# Patient Record
Sex: Female | Born: 1980
Health system: Southern US, Community
[De-identification: ages and names within clinical notes are randomized; demographics above are authoritative.]

## PROBLEM LIST (undated history)

## (undated) DIAGNOSIS — R7303 Prediabetes: Secondary | ICD-10-CM

## (undated) DIAGNOSIS — I10 Essential (primary) hypertension: Secondary | ICD-10-CM

## (undated) DIAGNOSIS — L732 Hidradenitis suppurativa: Secondary | ICD-10-CM

## (undated) DIAGNOSIS — J45909 Unspecified asthma, uncomplicated: Secondary | ICD-10-CM

## (undated) DIAGNOSIS — R12 Heartburn: Secondary | ICD-10-CM

## (undated) DIAGNOSIS — E669 Obesity, unspecified: Secondary | ICD-10-CM

## (undated) DIAGNOSIS — M545 Low back pain, unspecified: Secondary | ICD-10-CM

## (undated) DIAGNOSIS — M7989 Other specified soft tissue disorders: Secondary | ICD-10-CM

## (undated) HISTORY — DX: Obesity, unspecified: E66.9

## (undated) HISTORY — DX: Unspecified asthma, uncomplicated: J45.909

## (undated) HISTORY — DX: Prediabetes: R73.03

## (undated) HISTORY — DX: Other specified soft tissue disorders: M79.89

## (undated) HISTORY — DX: Heartburn: R12

## (undated) HISTORY — DX: Low back pain, unspecified: M54.50

## (undated) HISTORY — PX: OTHER SURGICAL HISTORY: SHX169

## (undated) HISTORY — DX: Essential (primary) hypertension: I10

## (undated) HISTORY — DX: Hidradenitis suppurativa: L73.2

---

## 2012-05-21 ENCOUNTER — Ambulatory Visit (INDEPENDENT_AMBULATORY_CARE_PROVIDER_SITE_OTHER): Payer: BC Managed Care – PPO | Admitting: Family

## 2012-05-21 ENCOUNTER — Encounter: Payer: Self-pay | Admitting: Family

## 2012-05-21 VITALS — BP 136/86 | HR 92 | Temp 98.2°F | Resp 16 | Ht 69.0 in | Wt 282.1 lb

## 2012-05-21 DIAGNOSIS — I10 Essential (primary) hypertension: Secondary | ICD-10-CM

## 2012-05-21 DIAGNOSIS — Z23 Encounter for immunization: Secondary | ICD-10-CM

## 2012-05-21 LAB — BASIC METABOLIC PANEL
CO2: 25 mEq/L (ref 19–32)
Chloride: 105 mEq/L (ref 96–112)
Glucose, Bld: 80 mg/dL (ref 70–99)
Potassium: 4.2 mEq/L (ref 3.5–5.3)
Sodium: 139 mEq/L (ref 135–145)

## 2012-05-21 NOTE — Progress Notes (Signed)
  Subjective:    Patient ID: Amy Sutton, female    DOB: 11/12/1980, 31 y.o.   MRN: [id number]  HPI  [name] is a 31 yr old female who presents today to establish care.  She is the daughter of Amy Sutton who is a patient of mine.  She would like to discuss her HTN.  She reports that she has been following at the [facility] clinic for htn and is maintained on lisinopril-hctz. She was diagnosed with hypertension a few years ago.  Denies associated CP/SOB/Swelling. Review of Systems  Constitutional: Negative for unexpected weight change.  HENT: Positive for rhinorrhea.   Eyes: Positive for itching.  Respiratory: Negative for cough and shortness of breath.   Cardiovascular: Negative for chest pain.  Gastrointestinal: Negative for nausea, vomiting and diarrhea.  Genitourinary: Negative for urgency and frequency.       Last period was April of this year.  Has implanon.  Musculoskeletal: Negative for joint swelling and arthralgias.  Skin: Negative for rash.  Neurological: Negative for headaches.  Hematological: Negative for adenopathy.  Psychiatric/Behavioral:       Denies depression/anxiety   Past Medical History  Diagnosis Date  . Hypertension     History   Social History  . Marital Status: Divorced    Spouse Name: N/A    Number of Children: 1  . Years of Education: N/A   Occupational History  .     Social History Main Topics  . Smoking status: Never Smoker   . Smokeless tobacco: Never Used  . Alcohol Use: 1.5 oz/week    3 drink(s) per week  . Drug Use: Not on file  . Sexually Active: Not on file   Other Topics Concern  . Not on file   Social History Narrative   Regular exercise:  Not currentlyCaffeine Use:  Sometimes7 yr old sonStudent at [employer].Lives with parents and son    History reviewed. No pertinent past surgical history.  Family History  Problem Relation Age of Onset  . Arthritis Mother   . Hypertension Mother   . Diabetes Mother   .  Hypertension Father   . Diabetes Father   . Cancer Maternal Uncle     prostate  . Cancer Paternal Aunt     No Known Allergies  Current Outpatient Prescriptions on File Prior to Visit  Medication Sig Dispense Refill  . lisinopril-hydrochlorothiazide (PRINZIDE,ZESTORETIC) 20-12.5 MG per tablet Take 2 tablets by mouth every morning.        BP 136/86  Pulse 92  Temp 98.2 F (36.8 C) (Oral)  Resp 16  Ht 5\' 9"  (1.753 m)  Wt 282 lb 2.1 oz (127.973 kg)  BMI 41.66 kg/m2  SpO2 99%        Objective:   Physical Exam  Constitutional: She appears well-developed and well-nourished. No distress.  HENT:  Head: Normocephalic and atraumatic.  Right Ear: Tympanic membrane and ear canal normal.  Left Ear: Tympanic membrane and ear canal normal.  Mouth/Throat: No posterior oropharyngeal edema or posterior oropharyngeal erythema.  Eyes: Conjunctivae normal are normal.  Cardiovascular: Normal rate and regular rhythm.   No murmur heard. Pulmonary/Chest: Effort normal and breath sounds normal.  Lymphadenopathy:    She has no cervical adenopathy.  Psychiatric: She has a normal mood and affect. Her behavior is normal. Judgment and thought content normal.          Assessment & Plan:

## 2012-05-21 NOTE — Patient Instructions (Addendum)
Please complete your lab work prior to leaving.  Schedule a fasting physical at the front desk.   Welcome to [facility]!

## 2012-05-22 ENCOUNTER — Encounter: Payer: Self-pay | Admitting: Family

## 2012-05-22 DIAGNOSIS — I10 Essential (primary) hypertension: Secondary | ICD-10-CM | POA: Insufficient documentation

## 2012-05-22 NOTE — Assessment & Plan Note (Signed)
Stable on lisinopril HCTZ.  Continue same, obtain bmet.

## 2012-05-24 ENCOUNTER — Telehealth: Payer: Self-pay | Admitting: Family

## 2012-05-24 NOTE — Telephone Encounter (Signed)
Received medical records from [facility]  P: [phone] F: [phone]

## 2012-05-30 ENCOUNTER — Encounter: Payer: Self-pay | Admitting: Family

## 2012-05-30 ENCOUNTER — Other Ambulatory Visit (HOSPITAL_COMMUNITY)
Admission: RE | Admit: 2012-05-30 | Discharge: 2012-05-30 | Disposition: A | Discharge status: Home / Self Care | Payer: BC Managed Care – PPO | Source: Ambulatory Visit | Attending: Family | Admitting: Family

## 2012-05-30 ENCOUNTER — Ambulatory Visit (INDEPENDENT_AMBULATORY_CARE_PROVIDER_SITE_OTHER): Payer: BC Managed Care – PPO | Admitting: Family

## 2012-05-30 VITALS — BP 118/74 | HR 94 | Temp 99.6°F | Resp 16 | Ht 69.0 in | Wt 279.1 lb

## 2012-05-30 DIAGNOSIS — Z0000 Encounter for general adult medical examination without abnormal findings: Secondary | ICD-10-CM | POA: Insufficient documentation

## 2012-05-30 DIAGNOSIS — Z01419 Encounter for gynecological examination (general) (routine) without abnormal findings: Secondary | ICD-10-CM | POA: Insufficient documentation

## 2012-05-30 LAB — CBC WITH DIFFERENTIAL/PLATELET
Basophils Absolute: 0 10*3/uL (ref 0.0–0.1)
Basophils Relative: 0 % (ref 0–1)
Eosinophils Relative: 1 % (ref 0–5)
HCT: 39.8 % (ref 36.0–46.0)
Hemoglobin: 13.3 g/dL (ref 12.0–15.0)
MCHC: 33.4 g/dL (ref 30.0–36.0)
MCV: 79.6 fL (ref 78.0–100.0)
Monocytes Absolute: 0.4 10*3/uL (ref 0.1–1.0)
Monocytes Relative: 6 % (ref 3–12)
RDW: 13.6 % (ref 11.5–15.5)

## 2012-05-30 LAB — LIPID PANEL
Cholesterol: 178 mg/dL (ref 0–200)
Total CHOL/HDL Ratio: 4.6 Ratio
VLDL: 23 mg/dL (ref 0–40)

## 2012-05-30 LAB — HEPATIC FUNCTION PANEL
ALT: 12 U/L (ref 0–35)
AST: 12 U/L (ref 0–37)
Bilirubin, Direct: 0.1 mg/dL (ref 0.0–0.3)
Indirect Bilirubin: 0.4 mg/dL (ref 0.0–0.9)
Total Protein: 7.2 g/dL (ref 6.0–8.3)

## 2012-05-30 NOTE — Addendum Note (Signed)
Addended by: Mervin Kung A on: 05/30/2012 12:09 PM   Modules accepted: Orders

## 2012-05-30 NOTE — Progress Notes (Signed)
Subjective:    Patient ID: Amy Sutton, female    DOB: 11/25/1980, 31 y.o.   MRN: [id number]  HPI Patient presents today for complete physical.  Immunizations: Tdap/flu shot up to date Diet: Trying to eat healthy. Exercise: Not regularly.   Pap Smear: last pap 2011- regular pap smears- reports always normal.  Last period in April- irregular menses due to implanon.   Review of Systems  Constitutional: Negative for unexpected weight change.  HENT: Negative for hearing loss.   Eyes: Negative for visual disturbance.  Respiratory: Negative for shortness of breath and wheezing.   Cardiovascular: Negative for chest pain.  Gastrointestinal: Positive for constipation. Negative for nausea, vomiting and diarrhea.  Genitourinary: Negative for dysuria and frequency.  Musculoskeletal: Negative for myalgias and arthralgias.  Skin: Negative for rash.  Neurological: Negative for headaches.  Hematological: Negative for adenopathy.  Psychiatric/Behavioral:       Denies depression/anxiety       Past Medical History  Diagnosis Date  . Hypertension     History   Social History  . Marital Status: Divorced    Spouse Name: N/A    Number of Children: 1  . Years of Education: N/A   Occupational History  .     Social History Main Topics  . Smoking status: Never Smoker   . Smokeless tobacco: Never Used  . Alcohol Use: 1.5 oz/week    3 drink(s) per week  . Drug Use: Not on file  . Sexually Active: Not on file   Other Topics Concern  . Not on file   Social History Narrative   Regular exercise:  Not currentlyCaffeine Use:  Sometimes7 yr old sonStudent at [employer].Lives with parents and son    No past surgical history on file.  Family History  Problem Relation Age of Onset  . Arthritis Mother   . Hypertension Mother   . Diabetes Mother   . Hypertension Father   . Diabetes Father   . Cancer Maternal Uncle     prostate  . Cancer Paternal Aunt     No Known  Allergies  Current Outpatient Prescriptions on File Prior to Visit  Medication Sig Dispense Refill  . etonogestrel (IMPLANON) 68 MG IMPL implant Inject 1 each into the skin once. Per GYN      . lisinopril-hydrochlorothiazide (PRINZIDE,ZESTORETIC) 20-12.5 MG per tablet Take 2 tablets by mouth every morning.        BP 118/74  Pulse 94  Temp 99.6 F (37.6 C) (Oral)  Resp 16  Ht 5\' 9"  (1.753 m)  Wt 279 lb 1.3 oz (126.59 kg)  BMI 41.21 kg/m2  SpO2 96%    Objective:   Physical Exam Physical Exam  Constitutional: She is oriented to person, place, and time. She appears well-developed and well-nourished. No distress.  HENT:  Head: Normocephalic and atraumatic.  Right Ear: Tympanic membrane and ear canal normal.  Left Ear: Tympanic membrane and ear canal normal.  Mouth/Throat: Oropharynx is clear and moist.  Eyes: Pupils are equal, round, and reactive to light. No scleral icterus.  Neck: Normal range of motion. No thyromegaly present.  Cardiovascular: Normal rate and regular rhythm.   No murmur heard. Pulmonary/Chest: Effort normal and breath sounds normal. No respiratory distress. He has no wheezes. She has no rales. She exhibits no tenderness.  Abdominal: Soft. Bowel sounds are normal. He exhibits no distension and no mass. There is no tenderness. There is no rebound and no guarding.  Musculoskeletal: She exhibits no edema.  Lymphadenopathy:    She has no cervical adenopathy.  Neurological: She is alert and oriented to person, place, and time. She has normal reflexes. She exhibits normal muscle tone. Coordination normal.  Skin: Skin is warm and dry.  Psychiatric: She has a normal mood and affect. Her behavior is normal. Judgment and thought content normal.  Breasts: Examined lying Right: Without masses, retractions, discharge or axillary adenopathy.  Left: Without masses, retractions, discharge or axillary adenopathy.  Inguinal/mons: Normal without inguinal adenopathy  External  genitalia: Normal  BUS/Urethra/Skene's glands: Normal  Bladder: Normal  Vagina: Normal  Cervix: Normal  Uterus: normal in size, shape and contour. Midline and mobile  Adnexa/parametria:  Rt: Without masses or tenderness.  Lt: Without masses or tenderness.  Anus and perineum: Normal           Assessment & Plan:          Assessment & Plan:

## 2012-05-30 NOTE — Assessment & Plan Note (Signed)
Pt counseled on healthy diet, exercise.  Obtain fasting labs.  Pap performed.

## 2012-05-30 NOTE — Patient Instructions (Addendum)
Preventive Care for Adults, Female A healthy lifestyle and preventive care can promote health and wellness. Preventive health guidelines for women include the following key practices.  A routine yearly physical is a good way to check with your caregiver about your health and preventive screening. It is a chance to share any concerns and updates on your health, and to receive a thorough exam.   Visit your dentist for a routine exam and preventive care every 6 months. Brush your teeth twice a day and floss once a day. Good oral hygiene prevents tooth decay and gum disease.   The frequency of eye exams is based on your age, health, family medical history, use of contact lenses, and other factors. Follow your caregiver's recommendations for frequency of eye exams.   Eat a healthy diet. Foods like vegetables, fruits, whole grains, low-fat dairy products, and lean protein foods contain the nutrients you need without too many calories. Decrease your intake of foods high in solid fats, added sugars, and salt. Eat the right amount of calories for you.Get information about a proper diet from your caregiver, if necessary.   Regular physical exercise is one of the most important things you can do for your health. Most adults should get at least 150 minutes of moderate-intensity exercise (any activity that increases your heart rate and causes you to sweat) each week. In addition, most adults need muscle-strengthening exercises on 2 or more days a week.   Maintain a healthy weight. The body mass index (BMI) is a screening tool to identify possible weight problems. It provides an estimate of body fat based on height and weight. Your caregiver can help determine your BMI, and can help you achieve or maintain a healthy weight.For adults 20 years and older:   A BMI below 18.5 is considered underweight.   A BMI of 18.5 to 24.9 is normal.   A BMI of 25 to 29.9 is considered overweight.   A BMI of 30 and above is  considered obese.   Maintain normal blood lipids and cholesterol levels by exercising and minimizing your intake of saturated fat. Eat a balanced diet with plenty of fruit and vegetables. Blood tests for lipids and cholesterol should begin at age 20 and be repeated every 5 years. If your lipid or cholesterol levels are high, you are over 50, or you are at high risk for heart disease, you may need your cholesterol levels checked more frequently.Ongoing high lipid and cholesterol levels should be treated with medicines if diet and exercise are not effective.   If you smoke, find out from your caregiver how to quit. If you do not use tobacco, do not start.   If you are pregnant, do not drink alcohol. If you are breastfeeding, be very cautious about drinking alcohol. If you are not pregnant and choose to drink alcohol, do not exceed 1 drink per day. One drink is considered to be 12 ounces (355 mL) of beer, 5 ounces (148 mL) of wine, or 1.5 ounces (44 mL) of liquor.   Avoid use of street drugs. Do not share needles with anyone. Ask for help if you need support or instructions about stopping the use of drugs.   High blood pressure causes heart disease and increases the risk of stroke. Your blood pressure should be checked at least every 1 to 2 years. Ongoing high blood pressure should be treated with medicines if weight loss and exercise are not effective.   If you are 55 to 30   years old, ask your caregiver if you should take aspirin to prevent strokes.   Diabetes screening involves taking a blood sample to check your fasting blood sugar level. This should be done once every 3 years, after age 45, if you are within normal weight and without risk factors for diabetes. Testing should be considered at a younger age or be carried out more frequently if you are overweight and have at least 1 risk factor for diabetes.   Breast cancer screening is essential preventive care for women. You should practice "breast  self-awareness." This means understanding the normal appearance and feel of your breasts and may include breast self-examination. Any changes detected, no matter how small, should be reported to a caregiver. Women in their 20s and 30s should have a clinical breast exam (CBE) by a caregiver as part of a regular health exam every 1 to 3 years. After age 40, women should have a CBE every year. Starting at age 40, women should consider having a mammography (breast X-ray test) every year. Women who have a family history of breast cancer should talk to their caregiver about genetic screening. Women at a high risk of breast cancer should talk to their caregivers about having magnetic resonance imaging (MRI) and a mammography every year.   The Pap test is a screening test for cervical cancer. A Pap test can show cell changes on the cervix that might become cervical cancer if left untreated. A Pap test is a procedure in which cells are obtained and examined from the lower end of the uterus (cervix).   Women should have a Pap test starting at age 21.   Between ages 21 and 29, Pap tests should be repeated every 2 years.   Beginning at age 30, you should have a Pap test every 3 years as long as the past 3 Pap tests have been normal.   Some women have medical problems that increase the chance of getting cervical cancer. Talk to your caregiver about these problems. It is especially important to talk to your caregiver if a new problem develops soon after your last Pap test. In these cases, your caregiver may recommend more frequent screening and Pap tests.   The above recommendations are the same for women who have or have not gotten the vaccine for human papillomavirus (HPV).   If you had a hysterectomy for a problem that was not cancer or a condition that could lead to cancer, then you no longer need Pap tests. Even if you no longer need a Pap test, a regular exam is a good idea to make sure no other problems are  starting.   If you are between ages 65 and 70, and you have had normal Pap tests going back 10 years, you no longer need Pap tests. Even if you no longer need a Pap test, a regular exam is a good idea to make sure no other problems are starting.   If you have had past treatment for cervical cancer or a condition that could lead to cancer, you need Pap tests and screening for cancer for at least 20 years after your treatment.   If Pap tests have been discontinued, risk factors (such as a new sexual partner) need to be reassessed to determine if screening should be resumed.   The HPV test is an additional test that may be used for cervical cancer screening. The HPV test looks for the virus that can cause the cell changes on the cervix.   The cells collected during the Pap test can be tested for HPV. The HPV test could be used to screen women aged 30 years and older, and should be used in women of any age who have unclear Pap test results. After the age of 30, women should have HPV testing at the same frequency as a Pap test.   Colorectal cancer can be detected and often prevented. Most routine colorectal cancer screening begins at the age of 50 and continues through age 75. However, your caregiver may recommend screening at an earlier age if you have risk factors for colon cancer. On a yearly basis, your caregiver may provide home test kits to check for hidden blood in the stool. Use of a small camera at the end of a tube, to directly examine the colon (sigmoidoscopy or colonoscopy), can detect the earliest forms of colorectal cancer. Talk to your caregiver about this at age 50, when routine screening begins. Direct examination of the colon should be repeated every 5 to 10 years through age 75, unless early forms of pre-cancerous polyps or small growths are found.   Hepatitis C blood testing is recommended for all people born from 1945 through 1965 and any individual with known risks for hepatitis C.    Practice safe sex. Use condoms and avoid high-risk sexual practices to reduce the spread of sexually transmitted infections (STIs). STIs include gonorrhea, chlamydia, syphilis, trichomonas, herpes, HPV, and human immunodeficiency virus (HIV). Herpes, HIV, and HPV are viral illnesses that have no cure. They can result in disability, cancer, and death. Sexually active women aged 25 and younger should be checked for chlamydia. Older women with new or multiple partners should also be tested for chlamydia. Testing for other STIs is recommended if you are sexually active and at increased risk.   Osteoporosis is a disease in which the bones lose minerals and strength with aging. This can result in serious bone fractures. The risk of osteoporosis can be identified using a bone density scan. Women ages 65 and over and women at risk for fractures or osteoporosis should discuss screening with their caregivers. Ask your caregiver whether you should take a calcium supplement or vitamin D to reduce the rate of osteoporosis.   Menopause can be associated with physical symptoms and risks. Hormone replacement therapy is available to decrease symptoms and risks. You should talk to your caregiver about whether hormone replacement therapy is right for you.   Use sunscreen with sun protection factor (SPF) of 30 or more. Apply sunscreen liberally and repeatedly throughout the day. You should seek shade when your shadow is shorter than you. Protect yourself by wearing long sleeves, pants, a wide-brimmed hat, and sunglasses year round, whenever you are outdoors.   Once a month, do a whole body skin exam, using a mirror to look at the skin on your back. Notify your caregiver of new moles, moles that have irregular borders, moles that are larger than a pencil eraser, or moles that have changed in shape or color.   Stay current with required immunizations.   Influenza. You need a dose every fall (or winter). The composition of  the flu vaccine changes each year, so being vaccinated once is not enough.   Pneumococcal polysaccharide. You need 1 to 2 doses if you smoke cigarettes or if you have certain chronic medical conditions. You need 1 dose at age 65 (or older) if you have never been vaccinated.   Tetanus, diphtheria, pertussis (Tdap, Td). Get 1 dose of   Tdap vaccine if you are younger than age 65, are over 65 and have contact with an infant, are a healthcare worker, are pregnant, or simply want to be protected from whooping cough. After that, you need a Td booster dose every 10 years. Consult your caregiver if you have not had at least 3 tetanus and diphtheria-containing shots sometime in your life or have a deep or dirty wound.   HPV. You need this vaccine if you are a woman age 26 or younger. The vaccine is given in 3 doses over 6 months.   Measles, mumps, rubella (MMR). You need at least 1 dose of MMR if you were born in 1957 or later. You may also need a second dose.   Meningococcal. If you are age 19 to 21 and a first-year college student living in a residence hall, or have one of several medical conditions, you need to get vaccinated against meningococcal disease. You may also need additional booster doses.   Zoster (shingles). If you are age 60 or older, you should get this vaccine.   Varicella (chickenpox). If you have never had chickenpox or you were vaccinated but received only 1 dose, talk to your caregiver to find out if you need this vaccine.   Hepatitis A. You need this vaccine if you have a specific risk factor for hepatitis A virus infection or you simply wish to be protected from this disease. The vaccine is usually given as 2 doses, 6 to 18 months apart.   Hepatitis B. You need this vaccine if you have a specific risk factor for hepatitis B virus infection or you simply wish to be protected from this disease. The vaccine is given in 3 doses, usually over 6 months.  Preventive Services /  Frequency Ages 19 to 39  Blood pressure check.** / Every 1 to 2 years.   Lipid and cholesterol check.** / Every 5 years beginning at age 20.   Clinical breast exam.** / Every 3 years for women in their 20s and 30s.   Pap test.** / Every 2 years from ages 21 through 29. Every 3 years starting at age 30 through age 65 or 70 with a history of 3 consecutive normal Pap tests.   HPV screening.** / Every 3 years from ages 30 through ages 65 to 70 with a history of 3 consecutive normal Pap tests.   Hepatitis C blood test.** / For any individual with known risks for hepatitis C.   Skin self-exam. / Monthly.   Influenza immunization.** / Every year.   Pneumococcal polysaccharide immunization.** / 1 to 2 doses if you smoke cigarettes or if you have certain chronic medical conditions.   Tetanus, diphtheria, pertussis (Tdap, Td) immunization. / A one-time dose of Tdap vaccine. After that, you need a Td booster dose every 10 years.   HPV immunization. / 3 doses over 6 months, if you are 26 and younger.   Measles, mumps, rubella (MMR) immunization. / You need at least 1 dose of MMR if you were born in 1957 or later. You may also need a second dose.   Meningococcal immunization. / 1 dose if you are age 19 to 21 and a first-year college student living in a residence hall, or have one of several medical conditions, you need to get vaccinated against meningococcal disease. You may also need additional booster doses.   Varicella immunization.** / Consult your caregiver.   Hepatitis A immunization.** / Consult your caregiver. 2 doses, 6 to 18 months   apart.   Hepatitis B immunization.** / Consult your caregiver. 3 doses usually over 6 months.  Ages 40 to 64  Blood pressure check.** / Every 1 to 2 years.   Lipid and cholesterol check.** / Every 5 years beginning at age 20.   Clinical breast exam.** / Every year after age 40.   Mammogram.** / Every year beginning at age 40 and continuing for as  long as you are in good health. Consult with your caregiver.   Pap test.** / Every 3 years starting at age 30 through age 65 or 70 with a history of 3 consecutive normal Pap tests.   HPV screening.** / Every 3 years from ages 30 through ages 65 to 70 with a history of 3 consecutive normal Pap tests.   Fecal occult blood test (FOBT) of stool. / Every year beginning at age 50 and continuing until age 75. You may not need to do this test if you get a colonoscopy every 10 years.   Flexible sigmoidoscopy or colonoscopy.** / Every 5 years for a flexible sigmoidoscopy or every 10 years for a colonoscopy beginning at age 50 and continuing until age 75.   Hepatitis C blood test.** / For all people born from 1945 through 1965 and any individual with known risks for hepatitis C.   Skin self-exam. / Monthly.   Influenza immunization.** / Every year.   Pneumococcal polysaccharide immunization.** / 1 to 2 doses if you smoke cigarettes or if you have certain chronic medical conditions.   Tetanus, diphtheria, pertussis (Tdap, Td) immunization.** / A one-time dose of Tdap vaccine. After that, you need a Td booster dose every 10 years.   Measles, mumps, rubella (MMR) immunization. / You need at least 1 dose of MMR if you were born in 1957 or later. You may also need a second dose.   Varicella immunization.** / Consult your caregiver.   Meningococcal immunization.** / Consult your caregiver.   Hepatitis A immunization.** / Consult your caregiver. 2 doses, 6 to 18 months apart.   Hepatitis B immunization.** / Consult your caregiver. 3 doses, usually over 6 months.  Ages 65 and over  Blood pressure check.** / Every 1 to 2 years.   Lipid and cholesterol check.** / Every 5 years beginning at age 20.   Clinical breast exam.** / Every year after age 40.   Mammogram.** / Every year beginning at age 40 and continuing for as long as you are in good health. Consult with your caregiver.   Pap test.** /  Every 3 years starting at age 30 through age 65 or 70 with a 3 consecutive normal Pap tests. Testing can be stopped between 65 and 70 with 3 consecutive normal Pap tests and no abnormal Pap or HPV tests in the past 10 years.   HPV screening.** / Every 3 years from ages 30 through ages 65 or 70 with a history of 3 consecutive normal Pap tests. Testing can be stopped between 65 and 70 with 3 consecutive normal Pap tests and no abnormal Pap or HPV tests in the past 10 years.   Fecal occult blood test (FOBT) of stool. / Every year beginning at age 50 and continuing until age 75. You may not need to do this test if you get a colonoscopy every 10 years.   Flexible sigmoidoscopy or colonoscopy.** / Every 5 years for a flexible sigmoidoscopy or every 10 years for a colonoscopy beginning at age 50 and continuing until age 75.   Hepatitis   C blood test.** / For all people born from 1945 through 1965 and any individual with known risks for hepatitis C.   Osteoporosis screening.** / A one-time screening for women ages 65 and over and women at risk for fractures or osteoporosis.   Skin self-exam. / Monthly.   Influenza immunization.** / Every year.   Pneumococcal polysaccharide immunization.** / 1 dose at age 65 (or older) if you have never been vaccinated.   Tetanus, diphtheria, pertussis (Tdap, Td) immunization. / A one-time dose of Tdap vaccine if you are over 65 and have contact with an infant, are a healthcare worker, or simply want to be protected from whooping cough. After that, you need a Td booster dose every 10 years.   Varicella immunization.** / Consult your caregiver.   Meningococcal immunization.** / Consult your caregiver.   Hepatitis A immunization.** / Consult your caregiver. 2 doses, 6 to 18 months apart.   Hepatitis B immunization.** / Check with your caregiver. 3 doses, usually over 6 months.  ** Family history and personal history of risk and conditions may change your caregiver's  recommendations. Document Released: 10/18/2001 Document Revised: 08/11/2011 Document Reviewed: 01/17/2011 [employer] Patient Information 2012 [employer]. 

## 2012-05-31 ENCOUNTER — Encounter: Payer: Self-pay | Admitting: Family

## 2012-05-31 LAB — URINALYSIS, ROUTINE W REFLEX MICROSCOPIC
Bilirubin Urine: NEGATIVE
Leukocytes, UA: NEGATIVE
Protein, ur: NEGATIVE mg/dL
Specific Gravity, Urine: 1.017 (ref 1.005–1.030)
Urobilinogen, UA: 1 mg/dL (ref 0.0–1.0)

## 2012-05-31 LAB — TSH: TSH: 1.302 u[IU]/mL (ref 0.350–4.500)

## 2012-09-06 ENCOUNTER — Other Ambulatory Visit: Payer: Self-pay | Admitting: *Deleted

## 2012-09-06 MED ORDER — LISINOPRIL-HYDROCHLOROTHIAZIDE 20-12.5 MG PO TABS: 2.0000 | ORAL_TABLET | ORAL | Status: DC

## 2012-09-06 NOTE — Telephone Encounter (Signed)
Received message from pt requesting refill of lisinopril hctz. Refills sent and detailed message left on pt's cell#.

## 2012-10-08 ENCOUNTER — Ambulatory Visit (INDEPENDENT_AMBULATORY_CARE_PROVIDER_SITE_OTHER): Payer: BC Managed Care – PPO | Admitting: Family

## 2012-10-08 ENCOUNTER — Encounter: Payer: Self-pay | Admitting: Family

## 2012-10-08 VITALS — BP 120/76 | HR 106 | Temp 99.3°F | Resp 16 | Wt 287.1 lb

## 2012-10-08 DIAGNOSIS — S335XXA Sprain of ligaments of lumbar spine, initial encounter: Secondary | ICD-10-CM

## 2012-10-08 DIAGNOSIS — M545 Low back pain, unspecified: Secondary | ICD-10-CM | POA: Insufficient documentation

## 2012-10-08 DIAGNOSIS — G8929 Other chronic pain: Secondary | ICD-10-CM | POA: Insufficient documentation

## 2012-10-08 DIAGNOSIS — L732 Hidradenitis suppurativa: Secondary | ICD-10-CM

## 2012-10-08 DIAGNOSIS — S39012A Strain of muscle, fascia and tendon of lower back, initial encounter: Secondary | ICD-10-CM

## 2012-10-08 MED ORDER — MELOXICAM 7.5 MG PO TABS: 7.5000 mg | ORAL_TABLET | Freq: Every day | ORAL | Status: DC

## 2012-10-08 MED ORDER — CYCLOBENZAPRINE HCL 5 MG PO TABS: ORAL_TABLET | ORAL | Status: DC

## 2012-10-08 NOTE — Assessment & Plan Note (Signed)
Trial of meloxicam and HS flexeril.  Pt is instructed to contact us if symptoms worsen, if she develops leg weakness or if not improved in 1 month.

## 2012-10-08 NOTE — Assessment & Plan Note (Signed)
Currently stable. We discussed that there is no cure. She can apply warm compresses/take tubs as needed to promote drainage of cysts.  If cysts become sore/tender or enlarged she is instructed to be seen in our office as she may require abx.

## 2012-10-08 NOTE — Patient Instructions (Addendum)

## 2012-10-08 NOTE — Progress Notes (Signed)
  Subjective:    Patient ID: Amy Sutton, female    DOB: 1981-08-11, 32 y.o.   MRN: [id number]  HPI  Amy Sutton is a 32 yr old female who presents today with chief complaint of low back pain.  Pain is lon the left side.  Pain occurs after walking and has been present for approximately 1 month. Pain is worse with movement.  Occasionally radiates down the left leg, but generally, "stays in my back."  Recurrent skin infections- reports that she had a boil in the right groin a few days ago but it has now resolved. She reports that she is prone to boils in the groin and the axilla and wonders what she can do to prevent them.   Review of Systems See HPI  Past Medical History  Diagnosis Date  . Hypertension     History   Social History  . Marital Status: Divorced    Spouse Name: N/A    Number of Children: 1  . Years of Education: N/A   Occupational History  .     Social History Main Topics  . Smoking status: Never Smoker   . Smokeless tobacco: Never Used  . Alcohol Use: 1.5 oz/week    3 drink(s) per week  . Drug Use: Not on file  . Sexually Active: Not on file   Other Topics Concern  . Not on file   Social History Narrative   Regular exercise:  Not currentlyCaffeine Use:  Sometimes7 yr old sonStudent at [employer].Lives with parents and son    No past surgical history on file.  Family History  Problem Relation Age of Onset  . Arthritis Mother   . Hypertension Mother   . Diabetes Mother   . Hypertension Father   . Diabetes Father   . Cancer Maternal Uncle     prostate  . Cancer Paternal Aunt     No Known Allergies  Current Outpatient Prescriptions on File Prior to Visit  Medication Sig Dispense Refill  . etonogestrel (IMPLANON) 68 MG IMPL implant Inject 1 each into the skin once. Per GYN      . lisinopril-hydrochlorothiazide (PRINZIDE,ZESTORETIC) 20-12.5 MG per tablet Take 2 tablets by mouth every morning.  60 tablet  2    BP 120/76  Pulse 106   Temp 99.3 F (37.4 C) (Oral)  Resp 16  Wt 287 lb 1.9 oz (130.237 kg)  SpO2 99%       Objective:   Physical Exam  Constitutional: She is oriented to person, place, and time. She appears well-developed and well-nourished. No distress.  HENT:  Head: Normocephalic and atraumatic.  Cardiovascular: Normal rate and regular rhythm.   No murmur heard. Pulmonary/Chest: Effort normal and breath sounds normal. No respiratory distress. She has no wheezes. She has no rales. She exhibits no tenderness.  Musculoskeletal:       Thoracic back: She exhibits no tenderness.       Lumbar back: She exhibits no tenderness.       Increased low back pain with left straight leg raise.   Lymphadenopathy:    She has no cervical adenopathy.  Neurological: She is alert and oriented to person, place, and time.  Psychiatric: She has a normal mood and affect. Her behavior is normal. Judgment and thought content normal.          Assessment & Plan:

## 2012-11-19 ENCOUNTER — Ambulatory Visit: Payer: BC Managed Care – PPO | Admitting: Family

## 2012-12-05 ENCOUNTER — Encounter: Payer: Self-pay | Admitting: Family

## 2012-12-05 ENCOUNTER — Ambulatory Visit (INDEPENDENT_AMBULATORY_CARE_PROVIDER_SITE_OTHER): Payer: BC Managed Care – PPO | Admitting: Family

## 2012-12-05 ENCOUNTER — Ambulatory Visit (HOSPITAL_BASED_OUTPATIENT_CLINIC_OR_DEPARTMENT_OTHER)
Admission: RE | Admit: 2012-12-05 | Discharge: 2012-12-05 | Disposition: A | Discharge status: Home / Self Care | Payer: BC Managed Care – PPO | Source: Ambulatory Visit | Attending: Family | Admitting: Family

## 2012-12-05 VITALS — BP 130/80 | HR 104 | Temp 98.0°F | Resp 16 | Ht 69.0 in | Wt 287.0 lb

## 2012-12-05 DIAGNOSIS — M545 Low back pain, unspecified: Secondary | ICD-10-CM

## 2012-12-05 DIAGNOSIS — M4120 Other idiopathic scoliosis, site unspecified: Secondary | ICD-10-CM | POA: Insufficient documentation

## 2012-12-05 DIAGNOSIS — M79605 Pain in left leg: Secondary | ICD-10-CM

## 2012-12-05 DIAGNOSIS — M51379 Other intervertebral disc degeneration, lumbosacral region without mention of lumbar back pain or lower extremity pain: Secondary | ICD-10-CM | POA: Insufficient documentation

## 2012-12-05 DIAGNOSIS — M5137 Other intervertebral disc degeneration, lumbosacral region: Secondary | ICD-10-CM | POA: Insufficient documentation

## 2012-12-05 DIAGNOSIS — I10 Essential (primary) hypertension: Secondary | ICD-10-CM

## 2012-12-05 LAB — BASIC METABOLIC PANEL
BUN: 9 mg/dL (ref 6–23)
CO2: 25 mEq/L (ref 19–32)
Calcium: 9.7 mg/dL (ref 8.4–10.5)
Chloride: 106 mEq/L (ref 96–112)
Creat: 0.67 mg/dL (ref 0.50–1.10)
Glucose, Bld: 93 mg/dL (ref 70–99)
Potassium: 4.1 mEq/L (ref 3.5–5.3)
Sodium: 140 mEq/L (ref 135–145)

## 2012-12-05 MED ORDER — HYDROCHLOROTHIAZIDE 12.5 MG PO CAPS: 12.5000 mg | ORAL_CAPSULE | Freq: Every day | ORAL | Status: DC

## 2012-12-05 MED ORDER — AMLODIPINE BESYLATE 5 MG PO TABS: 5.0000 mg | ORAL_TABLET | Freq: Every day | ORAL | Status: DC

## 2012-12-05 MED ORDER — METHYLPREDNISOLONE 4 MG PO KIT: PACK | ORAL | Status: DC

## 2012-12-05 NOTE — Assessment & Plan Note (Signed)
Unchanged. Will give trial of medrol dose pak and also plan to check an x ray of lumbar spine.  If no improvement with medrol dose pak, consider MRI of the L spine.

## 2012-12-05 NOTE — Patient Instructions (Addendum)
Please call if your symptoms worsen or if not improved in 1 week.  Follow up in 1 month.

## 2012-12-05 NOTE — Assessment & Plan Note (Signed)
BP is well controlled. She wishes to discontinue ACE due to cough. Will switch to amlodipine. Continue hctz 12.5. Follow up in 1 month, obtain bmet today.

## 2012-12-05 NOTE — Progress Notes (Signed)
Subjective:    Patient ID: Amy Sutton, female    DOB: Oct 20, 1980, 32 y.o.   MRN: [id number]  HPI  Amy Sutton is a 32 yr old female who presents today for follow up.  1) HTN- She continues lisinopril-HCTZ-  She has an ace cough.  Find this bothersome.   2) Low back pain- seen 2 months ago.  Meloxicam did not help, flexeril helped her to sleep at night.  Pain radiates down the left leg. She denies associated weakness in the left leg, but dose have some tingling down the back of the left leg.  Symptoms started 3 months ago.      Review of Systems See HPI  Past Medical History  Diagnosis Date  . Hypertension     History   Social History  . Marital Status: Divorced    Spouse Name: N/A    Number of Children: 1  . Years of Education: N/A   Occupational History  .     Social History Main Topics  . Smoking status: Never Smoker   . Smokeless tobacco: Never Used  . Alcohol Use: 1.5 oz/week    3 drink(s) per week  . Drug Use: Not on file  . Sexually Active: Not on file   Other Topics Concern  . Not on file   Social History Narrative   Regular exercise:  Not currently   Caffeine Use:  Sometimes   27 yr old son   Consulting civil engineer at [employer].   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  Family History  Problem Relation Age of Onset  . Arthritis Mother   . Hypertension Mother   . Diabetes Mother   . Hypertension Father   . Diabetes Father   . Cancer Maternal Uncle     prostate  . Cancer Paternal Aunt     No Known Allergies  Current Outpatient Prescriptions on File Prior to Visit  Medication Sig Dispense Refill  . etonogestrel (IMPLANON) 68 MG IMPL implant Inject 1 each into the skin once. Per GYN       No current facility-administered medications on file prior to visit.    BP 130/80  Pulse 104  Temp(Src) 98 F (36.7 C) (Oral)  Resp 16  Ht 5\' 9"  (1.753 m)  Wt 287 lb (130.182 kg)  BMI 42.36 kg/m2  SpO2  99%       Objective:   Physical Exam  Constitutional: She is oriented to person, place, and time. She appears well-developed. No distress.  HENT:  Head: Normocephalic and atraumatic.  Right Ear: Tympanic membrane and ear canal normal.  Left Ear: Tympanic membrane and ear canal normal.  Mouth/Throat: No oropharyngeal exudate, posterior oropharyngeal edema or posterior oropharyngeal erythema.  Eyes: Conjunctivae are normal.  Cardiovascular: Normal rate and regular rhythm.   No murmur heard. Pulmonary/Chest: Effort normal and breath sounds normal. No respiratory distress. She has no wheezes. She has no rales. She exhibits no tenderness.  Musculoskeletal: She exhibits no edema.       Lumbar back: She exhibits no tenderness.  Lymphadenopathy:    She has no cervical adenopathy.  Neurological: She is alert and oriented to person, place, and time.  Reflex Scores:      Patellar reflexes are 2+ on the right side and 2+ on the left side. Bilateral LE strength is 5/5  Skin: Skin is warm and dry.  Psychiatric: She has a normal mood and affect. Her behavior is normal. Judgment and thought content normal.          Assessment & Plan:

## 2012-12-06 ENCOUNTER — Encounter: Payer: Self-pay | Admitting: Family

## 2012-12-06 ENCOUNTER — Telehealth: Payer: Self-pay | Admitting: Family

## 2012-12-06 DIAGNOSIS — M549 Dorsalgia, unspecified: Secondary | ICD-10-CM

## 2012-12-06 NOTE — Telephone Encounter (Signed)
pls call pt and let her know that x ray shows scoliosis and mild degenerative changes in her lower spine. i recommend PT referral.

## 2012-12-07 NOTE — Telephone Encounter (Signed)
Notified pt. She has already been contacted by PT and will proceed.

## 2012-12-11 ENCOUNTER — Ambulatory Visit: Payer: BC Managed Care – PPO | Admitting: Rehabilitation

## 2012-12-19 ENCOUNTER — Ambulatory Visit: Payer: BC Managed Care – PPO | Admitting: Rehabilitation

## 2012-12-24 ENCOUNTER — Ambulatory Visit: Payer: BC Managed Care – PPO | Attending: Family | Admitting: Rehabilitation

## 2012-12-24 DIAGNOSIS — M545 Low back pain, unspecified: Secondary | ICD-10-CM | POA: Insufficient documentation

## 2012-12-24 DIAGNOSIS — M4120 Other idiopathic scoliosis, site unspecified: Secondary | ICD-10-CM | POA: Insufficient documentation

## 2012-12-24 DIAGNOSIS — IMO0001 Reserved for inherently not codable concepts without codable children: Secondary | ICD-10-CM | POA: Insufficient documentation

## 2012-12-24 DIAGNOSIS — R209 Unspecified disturbances of skin sensation: Secondary | ICD-10-CM | POA: Insufficient documentation

## 2012-12-27 ENCOUNTER — Ambulatory Visit: Payer: BC Managed Care – PPO | Admitting: Rehabilitation

## 2012-12-31 ENCOUNTER — Ambulatory Visit: Payer: BC Managed Care – PPO | Admitting: Rehabilitation

## 2013-01-01 ENCOUNTER — Encounter: Payer: BC Managed Care – PPO | Admitting: Rehabilitation

## 2013-01-02 ENCOUNTER — Ambulatory Visit: Payer: BC Managed Care – PPO | Admitting: Rehabilitation

## 2013-01-07 ENCOUNTER — Ambulatory Visit: Payer: BC Managed Care – PPO | Attending: Family | Admitting: Rehabilitation

## 2013-01-07 DIAGNOSIS — M4120 Other idiopathic scoliosis, site unspecified: Secondary | ICD-10-CM | POA: Insufficient documentation

## 2013-01-07 DIAGNOSIS — M545 Low back pain, unspecified: Secondary | ICD-10-CM | POA: Insufficient documentation

## 2013-01-07 DIAGNOSIS — R209 Unspecified disturbances of skin sensation: Secondary | ICD-10-CM | POA: Insufficient documentation

## 2013-01-07 DIAGNOSIS — IMO0001 Reserved for inherently not codable concepts without codable children: Secondary | ICD-10-CM | POA: Insufficient documentation

## 2013-01-08 ENCOUNTER — Ambulatory Visit: Payer: BC Managed Care – PPO | Admitting: Family

## 2013-01-09 ENCOUNTER — Ambulatory Visit: Payer: BC Managed Care – PPO | Admitting: Rehabilitation

## 2013-01-09 ENCOUNTER — Ambulatory Visit: Payer: BC Managed Care – PPO | Admitting: Family

## 2013-01-15 ENCOUNTER — Ambulatory Visit: Payer: BC Managed Care – PPO | Admitting: Rehabilitation

## 2013-01-17 ENCOUNTER — Ambulatory Visit: Payer: BC Managed Care – PPO | Admitting: Rehabilitation

## 2013-01-22 ENCOUNTER — Ambulatory Visit: Payer: BC Managed Care – PPO | Admitting: Rehabilitation

## 2013-01-24 ENCOUNTER — Ambulatory Visit: Payer: BC Managed Care – PPO | Admitting: Rehabilitation

## 2013-01-30 ENCOUNTER — Ambulatory Visit: Payer: BC Managed Care – PPO | Admitting: Rehabilitation

## 2013-01-31 ENCOUNTER — Ambulatory Visit: Payer: BC Managed Care – PPO | Admitting: Rehabilitation

## 2013-02-06 ENCOUNTER — Ambulatory Visit: Payer: BC Managed Care – PPO | Attending: Family | Admitting: Rehabilitation

## 2013-02-06 DIAGNOSIS — M545 Low back pain, unspecified: Secondary | ICD-10-CM | POA: Insufficient documentation

## 2013-02-06 DIAGNOSIS — M4120 Other idiopathic scoliosis, site unspecified: Secondary | ICD-10-CM | POA: Insufficient documentation

## 2013-02-06 DIAGNOSIS — R209 Unspecified disturbances of skin sensation: Secondary | ICD-10-CM | POA: Insufficient documentation

## 2013-02-06 DIAGNOSIS — IMO0001 Reserved for inherently not codable concepts without codable children: Secondary | ICD-10-CM | POA: Insufficient documentation

## 2013-02-07 ENCOUNTER — Ambulatory Visit: Payer: BC Managed Care – PPO | Admitting: Rehabilitation

## 2013-02-12 ENCOUNTER — Ambulatory Visit: Payer: BC Managed Care – PPO | Admitting: Rehabilitation

## 2013-02-13 ENCOUNTER — Ambulatory Visit: Payer: BC Managed Care – PPO | Admitting: Rehabilitation

## 2013-02-14 ENCOUNTER — Ambulatory Visit: Payer: BC Managed Care – PPO | Admitting: Rehabilitation

## 2013-02-25 ENCOUNTER — Encounter: Payer: Self-pay | Admitting: Family

## 2013-02-25 ENCOUNTER — Telehealth: Payer: Self-pay | Admitting: *Deleted

## 2013-02-25 ENCOUNTER — Ambulatory Visit (INDEPENDENT_AMBULATORY_CARE_PROVIDER_SITE_OTHER): Payer: BC Managed Care – PPO | Admitting: Family

## 2013-02-25 VITALS — BP 138/86 | HR 87 | Temp 98.7°F | Resp 16 | Wt 298.1 lb

## 2013-02-25 DIAGNOSIS — H6692 Otitis media, unspecified, left ear: Secondary | ICD-10-CM

## 2013-02-25 DIAGNOSIS — H6120 Impacted cerumen, unspecified ear: Secondary | ICD-10-CM

## 2013-02-25 DIAGNOSIS — H6690 Otitis media, unspecified, unspecified ear: Secondary | ICD-10-CM

## 2013-02-25 DIAGNOSIS — H6123 Impacted cerumen, bilateral: Secondary | ICD-10-CM

## 2013-02-25 DIAGNOSIS — I10 Essential (primary) hypertension: Secondary | ICD-10-CM

## 2013-02-25 MED ORDER — AMOXICILLIN-POT CLAVULANATE 875-125 MG PO TABS: 1.0000 | ORAL_TABLET | Freq: Two times a day (BID) | ORAL | Status: DC

## 2013-02-25 MED ORDER — HYDROCHLOROTHIAZIDE 25 MG PO TABS: 25.0000 mg | ORAL_TABLET | Freq: Every day | ORAL | Status: DC

## 2013-02-25 NOTE — Assessment & Plan Note (Addendum)
Despite flushing and removal by curette, I was still unable to visualize TM's.  Will plan empiric rx with Augmentin for probable L OM.

## 2013-02-25 NOTE — Patient Instructions (Addendum)
Please complete lab work in 1 week.  Call if ear pain worsens, or if not resolved in 1 week.

## 2013-02-25 NOTE — Assessment & Plan Note (Addendum)
BP is fair.  Will continue amlodipine 5mg  and increase hctz to 25mg  once daily.  I have asked pt to return in 1 week for bmet.

## 2013-02-25 NOTE — Progress Notes (Signed)
  Subjective:    Patient ID: Amy Sutton, female    DOB: March 30, 1981, 32 y.o.   MRN: [id number]  HPI  Amy Sutton is a 32 yr old female who presents today for follow up.  HTN- last visit ACE was discontinued due to cough. She was instead started on amlodipine.  Reports feeling well on this dose, but reports some LE edema with sitting since starting amlodipine.    Otalgia-Pt reports that her left ear has been hurting her for 6 days. Reports that her pain is 6-7/10 currently.    Review of Systems Past Medical History  Diagnosis Date  . Hypertension     History   Social History  . Marital Status: Divorced    Spouse Name: N/A    Number of Children: 1  . Years of Education: N/A   Occupational History  .     Social History Main Topics  . Smoking status: Never Smoker   . Smokeless tobacco: Never Used  . Alcohol Use: 1.5 oz/week    3 drink(s) per week  . Drug Use: Not on file  . Sexually Active: Not on file   Other Topics Concern  . Not on file   Social History Narrative   Regular exercise:  Not currently   Caffeine Use:  Sometimes   25 yr old son   Consulting civil engineer at [employer].   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  Family History  Problem Relation Age of Onset  . Arthritis Mother   . Hypertension Mother   . Diabetes Mother   . Hypertension Father   . Diabetes Father   . Cancer Maternal Uncle     prostate  . Cancer Paternal Aunt     No Known Allergies  Current Outpatient Prescriptions on File Prior to Visit  Medication Sig Dispense Refill  . amLODipine (NORVASC) 5 MG tablet Take 1 tablet (5 mg total) by mouth daily.  30 tablet  2  . etonogestrel (IMPLANON) 68 MG IMPL implant Inject 1 each into the skin once. Per GYN       No current facility-administered medications on file prior to visit.    BP 138/86  Pulse 87  Temp(Src) 98.7 F (37.1 C) (Oral)  Resp 16  Wt 298 lb 1.3 oz (135.208 kg)  BMI 44 kg/m2   SpO2 99%       Objective:   Physical Exam  Constitutional: She is oriented to person, place, and time. She appears well-developed and well-nourished. No distress.  HENT:  Head: Normocephalic and atraumatic.  Bilateral TM's are occluded by cerumen  Cardiovascular: Normal rate and regular rhythm.   Pulmonary/Chest: Effort normal and breath sounds normal. No respiratory distress. She has no wheezes. She has no rales. She exhibits no tenderness.  Musculoskeletal:  1+ bilateral LE edema  Neurological: She is alert and oriented to person, place, and time.  Skin: Skin is warm and dry.  Psychiatric: She has a normal mood and affect. Her behavior is normal. Judgment and thought content normal.          Assessment & Plan:

## 2013-02-25 NOTE — Assessment & Plan Note (Signed)
Ceruminosis is noted.  Wax is removed by syringing and manual debridement. Instructions for home care to prevent wax buildup are given.  

## 2013-02-25 NOTE — Telephone Encounter (Signed)
Received call from pt that [facility] did not receive antibiotic rx. Rx called to pharmacist and pt has been notified.

## 2013-02-26 ENCOUNTER — Telehealth: Payer: Self-pay | Admitting: *Deleted

## 2013-02-26 MED ORDER — HYDROCHLOROTHIAZIDE 25 MG PO TABS: 25.0000 mg | ORAL_TABLET | Freq: Every day | ORAL | Status: DC

## 2013-02-26 MED ORDER — AMLODIPINE BESYLATE 5 MG PO TABS: 5.0000 mg | ORAL_TABLET | Freq: Every day | ORAL | Status: DC

## 2013-02-26 NOTE — Telephone Encounter (Signed)
Notified pt and she voices understanding. Pt also requests refill on HCTZ and amlodipine. Rxs left on pharmacy voicemail.

## 2013-02-26 NOTE — Telephone Encounter (Signed)
Pt started penicillin around 1am today for possible ear infection. Took another dose around 10:45am. Reports that pt is in a lot of pain and jaw appears swollen. Reports that pain feels like it is in the back of her mouth and hurts her to eat. States they contacted her dentist and he recommended that pt complete antibiotic first before they assess pt. Pt's mom states that pt has taken tylenol for the pain today as ibuprofen did not seem to be helping. Pt is resting now and mother states she will call us back if pain continues or is not helped by tylenol.  Please advise.

## 2013-02-26 NOTE — Telephone Encounter (Signed)
Sounds like abscessed tooth.  I agree that she should continue augmentin.  I would like her to call us if increased pain/swelling or if fever over 101.  Otherwise, I would I think she needs to be seen by her dentist as soon as possible.  I would recommend that they call dentist back and let him know that PCP would like pt evaluated prior to completion or abx.  Let me know if they have trouble getting in.

## 2013-03-05 ENCOUNTER — Encounter: Payer: Self-pay | Admitting: Family

## 2013-03-05 LAB — BASIC METABOLIC PANEL
CO2: 25 mEq/L (ref 19–32)
Calcium: 9.2 mg/dL (ref 8.4–10.5)
Creat: 0.69 mg/dL (ref 0.50–1.10)
Sodium: 143 mEq/L (ref 135–145)

## 2013-06-24 ENCOUNTER — Encounter: Payer: Self-pay | Admitting: Family

## 2013-06-24 ENCOUNTER — Ambulatory Visit (INDEPENDENT_AMBULATORY_CARE_PROVIDER_SITE_OTHER): Payer: BC Managed Care – PPO | Admitting: Family

## 2013-06-24 VITALS — BP 150/78 | HR 104 | Temp 98.2°F | Resp 16 | Ht 69.0 in | Wt 297.0 lb

## 2013-06-24 DIAGNOSIS — I10 Essential (primary) hypertension: Secondary | ICD-10-CM

## 2013-06-24 DIAGNOSIS — M545 Low back pain, unspecified: Secondary | ICD-10-CM

## 2013-06-24 DIAGNOSIS — Z23 Encounter for immunization: Secondary | ICD-10-CM

## 2013-06-24 MED ORDER — AMLODIPINE BESYLATE 10 MG PO TABS: 10.0000 mg | ORAL_TABLET | Freq: Every day | ORAL | Status: DC

## 2013-06-24 NOTE — Assessment & Plan Note (Signed)
Patient's BMI has increased steadily.  Discussed weight management by counting calories to reduce total daily calorie consumption.  Recommended My Fitness Pal through her smart phone.  Encouraged exercise to 5 days weekly for 30 min per day.  Patient to follow up in one month and has a goal to lose 4 pounds by the next appointment.

## 2013-06-24 NOTE — Progress Notes (Signed)
  Subjective:    Patient ID: Amy Sutton, female    DOB: [birth year], 32 y.o.   MRN: [id number]  HPI    Review of Systems     Objective:   Physical Exam        Assessment & Plan:

## 2013-06-24 NOTE — Progress Notes (Signed)
Subjective:    Patient ID: Amy Sutton, female    DOB: May 13, 1981, 32 y.o.   MRN: [id number]  HPI Amy Sutton is a 32 year old female who presents today for follow up.  HTN- reports compliance to medications, denies chest pain and shortness of breath. Hypertensive in office today at 150/78.  Back pain- patient reports lower left back pain and has now completed physical therapy. Patient reports physical therapy helped, however, continues to experience pain after walking for a prolonged amount of time.    Weight gain- Patient reports high calorie meals three times daily. Breakfast (sausage and cheese bagle) lunch (hot dog) dinner (home cooked meals). Patient reports she exercises 4 days a week for at least 30 min per day.   Review of Systems  Constitutional: Negative for activity change and appetite change.  HENT: Negative for congestion and sore throat.   Respiratory: Negative for cough and shortness of breath.   Cardiovascular: Negative for chest pain and leg swelling.  Genitourinary: Negative for dysuria and urgency.  Musculoskeletal: Positive for back pain.       Was treated by physical therapy and reports pain decreased at the time, now has returned.   Neurological: Negative for dizziness and numbness.   Past Medical History  Diagnosis Date  . Hypertension     History   Social History  . Marital Status: Divorced    Spouse Name: N/A    Number of Children: 1  . Years of Education: N/A   Occupational History  .     Social History Main Topics  . Smoking status: Never Smoker   . Smokeless tobacco: Never Used  . Alcohol Use: 1.5 oz/week    3 drink(s) per week  . Drug Use: Not on file  . Sexual Activity: Not on file   Other Topics Concern  . 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  Family History  Problem Relation Age of Onset  . Arthritis Mother   . Hypertension Mother   . Diabetes Mother   . Hypertension Father   . Diabetes Father   . Cancer Maternal Uncle     prostate  . Cancer Paternal Aunt     No Known Allergies  Current Outpatient Prescriptions on File Prior to Visit  Medication Sig Dispense Refill  . etonogestrel (IMPLANON) 68 MG IMPL implant Inject 1 each into the skin once. Per GYN      . hydrochlorothiazide (HYDRODIURIL) 25 MG tablet Take 1 tablet (25 mg total) by mouth daily.  30 tablet  3   No current facility-administered medications on file prior to visit.    BP 150/78  Pulse 104  Temp(Src) 98.2 F (36.8 C) (Oral)  Resp 16  Ht 5\' 9"  (1.753 m)  Wt 297 lb (134.718 kg)  BMI 43.84 kg/m2  SpO2 99%       Objective:   Physical Exam  Constitutional: She is oriented to person, place, and time. She appears well-nourished.  HENT:  Head: Normocephalic.  Cardiovascular: Normal rate and regular rhythm.   No murmur heard. Pulmonary/Chest: Effort normal and breath sounds normal. She has no wheezes.  Musculoskeletal: Normal range of motion.  No  pain upon active ROM to lumbar  Neurological: She is alert and oriented to person, place, and time.  Skin: Skin is warm and dry.          Assessment & Plan:

## 2013-06-24 NOTE — Assessment & Plan Note (Signed)
Discussed continuation of PT exercises, importance of weight loss in improving her back pain, and role of scoliosis in her back pain issues.

## 2013-06-24 NOTE — Assessment & Plan Note (Signed)
Hypertensive in office today.  Increase Norvasc to 10mg  daily, continue HCTZ as prescribed.  Follow up in one month for recheck.

## 2013-06-24 NOTE — Patient Instructions (Signed)
Increase amlodipine to 10mg  once daily. Try to exercise 30 minutes 5 days a week. Count calories and log exercise using myfitnesspal app on your phone. Try to limit calories to 1500 or less a day. Follow up in 1 month.

## 2013-06-27 ENCOUNTER — Other Ambulatory Visit: Payer: Self-pay | Admitting: *Deleted

## 2013-06-27 MED ORDER — HYDROCHLOROTHIAZIDE 25 MG PO TABS: 25.0000 mg | ORAL_TABLET | Freq: Every day | ORAL | Status: DC

## 2013-06-27 NOTE — Telephone Encounter (Signed)
Rx request to pharmacy/SLS  

## 2013-07-23 ENCOUNTER — Ambulatory Visit (INDEPENDENT_AMBULATORY_CARE_PROVIDER_SITE_OTHER): Payer: BC Managed Care – PPO | Admitting: Family

## 2013-07-23 ENCOUNTER — Encounter: Payer: Self-pay | Admitting: Family

## 2013-07-23 VITALS — BP 136/88 | HR 98 | Temp 98.0°F | Resp 16 | Ht 69.0 in | Wt 304.0 lb

## 2013-07-23 DIAGNOSIS — M545 Low back pain, unspecified: Secondary | ICD-10-CM

## 2013-07-23 DIAGNOSIS — I10 Essential (primary) hypertension: Secondary | ICD-10-CM

## 2013-07-23 MED ORDER — MELOXICAM 7.5 MG PO TABS: 7.5000 mg | ORAL_TABLET | Freq: Every day | ORAL | Status: DC

## 2013-07-23 MED ORDER — AMLODIPINE BESYLATE 5 MG PO TABS: 10.0000 mg | ORAL_TABLET | Freq: Every day | ORAL | Status: DC

## 2013-07-23 MED ORDER — HYDROCHLOROTHIAZIDE 25 MG PO TABS: 25.0000 mg | ORAL_TABLET | Freq: Every day | ORAL | Status: DC

## 2013-07-23 NOTE — Patient Instructions (Signed)
Work hard on [employer], exercise, weight loss. Please schedule a follow up appointment in 3 months.

## 2013-07-23 NOTE — Progress Notes (Signed)
Pre visit review using our clinic review tool, if applicable. No additional management support is needed unless otherwise documented below in the visit note/SLS  

## 2013-07-23 NOTE — Assessment & Plan Note (Signed)
Continues to have low back pain. Encouraged her to continue the PT exercises, add short course of meloxicam, encouraged weight loss.

## 2013-07-23 NOTE — Progress Notes (Signed)
  Subjective:    Patient ID: Amy Sutton, female    DOB: [birth year], 32 y.o.   MRN: [id number]  HPI  Amy Sutton is a 32 yr old female who presents today for follow up.  Last month her blood pressure was noted to be elevated.  Her amlodipine was increased from 5mg  to 10mg .  She states that she ran out of HCTZ 7 days ago and the pharmacy told her that they did not have a refill. Reports that she has trouble swallowing the 10mg  tabs. Would prefer two 5 mg tabs as they are easier to swallow.  Denies CP or SOB.  Reports mild LE swelling which she attributes to being out of her HCTZ.   Low back pain- persistent low back pain.  X ray lumbar spine noted mild degenerative disc disease.  Pain is non-radiating.  Review of Systems    see HPI  Past Medical History  Diagnosis Date  . Hypertension     History   Social History  . Marital Status: Divorced    Spouse Name: N/A    Number of Children: 1  . Years of Education: N/A   Occupational History  .     Social History Main Topics  . Smoking status: Never Smoker   . Smokeless tobacco: Never Used  . Alcohol Use: 1.5 oz/week    3 drink(s) per week  . Drug Use: Not on file  . Sexual Activity: Not on file   Other Topics Concern  . 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  Family History  Problem Relation Age of Onset  . Arthritis Mother   . Hypertension Mother   . Diabetes Mother   . Hypertension Father   . Diabetes Father   . Cancer Maternal Uncle     prostate  . Cancer Paternal Aunt     No Known Allergies  Current Outpatient Prescriptions on File Prior to Visit  Medication Sig Dispense Refill  . amLODipine (NORVASC) 10 MG tablet Take 1 tablet (10 mg total) by mouth daily.  30 tablet  0  . etonogestrel (IMPLANON) 68 MG IMPL  implant Inject 1 each into the skin once. Per GYN       No current facility-administered medications on file prior to visit.    BP 136/88  Pulse 98  Temp(Src) 98 F (36.7 C) (Oral)  Resp 16  Ht 5\' 9"  (1.753 m)  Wt 304 lb (137.893 kg)  BMI 44.87 kg/m2  SpO2 98%    Objective:   Physical Exam  Constitutional: She is oriented to person, place, and time. She appears well-developed and well-nourished. No distress.  Cardiovascular: Normal rate and regular rhythm.   No murmur heard. Pulmonary/Chest: Effort normal and breath sounds normal. No respiratory distress. She has no wheezes. She has no rales. She exhibits no tenderness.  Musculoskeletal:  Trace bilateral LE edema.   Neurological: She is alert and oriented to person, place, and time.          Assessment & Plan:

## 2013-07-23 NOTE — Assessment & Plan Note (Signed)
BP Readings from Last 3 Encounters:  07/23/13 136/88  06/24/13 150/78  02/25/13 138/86   Improved, continue amlodipine, resume hctz.

## 2013-09-30 ENCOUNTER — Ambulatory Visit (INDEPENDENT_AMBULATORY_CARE_PROVIDER_SITE_OTHER): Payer: BC Managed Care – PPO | Admitting: Family

## 2013-09-30 ENCOUNTER — Encounter: Payer: Self-pay | Admitting: Family

## 2013-09-30 VITALS — BP 146/96 | HR 90 | Temp 97.9°F | Resp 16 | Ht 69.0 in | Wt 300.1 lb

## 2013-09-30 DIAGNOSIS — Z111 Encounter for screening for respiratory tuberculosis: Secondary | ICD-10-CM

## 2013-09-30 DIAGNOSIS — H6120 Impacted cerumen, unspecified ear: Secondary | ICD-10-CM

## 2013-09-30 NOTE — Progress Notes (Signed)
Pre visit review using our clinic review tool, if applicable. No additional management support is needed unless otherwise documented below in the visit note. 

## 2013-09-30 NOTE — Assessment & Plan Note (Signed)
Removed large amount of firm cerumen. Educated on ear wax removal kits.

## 2013-09-30 NOTE — Patient Instructions (Addendum)
Follow up on Wednesday for the results of your TB test. You may purchase over the counter ear wax softening/removal kits for the removal of ear wax.

## 2013-09-30 NOTE — Progress Notes (Signed)
Subjective:    Patient ID: Amy Sutton, female    DOB: 05-Mar-1981, 33 y.o.   MRN: [id number]  Otalgia  Associated symptoms include headaches and a sore throat. Pertinent negatives include no coughing or rhinorrhea.   [name] is a 33 year old female who presents today with a chief complaint of pain to left ear with pain radiating down left jaw since last night. Patient reports taking ibuprofen with minimal relief. Patient reports cool air makes her pain worse. Denies fevers, chills.  Patient reports unilateral left sided headache.   Review of Systems  Constitutional: Negative for fever and chills.  HENT: Positive for ear pain and sore throat. Negative for rhinorrhea and sinus pressure.   Respiratory: Negative for cough and shortness of breath.   Neurological: Positive for headaches.   Past Medical History  Diagnosis Date  . Hypertension     History   Social History  . Marital Status: Divorced    Spouse Name: N/A    Number of Children: 1  . Years of Education: N/A   Occupational History  .     Social History Main Topics  . Smoking status: Never Smoker   . Smokeless tobacco: Never Used  . Alcohol Use: 1.5 oz/week    3 drink(s) per week  . Drug Use: Not on file  . Sexual Activity: Not on file   Other Topics Concern  . 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  Family History  Problem Relation Age of Onset  . Arthritis Mother   . Hypertension Mother   . Diabetes Mother   . Hypertension Father   . Diabetes Father   . Cancer Maternal Uncle     prostate  . Cancer Paternal Aunt     No Known Allergies  Current Outpatient Prescriptions on File Prior to Visit  Medication Sig Dispense Refill  . amLODipine (NORVASC) 5 MG tablet Take 2 tablets (10 mg total) by mouth  daily.  60 tablet  3  . etonogestrel (IMPLANON) 68 MG IMPL implant Inject 1 each into the skin once. Per GYN      . hydrochlorothiazide (HYDRODIURIL) 25 MG tablet Take 1 tablet (25 mg total) by mouth daily.  30 tablet  3  . meloxicam (MOBIC) 7.5 MG tablet Take 1 tablet (7.5 mg total) by mouth daily.  14 tablet  0   No current facility-administered medications on file prior to visit.    BP 146/96  Pulse 90  Temp(Src) 97.9 F (36.6 C) (Oral)  Resp 16  Ht 5\' 9"  (1.753 m)  Wt 300 lb 1.9 oz (136.134 kg)  BMI 44.30 kg/m2  SpO2 97%        Objective:   Physical Exam  Constitutional: She appears well-nourished.  HENT:  Head: Normocephalic.  Right Ear: Tympanic membrane and external ear normal. No tenderness.  Left Ear: There is tenderness. No decreased hearing is noted.  Large amount of firm cerumen removed from left ear using curette and irrigation. TM translucent without erythemia post removal.  Eyes: Pupils are equal, round, and reactive to light.  Neck: Neck supple.  Cardiovascular: Normal rate and regular rhythm.   Pulmonary/Chest: Breath sounds  normal. No respiratory distress. She has no wheezes.  Lymphadenopathy:    She has no cervical adenopathy.          Assessment & Plan:  I have personally seen and examined patient and agree with Vernona RiegerKatherine Babs Dabbs NP student's assessment and plan.

## 2013-10-02 ENCOUNTER — Ambulatory Visit (HOSPITAL_BASED_OUTPATIENT_CLINIC_OR_DEPARTMENT_OTHER)
Admission: RE | Admit: 2013-10-02 | Discharge: 2013-10-02 | Disposition: A | Discharge status: Home / Self Care | Payer: BC Managed Care – PPO | Source: Ambulatory Visit | Attending: Physician Assistant | Admitting: Physician Assistant

## 2013-10-02 ENCOUNTER — Ambulatory Visit (INDEPENDENT_AMBULATORY_CARE_PROVIDER_SITE_OTHER): Payer: BC Managed Care – PPO | Admitting: Physician Assistant

## 2013-10-02 ENCOUNTER — Encounter: Payer: Self-pay | Admitting: Physician Assistant

## 2013-10-02 ENCOUNTER — Telehealth: Payer: Self-pay | Admitting: *Deleted

## 2013-10-02 ENCOUNTER — Other Ambulatory Visit: Payer: Self-pay | Admitting: Physician Assistant

## 2013-10-02 VITALS — BP 124/82 | HR 86 | Temp 98.7°F | Resp 16

## 2013-10-02 DIAGNOSIS — R7611 Nonspecific reaction to tuberculin skin test without active tuberculosis: Secondary | ICD-10-CM

## 2013-10-02 LAB — TB SKIN TEST
INDURATION: 20 mm
TB SKIN TEST: POSITIVE

## 2013-10-02 NOTE — Telephone Encounter (Signed)
Pt states she continues to have ear pain and was told to call us if symptoms persisted and we would send antibiotic.  Please advise.

## 2013-10-02 NOTE — Progress Notes (Signed)
Patient presented for PPD skin test reading.  Nurse noted induration of 20 mm on testing.  Patient denies recent travel, sick contact, cough, hemoptysis, fever, chills, malaise or fatigue.  Patient states she always reacts this way to her TB testing.  She did not make the nurse aware of this when she had her PPD placement.  Past Medical History  Diagnosis Date  . Hypertension     Current Outpatient Prescriptions on File Prior to Visit  Medication Sig Dispense Refill  . amLODipine (NORVASC) 5 MG tablet Take 2 tablets (10 mg total) by mouth daily.  60 tablet  3  . etonogestrel (IMPLANON) 68 MG IMPL implant Inject 1 each into the skin once. Per GYN      . hydrochlorothiazide (HYDRODIURIL) 25 MG tablet Take 1 tablet (25 mg total) by mouth daily.  30 tablet  3   No current facility-administered medications on file prior to visit.    No Known Allergies  Family History  Problem Relation Age of Onset  . Arthritis Mother   . Hypertension Mother   . Diabetes Mother   . Hypertension Father   . Diabetes Father   . Cancer Maternal Uncle     prostate  . Cancer Paternal Aunt     History   Social History  . Marital Status: Divorced    Spouse Name: N/A    Number of Children: 1  . Years of Education: N/A   Occupational History  .     Social History Main Topics  . Smoking status: Never Smoker   . Smokeless tobacco: Never Used  . Alcohol Use: 1.5 oz/week    3 drink(s) per week  . Drug Use: None  . Sexual Activity: None   Other Topics Concern  . 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  All other ROS are negative.  Filed Vitals:   10/02/13 1556  BP: 124/82  Pulse: 86  Temp: 98.7 F (37.1 C)  Resp: 16    Physical Exam  Vitals reviewed. Constitutional: She is oriented to person, place, and time  and well-developed, well-nourished, and in no distress.  HENT:  Head: Normocephalic and atraumatic.  Eyes: Conjunctivae are normal.  Neck: Neck supple.  Cardiovascular: Normal rate, regular rhythm and normal heart sounds.   Pulmonary/Chest: Effort normal and breath sounds normal. No respiratory distress. She has no wheezes. She has no rales. She exhibits no tenderness.  Neurological: She is alert and oriented to person, place, and time.  Skin: Skin is warm and dry. No rash noted.  Psychiatric: Affect normal.    Recent Results (from the past 2160 hour(s))  TB SKIN TEST     Status: None   Collection Time    10/02/13  3:47 PM      Result Value Range   TB Skin Test Positive     Induration 20      Assessment/Plan: No problem-specific assessment & plan notes found for this encounter.

## 2013-10-02 NOTE — Patient Instructions (Signed)
Workup was negative.  You are negative for TB.  You should avoid the skin testing in the future -- you will need different testing for your employers.

## 2013-10-02 NOTE — Assessment & Plan Note (Addendum)
Patient reports history of positive PPD test, unknown to us.  History and PE unremarkable.  Will send for STAT CXR. Will obtain Gold's Assay.

## 2013-10-02 NOTE — Progress Notes (Signed)
Pre visit review using our clinic review tool, if applicable. No additional management support is needed unless otherwise documented below in the visit note. 

## 2013-10-03 MED ORDER — AMOXICILLIN 500 MG PO CAPS: 500.0000 mg | ORAL_CAPSULE | Freq: Three times a day (TID) | ORAL | Status: DC

## 2013-10-03 NOTE — Telephone Encounter (Signed)
Rx has been sent to [facility] for amoxicillin.

## 2013-10-03 NOTE — Telephone Encounter (Signed)
FYI: Spoke with Amy Sutton, TB specialist [phone] with [facility] and gave all information RE: pt's positive PPD & chest Xray: pt presented to office with positive PPD 20 mm read: asymptomatic & w/o Hx of TB; chest Xray resulted w/o evidence of active TB but questionable for previous disease. Lab for Quantiferon tb gold assay pending. Per Ms. Sutton request, all notes & results from encounter with pt demographics faxed to her office at [phone]. They will contact patient to come in to Health Department for any further necessary Tx; we will fax over lab result on gold assay when received/SLS

## 2013-10-03 NOTE — Telephone Encounter (Signed)
Patient is returning your call  She will be free till 3:40 today

## 2013-10-03 NOTE — Telephone Encounter (Signed)
Also, I see that her PPD was +.  Has she ever completed antibiotic treatment through the health department?  If not I will make referral.  This will prevent her from potentially developing complications from TB in the future.

## 2013-10-03 NOTE — Addendum Note (Signed)
Addended by: Marcelline MatesMARTIN, Jarad Barth on: 10/03/2013 10:23 AM   Modules accepted: Orders

## 2013-10-04 ENCOUNTER — Encounter: Payer: Self-pay | Admitting: *Deleted

## 2013-10-04 NOTE — Telephone Encounter (Signed)
Pt notified, has never had TB treatment before. Copies of ppd result and cxr given to pt.

## 2013-10-07 LAB — QUANTIFERON TB GOLD ASSAY (BLOOD)
INTERFERON GAMMA RELEASE ASSAY: NEGATIVE
Quantiferon Nil Value: 0.05 IU/mL
Quantiferon Tb Ag Minus Nil Value: 0 IU/mL
TB Ag value: 0.04 IU/mL

## 2013-10-09 ENCOUNTER — Ambulatory Visit (INDEPENDENT_AMBULATORY_CARE_PROVIDER_SITE_OTHER): Payer: BC Managed Care – PPO | Admitting: Family Medicine

## 2013-10-09 ENCOUNTER — Encounter: Payer: Self-pay | Admitting: Family Medicine

## 2013-10-09 ENCOUNTER — Ambulatory Visit (HOSPITAL_BASED_OUTPATIENT_CLINIC_OR_DEPARTMENT_OTHER)
Admission: RE | Admit: 2013-10-09 | Discharge: 2013-10-09 | Disposition: A | Discharge status: Home / Self Care | Payer: BC Managed Care – PPO | Source: Ambulatory Visit | Attending: Family Medicine | Admitting: Family Medicine

## 2013-10-09 VITALS — BP 150/89 | HR 77 | Ht 71.0 in | Wt 300.0 lb

## 2013-10-09 DIAGNOSIS — M259 Joint disorder, unspecified: Secondary | ICD-10-CM | POA: Insufficient documentation

## 2013-10-09 DIAGNOSIS — M25561 Pain in right knee: Secondary | ICD-10-CM

## 2013-10-09 DIAGNOSIS — M25569 Pain in unspecified knee: Secondary | ICD-10-CM

## 2013-10-09 NOTE — Progress Notes (Signed)
Patient ID: Amy Sutton, female   DOB: [birth year], 33 y.o.   MRN: [id number]  PCP: Lemont Fillers'SULLIVAN,[name] S., NP  Subjective:   HPI: Patient is a 33 y.o. female here for right knee pain.  Patient reports she has had off and on right knee pain for over 1 year. Worse with running and stairs. No injury or trauma. Pain primarily lateral knee. Time of day doesn't matter. Taking tylenol, ibuprofen, using heat and ice. No catching, locking, giving out. Not had x-rays or done PT.  Past Medical History  Diagnosis Date  . Hypertension     Current Outpatient Prescriptions on File Prior to Visit  Medication Sig Dispense Refill  . amLODipine (NORVASC) 5 MG tablet Take 2 tablets (10 mg total) by mouth daily.  60 tablet  3  . etonogestrel (IMPLANON) 68 MG IMPL implant Inject 1 each into the skin once. Per GYN      . hydrochlorothiazide (HYDRODIURIL) 25 MG tablet Take 1 tablet (25 mg total) by mouth daily.  30 tablet  3  . amoxicillin (AMOXIL) 500 MG capsule Take 1 capsule (500 mg total) by mouth 3 (three) times daily.  30 capsule  0   No current facility-administered medications on file prior to visit.    History reviewed. No pertinent past surgical history.  No Known Allergies  History   Social History  . Marital Status: Divorced    Spouse Name: N/A    Number of Children: 1  . Years of Education: N/A   Occupational History  .     Social History Main Topics  . Smoking status: Never Smoker   . Smokeless tobacco: Never Used  . Alcohol Use: 1.5 oz/week    3 drink(s) per week  . Drug Use: Not on file  . Sexual Activity: Not on file   Other Topics Concern  . 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 Arthritis Mother   . Hypertension Mother   . Diabetes Mother   .  Hyperlipidemia Mother   . Hypertension Father   . Diabetes Father   . Hyperlipidemia Father   . Cancer Maternal Uncle     prostate  . Cancer Paternal Aunt     BP 150/89  Pulse 77  Ht 5\' 11"  (1.803 m)  Wt 300 lb (136.079 kg)  BMI 41.86 kg/m2  LMP 09/17/2013  Review of Systems: See HPI above.    Objective:  Physical Exam:  Gen: NAD  Right knee: No gross deformity, ecchymoses, swelling. VMO atrophy. Mild TTP lateral joint line and lateral post patellar facet. FROM. Negative ant/post drawers. Negative valgus/varus testing. Negative lachmanns. Negative mcmurrays, apleys, patellar apprehension. NV intact distally. Hip abduction 5/5. Pes planus.    Assessment & Plan:  1. Right knee pain - Consistent with patellofemoral syndrome.  Radiographs do show some arthritis lateral compartment - has pain here as well.  Start with home exercise program, arch supports.  Consider formal PT.  Icing, tylenol, nsaids, glucosamine, capsaicin discussed.  Consider trial of intraarticular injection.  F/u in 5-6 weeks.

## 2013-10-09 NOTE — Patient Instructions (Signed)
Get x-rays before you leave today. You have patellofemoral syndrome Avoid painful activities (especially squats and lunges, plyometrics, increasing running mileage) as much as possible. Cross train with swimming, cycling with low resistance, elliptical if necessary. Straight leg raise, straight leg raise with foot turned outward, hip side raises at least once daily - 3 sets of 10. Add ankle weight if these become too easy. Consider formal physical therapy Try Dr. Jari SportsmanScholls active series insoles or something with better arch support. Avoid barefoot walking as much as possible. Icing 15 minutes at a time 3-4 times a day as needed. Tylenol or aleve as needed for pain Follow up with me in 5-6 weeks for reevaluation. Other medicines you can consider IF you have arthritis: Glucosamine sulfate 750mg  twice a day is a supplement that may help. Capsaicin topically up to four times a day may also help with pain.

## 2013-10-11 NOTE — Telephone Encounter (Signed)
TB Quant Gold is negative.  Could you please notify pt and also forward result to Health department?

## 2013-10-11 NOTE — Telephone Encounter (Signed)
Result faxed to # below and pt notified.

## 2013-10-14 ENCOUNTER — Encounter: Payer: Self-pay | Admitting: Family Medicine

## 2013-10-14 DIAGNOSIS — M25561 Pain in right knee: Secondary | ICD-10-CM | POA: Insufficient documentation

## 2013-10-14 NOTE — Assessment & Plan Note (Signed)
Consistent with patellofemoral syndrome.  Radiographs do show some arthritis lateral compartment - has pain here as well.  Start with home exercise program, arch supports.  Consider formal PT.  Icing, tylenol, nsaids, glucosamine, capsaicin discussed.  Consider trial of intraarticular injection.  F/u in 5-6 weeks.

## 2013-10-30 ENCOUNTER — Ambulatory Visit (INDEPENDENT_AMBULATORY_CARE_PROVIDER_SITE_OTHER): Payer: BC Managed Care – PPO | Admitting: Family

## 2013-10-30 ENCOUNTER — Other Ambulatory Visit: Payer: Self-pay | Admitting: Family

## 2013-10-30 ENCOUNTER — Encounter (INDEPENDENT_AMBULATORY_CARE_PROVIDER_SITE_OTHER): Payer: Self-pay

## 2013-10-30 ENCOUNTER — Encounter: Payer: Self-pay | Admitting: Family

## 2013-10-30 VITALS — BP 126/78 | HR 94 | Temp 97.9°F | Resp 20 | Wt 296.0 lb

## 2013-10-30 DIAGNOSIS — I10 Essential (primary) hypertension: Secondary | ICD-10-CM

## 2013-10-30 DIAGNOSIS — Z0000 Encounter for general adult medical examination without abnormal findings: Secondary | ICD-10-CM

## 2013-10-30 DIAGNOSIS — R7611 Nonspecific reaction to tuberculin skin test without active tuberculosis: Secondary | ICD-10-CM

## 2013-10-30 DIAGNOSIS — Z7189 Other specified counseling: Secondary | ICD-10-CM

## 2013-10-30 DIAGNOSIS — Z7185 Encounter for immunization safety counseling: Secondary | ICD-10-CM

## 2013-10-30 DIAGNOSIS — Z23 Encounter for immunization: Secondary | ICD-10-CM

## 2013-10-30 LAB — BASIC METABOLIC PANEL
BUN: 9 mg/dL (ref 6–23)
CHLORIDE: 104 meq/L (ref 96–112)
CO2: 25 meq/L (ref 19–32)
CREATININE: 0.76 mg/dL (ref 0.50–1.10)
Calcium: 9.4 mg/dL (ref 8.4–10.5)
Glucose, Bld: 93 mg/dL (ref 70–99)
POTASSIUM: 3.7 meq/L (ref 3.5–5.3)
Sodium: 140 mEq/L (ref 135–145)

## 2013-10-30 MED ORDER — AMLODIPINE BESYLATE 5 MG PO TABS: 10.0000 mg | ORAL_TABLET | Freq: Every day | ORAL | Status: DC

## 2013-10-30 MED ORDER — HYDROCHLOROTHIAZIDE 25 MG PO TABS: 25.0000 mg | ORAL_TABLET | Freq: Every day | ORAL | Status: DC

## 2013-10-30 NOTE — Progress Notes (Signed)
   Subjective:    Patient ID: Amy Sutton, female    DOB: [birth year], 33 y.o.   MRN: [id number]  HPI  Amy Sutton is a 33 yr old female who presents today for follow up. She is also requesting Hep B/Hep A and varicella immunizations for school.  She needs record of her tetanus and flu shots.   HTN-  Current BP meds include amlodipine and hctz.   BP Readings from Last 3 Encounters:  10/30/13 126/78  10/09/13 150/89  10/02/13 124/82   Denies CP/SOB or swelling.   PPD+ - quantiferon gold was negative.   She was referred to the health department. She was evaluated by them on Monday.  Was told the CXR was abnormal. Their MD is evaluating her for possible antibiotic therapy.     Review of Systems    see HPI  Past Medical History  Diagnosis Date  . Hypertension     History   Social History  . Marital Status: Divorced    Spouse Name: N/A    Number of Children: 1  . Years of Education: N/A   Occupational History  .     Social History Main Topics  . Smoking status: Never Smoker   . Smokeless tobacco: Never Used  . Alcohol Use: 1.5 oz/week    3 drink(s) per week  . Drug Use: Not on file  . Sexual Activity: Not on file   Other Topics Concern  . 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  Family History  Problem Relation Age of Onset  . Arthritis Mother   . Hypertension Mother   . Diabetes Mother   . Hyperlipidemia Mother   . Hypertension Father   . Diabetes Father   . Hyperlipidemia Father   . Cancer Maternal Uncle     prostate  . Cancer Paternal Aunt     No Known Allergies  No current outpatient prescriptions on file prior to visit.   No current facility-administered medications on file prior to visit.    BP 126/78  Pulse 94  Temp(Src) 97.9 F (36.6 C)  (Oral)  Resp 20  Wt 296 lb (134.265 kg)  SpO2 99%  LMP 09/17/2013    Objective:   Physical Exam  Constitutional: She is oriented to person, place, and time. She appears well-developed and well-nourished. No distress.  HENT:  Head: Normocephalic and atraumatic.  Cardiovascular: Normal rate and regular rhythm.   No murmur heard. Pulmonary/Chest: Effort normal and breath sounds normal. No respiratory distress. She has no wheezes. She has no rales. She exhibits no tenderness.  Musculoskeletal: She exhibits no edema.  Neurological: She is alert and oriented to person, place, and time.  Psychiatric: She has a normal mood and affect. Her behavior is normal. Judgment and thought content normal.          Assessment & Plan:

## 2013-10-30 NOTE — Patient Instructions (Addendum)
Please complete lab work prior to leaving. Follow up in 1 month for second Twinrix vaccine and 6 months for 3rd shot. Follow up in 6 months for office visit.

## 2013-10-30 NOTE — Assessment & Plan Note (Signed)
Check varicella titer.  If positive will not immunize.  If neg, will have pt return for vaccine.

## 2013-10-30 NOTE — Assessment & Plan Note (Signed)
Stable on current meds. Continue same, obtain bmet.  

## 2013-10-30 NOTE — Assessment & Plan Note (Signed)
Management per health department.

## 2013-10-30 NOTE — Progress Notes (Signed)
Pre visit review using our clinic review tool, if applicable. No additional management support is needed unless otherwise documented below in the visit note. 

## 2013-10-30 NOTE — Addendum Note (Signed)
Addended by: Mervin KungFERGERSON, Braydon Kullman A on: 10/30/2013 02:30 PM   Modules accepted: Orders

## 2013-10-31 ENCOUNTER — Telehealth: Payer: Self-pay | Admitting: Family

## 2013-10-31 LAB — VARICELLA ZOSTER ANTIBODY, IGG: Varicella IgG: <10 Index (ref <135.00)

## 2013-10-31 NOTE — Telephone Encounter (Signed)
Kidney function is normal.  Titer shows that she did not have exposure to chicken pox.  She should schedule nurse visit please for immunization.  (do we have adult varicella in stock?)

## 2013-11-01 ENCOUNTER — Telehealth: Payer: Self-pay | Admitting: Family

## 2013-11-01 NOTE — Telephone Encounter (Signed)
Relevant patient education assigned to patient using Emmi. ° °

## 2013-11-04 NOTE — Telephone Encounter (Signed)
Notified Amy Sutton of results. Amy Sutton already has nurse visit for 2nd hep B on 11/27/13 and will get varicella at that time. Please advise if you have other recommendation about receiving both vaccines at the same time.

## 2013-11-04 NOTE — Telephone Encounter (Signed)
Should be ok to give together.

## 2013-11-27 ENCOUNTER — Ambulatory Visit (INDEPENDENT_AMBULATORY_CARE_PROVIDER_SITE_OTHER): Payer: BC Managed Care – PPO | Admitting: Family

## 2013-11-27 DIAGNOSIS — Z23 Encounter for immunization: Secondary | ICD-10-CM

## 2014-03-09 ENCOUNTER — Other Ambulatory Visit: Payer: Self-pay | Admitting: Family

## 2014-03-28 ENCOUNTER — Ambulatory Visit: Payer: BC Managed Care – PPO | Admitting: Family

## 2014-04-01 ENCOUNTER — Ambulatory Visit: Payer: BC Managed Care – PPO | Admitting: Family

## 2014-04-02 ENCOUNTER — Ambulatory Visit (INDEPENDENT_AMBULATORY_CARE_PROVIDER_SITE_OTHER): Payer: BC Managed Care – PPO | Admitting: Family

## 2014-04-02 ENCOUNTER — Encounter: Payer: Self-pay | Admitting: Family

## 2014-04-02 VITALS — BP 136/85 | HR 81 | Temp 98.1°F | Resp 16 | Ht 69.0 in | Wt 295.0 lb

## 2014-04-02 DIAGNOSIS — H9209 Otalgia, unspecified ear: Secondary | ICD-10-CM

## 2014-04-02 DIAGNOSIS — H6121 Impacted cerumen, right ear: Secondary | ICD-10-CM

## 2014-04-02 DIAGNOSIS — H9201 Otalgia, right ear: Secondary | ICD-10-CM | POA: Insufficient documentation

## 2014-04-02 DIAGNOSIS — H6120 Impacted cerumen, unspecified ear: Secondary | ICD-10-CM

## 2014-04-02 DIAGNOSIS — I10 Essential (primary) hypertension: Secondary | ICD-10-CM

## 2014-04-02 LAB — BASIC METABOLIC PANEL
BUN: 9 mg/dL (ref 6–23)
CALCIUM: 9.4 mg/dL (ref 8.4–10.5)
CO2: 27 mEq/L (ref 19–32)
Chloride: 104 mEq/L (ref 96–112)
Creat: 0.7 mg/dL (ref 0.50–1.10)
GLUCOSE: 95 mg/dL (ref 70–99)
Potassium: 3.9 mEq/L (ref 3.5–5.3)
Sodium: 140 mEq/L (ref 135–145)

## 2014-04-02 MED ORDER — HYDROCHLOROTHIAZIDE 25 MG PO TABS: 25.0000 mg | ORAL_TABLET | Freq: Every day | ORAL | Status: DC

## 2014-04-02 MED ORDER — AMOXICILLIN 500 MG PO CAPS: 500.0000 mg | ORAL_CAPSULE | Freq: Three times a day (TID) | ORAL | Status: DC

## 2014-04-02 MED ORDER — AMLODIPINE BESYLATE 5 MG PO TABS: ORAL_TABLET | ORAL | Status: DC

## 2014-04-02 NOTE — Patient Instructions (Signed)
Please complete lab work prior to leaving. Start amoxicillin for ear.  Schedule follow up with your dentist. Follow up in 4 months.

## 2014-04-02 NOTE — Progress Notes (Signed)
Pre visit review using our clinic review tool, if applicable. No additional management support is needed unless otherwise documented below in the visit note. 

## 2014-04-02 NOTE — Assessment & Plan Note (Signed)
Ceruminosis is noted.  Wax is removed by syringing and manual debridement.  

## 2014-04-02 NOTE — Assessment & Plan Note (Signed)
Given duration will rx with empiric amoxicillin.  I am suspicious with her dental caries and normal R TM that pain could actually be early dental abscess which is referring to right ear. I have advised her to arrange follow up with her dentist.

## 2014-04-02 NOTE — Progress Notes (Signed)
   Subjective:    Patient ID: Amy Sutton, female    DOB: [birth year], 33 y.o.   MRN: [id number]  HPI  [name] is a 33 yr old female who presents today with chief complaint of right ear pain x 3 weeks.  Reports that pain aches in the ear and radiates down into the jaw area.  She denies associated fever.  Using motrin with temporary relief. Reports associated "muffled hearing" in the right ear only.    Review of Systems See HPI    Past Medical History  Diagnosis Date  . Hypertension     History   Social History  . Marital Status: Divorced    Spouse Name: N/A    Number of Children: 1  . Years of Education: N/A   Occupational History  .     Social History Main Topics  . Smoking status: Never Smoker   . Smokeless tobacco: Never Used  . Alcohol Use: 1.5 oz/week    3 drink(s) per week  . Drug Use: Not on file  . Sexual Activity: Not on file   Other Topics Concern  . 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  Family History  Problem Relation Age of Onset  . Arthritis Mother   . Hypertension Mother   . Diabetes Mother   . Hyperlipidemia Mother   . Hypertension Father   . Diabetes Father   . Hyperlipidemia Father   . Cancer Maternal Uncle     prostate  . Cancer Paternal Aunt     No Known Allergies  Current Outpatient Prescriptions on File Prior to Visit  Medication Sig Dispense Refill  . amLODipine (NORVASC) 5 MG tablet TAKE TWO TABLETS BY MOUTH ONCE DAILY  90 tablet  1  . etonogestrel (NEXPLANON) 68 MG IMPL implant Inject 1 each into the skin once.      . hydrochlorothiazide (HYDRODIURIL) 25 MG tablet Take 1 tablet (25 mg total) by mouth daily.  90 tablet  1   No current facility-administered medications on file prior to visit.    BP 136/85  Pulse 81   Temp(Src) 98.1 F (36.7 C) (Oral)  Resp 16  Ht 5\' 9"  (1.753 m)  Wt 295 lb (133.811 kg)  BMI 43.54 kg/m2  SpO2 98%       Objective:   Physical Exam  Constitutional: She is oriented to person, place, and time. She appears well-developed and well-nourished. No distress.  HENT:  Head: Normocephalic and atraumatic.  R TM partially occlude with cerumen. After cerumen removal with curette, normal TM is revealed.  L TM is pink, + COL, bony landmarks intact  Multiple dental caries are noted  Cardiovascular: Normal rate and regular rhythm.   No murmur heard. Pulmonary/Chest: Effort normal and breath sounds normal. No respiratory distress. She has no wheezes. She has no rales. She exhibits no tenderness.  Neurological: She is alert and oriented to person, place, and time.  Psychiatric: She has a normal mood and affect. Her behavior is normal. Judgment and thought content normal.          Assessment & Plan:

## 2014-04-03 ENCOUNTER — Encounter: Payer: Self-pay | Admitting: Family

## 2014-04-29 ENCOUNTER — Ambulatory Visit (INDEPENDENT_AMBULATORY_CARE_PROVIDER_SITE_OTHER): Payer: BC Managed Care – PPO | Admitting: Family

## 2014-04-29 DIAGNOSIS — Z23 Encounter for immunization: Secondary | ICD-10-CM

## 2014-04-30 ENCOUNTER — Ambulatory Visit: Payer: BC Managed Care – PPO | Admitting: Family

## 2014-07-29 ENCOUNTER — Encounter: Payer: Self-pay | Admitting: Family

## 2014-07-29 ENCOUNTER — Ambulatory Visit (INDEPENDENT_AMBULATORY_CARE_PROVIDER_SITE_OTHER): Payer: BC Managed Care – PPO | Admitting: Family

## 2014-07-29 VITALS — BP 120/78 | HR 94 | Temp 98.1°F | Resp 16 | Ht 69.0 in | Wt 298.0 lb

## 2014-07-29 DIAGNOSIS — R7611 Nonspecific reaction to tuberculin skin test without active tuberculosis: Secondary | ICD-10-CM

## 2014-07-29 DIAGNOSIS — I10 Essential (primary) hypertension: Secondary | ICD-10-CM

## 2014-07-29 NOTE — Progress Notes (Signed)
   Subjective:    Patient ID: Amy Sutton, female    DOB: 04/22/1981, 33 y.o.   MRN: [id number]  HPI  Amy Sutton is a 33 yr old female who presents today for follow up.  1) HTN-  Current BP med includes hctz and amlodipine  Denies CP/SOB or swelling.  BP Readings from Last 3 Encounters:  07/29/14 120/78  04/02/14 136/85  10/30/13 126/78   2) PPD + she is being treated with isoniazid x 9 months per health department and is on her last month.     Review of Systems See HPI Past Medical History  Diagnosis Date  . Hypertension     History   Social History  . Marital Status: Single    Spouse Name: N/A    Number of Children: 1  . Years of Education: N/A   Occupational History  .     Social History Main Topics  . Smoking status: Never Smoker   . Smokeless tobacco: Never Used  . Alcohol Use: 1.5 oz/week    3 drink(s) per week  . Drug Use: Not on file  . Sexual Activity: Not on file   Other Topics Concern  . 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  Family History  Problem Relation Age of Onset  . Arthritis Mother   . Hypertension Mother   . Diabetes Mother   . Hyperlipidemia Mother   . Hypertension Father   . Diabetes Father   . Hyperlipidemia Father   . Cancer Maternal Uncle     prostate  . Cancer Paternal Aunt     No Known Allergies  Current Outpatient Prescriptions on File Prior to Visit  Medication Sig Dispense Refill  . amLODipine (NORVASC) 5 MG tablet TAKE TWO TABLETS BY MOUTH ONCE DAILY 180 tablet 1  . etonogestrel (NEXPLANON) 68 MG IMPL implant Inject 1 each into the skin once.    . hydrochlorothiazide (HYDRODIURIL) 25 MG tablet Take 1 tablet (25 mg total) by mouth daily. 90 tablet 1  . isoniazid (NYDRAZID) 300 MG tablet Take 300 mg by mouth daily.       No current facility-administered medications on file prior to visit.    BP 120/78 mmHg  Pulse 94  Temp(Src) 98.1 F (36.7 C) (Oral)  Resp 16  Ht 5\' 9"  (1.753 m)  Wt 298 lb (135.172 kg)  BMI 43.99 kg/m2  SpO2 100%       Objective:   Physical Exam  Constitutional: She is oriented to person, place, and time. She appears well-developed and well-nourished. No distress.  Cardiovascular: Normal rate and regular rhythm.   No murmur heard. Pulmonary/Chest: Effort normal and breath sounds normal. No respiratory distress. She has no wheezes. She has no rales. She exhibits no tenderness.  Neurological: She is alert and oriented to person, place, and time.  Psychiatric: She has a normal mood and affect. Her behavior is normal. Judgment and thought content normal.          Assessment & Plan:

## 2014-07-29 NOTE — Patient Instructions (Signed)
Please schedule a follow up appointment in 3 months.

## 2014-07-29 NOTE — Progress Notes (Signed)
Pre visit review using our clinic review tool, if applicable. No additional management support is needed unless otherwise documented below in the visit note. 

## 2014-08-02 NOTE — Assessment & Plan Note (Signed)
She is on her final month of isoniazid treatment per health department.

## 2014-08-02 NOTE — Assessment & Plan Note (Signed)
BP stable on current meds. Continue same.  

## 2014-11-03 ENCOUNTER — Ambulatory Visit: Payer: BC Managed Care – PPO | Admitting: Family

## 2014-11-04 ENCOUNTER — Ambulatory Visit (HOSPITAL_BASED_OUTPATIENT_CLINIC_OR_DEPARTMENT_OTHER)
Admission: RE | Admit: 2014-11-04 | Discharge: 2014-11-04 | Disposition: A | Discharge status: Home / Self Care | Payer: BLUE CROSS/BLUE SHIELD | Source: Ambulatory Visit | Attending: Family | Admitting: Family

## 2014-11-04 ENCOUNTER — Ambulatory Visit (INDEPENDENT_AMBULATORY_CARE_PROVIDER_SITE_OTHER): Payer: BLUE CROSS/BLUE SHIELD | Admitting: Family

## 2014-11-04 ENCOUNTER — Encounter: Payer: Self-pay | Admitting: Family

## 2014-11-04 VITALS — BP 144/80 | HR 89 | Temp 98.2°F | Resp 16 | Ht 69.0 in | Wt 301.2 lb

## 2014-11-04 DIAGNOSIS — J069 Acute upper respiratory infection, unspecified: Secondary | ICD-10-CM

## 2014-11-04 DIAGNOSIS — R0989 Other specified symptoms and signs involving the circulatory and respiratory systems: Secondary | ICD-10-CM | POA: Diagnosis not present

## 2014-11-04 DIAGNOSIS — I10 Essential (primary) hypertension: Secondary | ICD-10-CM

## 2014-11-04 DIAGNOSIS — R062 Wheezing: Secondary | ICD-10-CM

## 2014-11-04 DIAGNOSIS — R05 Cough: Secondary | ICD-10-CM | POA: Insufficient documentation

## 2014-11-04 DIAGNOSIS — R7611 Nonspecific reaction to tuberculin skin test without active tuberculosis: Secondary | ICD-10-CM

## 2014-11-04 LAB — BASIC METABOLIC PANEL
BUN: 11 mg/dL (ref 6–23)
CALCIUM: 9.5 mg/dL (ref 8.4–10.5)
CO2: 29 mEq/L (ref 19–32)
Chloride: 106 mEq/L (ref 96–112)
Creatinine, Ser: 0.72 mg/dL (ref 0.40–1.20)
GFR: 120.26 mL/min (ref >60.00)
GLUCOSE: 103 mg/dL — AB (ref 70–99)
POTASSIUM: 3.7 meq/L (ref 3.5–5.1)
Sodium: 139 mEq/L (ref 135–145)

## 2014-11-04 MED ORDER — ALBUTEROL SULFATE HFA 108 (90 BASE) MCG/ACT IN AERS: 2.0000 | INHALATION_SPRAY | Freq: Four times a day (QID) | RESPIRATORY_TRACT | Status: DC | PRN

## 2014-11-04 NOTE — Progress Notes (Signed)
Pre visit review using our clinic review tool, if applicable. No additional management support is needed unless otherwise documented below in the visit note. 

## 2014-11-04 NOTE — Assessment & Plan Note (Signed)
Due to wheezing, will obtain CXR to rule out pna. Add albuterol mdi. Follow up if symptoms worsen or do not improve.

## 2014-11-04 NOTE — Assessment & Plan Note (Signed)
Complete isoniazid.

## 2014-11-04 NOTE — Progress Notes (Signed)
   Subjective:    Patient ID: Amy Sutton, female    DOB: May 29, 1981, 34 y.o.   MRN: [id number]  HPI  1) HTN- Patient is currently maintained on the following medications for blood pressure: norvasc, hctz Patient reports good compliance with blood pressure medications. Patient denies chest pain, shortness of breath or swelling. Last 3 blood pressure readings in our office are as follows: BP Readings from Last 3 Encounters:  11/04/14 144/80  07/29/14 120/78  04/02/14 136/85   Reports bilateral ear pain, denies sinus congestion, denies rhinorrhea. + fatigue last few days.  Denies cough.     PPD+ she completed isoniazid.    Review of Systems    see HPI  Past Medical History  Diagnosis Date  . Hypertension     History   Social History  . Marital Status: Single    Spouse Name: N/A  . Number of Children: 1  . Years of Education: N/A   Occupational History  .     Social History Main Topics  . Smoking status: Never Smoker   . Smokeless tobacco: Never Used  . Alcohol Use: 1.5 oz/week    3 drink(s) per week  . Drug Use: Not on file  . Sexual Activity: Not on file   Other Topics Concern  . 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 No pertinent past surgical history.  Family History  Problem Relation Age of Onset  . Arthritis Mother   . Hypertension Mother   . Diabetes Mother   . Hyperlipidemia Mother   . Hypertension Father   . Diabetes Father   . Hyperlipidemia Father   . Cancer Maternal Uncle     prostate  . Cancer Paternal Aunt     No Known Allergies  Current Outpatient Prescriptions on File Prior to Visit  Medication Sig Dispense Refill  . amLODipine (NORVASC) 5 MG tablet TAKE TWO TABLETS BY MOUTH ONCE DAILY 180 tablet 1  . etonogestrel (NEXPLANON) 68 MG IMPL implant  Inject 1 each into the skin once.    . hydrochlorothiazide (HYDRODIURIL) 25 MG tablet Take 1 tablet (25 mg total) by mouth daily. 90 tablet 1  . isoniazid (NYDRAZID) 300 MG tablet Take 300 mg by mouth daily.     No current facility-administered medications on file prior to visit.    BP 144/80 mmHg  Pulse 89  Temp(Src) 98.2 F (36.8 C) (Oral)  Resp 16  Ht 5\' 9"  (1.753 m)  Wt 301 lb 3.2 oz (136.623 kg)  BMI 44.46 kg/m2  SpO2 99%    Objective:   Physical Exam  Constitutional: She appears well-developed and well-nourished.  HENT:  Right Ear: Tympanic membrane and ear canal normal.  Left Ear: Tympanic membrane and ear canal normal.  Mouth/Throat: No oropharyngeal exudate, posterior oropharyngeal edema or posterior oropharyngeal erythema.  Cardiovascular: Normal rate, regular rhythm and normal heart sounds.   No murmur heard. Pulmonary/Chest: Effort normal. No respiratory distress. She has wheezes in the right upper field.  Psychiatric: She has a normal mood and affect. Her behavior is normal. Judgment and thought content normal.          Assessment & Plan:

## 2014-11-04 NOTE — Patient Instructions (Signed)
Please complete chest x ray on the first floor. Complete lab work prior to [employer].  You may use albuterol every 6 hours as needed for cough/wheezing or shortness of breath. Call if you develop fever >101, if symptoms worsen or if not improved in 3 days.

## 2014-11-04 NOTE — Assessment & Plan Note (Signed)
SBP slightly elevated today.  Will obtain follow up bmet, continue current meds. Reinforced low sodium diet, exercise efforts.  If sbp above goal next visit, consider adjusting meds.

## 2014-11-10 ENCOUNTER — Telehealth: Payer: Self-pay | Admitting: *Deleted

## 2014-11-10 DIAGNOSIS — R7309 Other abnormal glucose: Secondary | ICD-10-CM

## 2014-11-10 NOTE — Telephone Encounter (Signed)
Left detailed message on cell# re: below instructions and to call and schedule lab appt.

## 2014-11-10 NOTE — Telephone Encounter (Signed)
-----   Message from Sandford CrazeMelissa O'Sullivan, NP sent at 11/07/2014  8:31 AM EST ----- Please contact pt- advise her that glucose is elevated, I would like her to complete a1C dx hyperglycemia.

## 2014-11-10 NOTE — Telephone Encounter (Signed)
Lab appointment scheduled for tomorrow morning, please enter orders

## 2014-11-11 ENCOUNTER — Encounter: Payer: Self-pay | Admitting: Family

## 2014-11-11 ENCOUNTER — Other Ambulatory Visit (INDEPENDENT_AMBULATORY_CARE_PROVIDER_SITE_OTHER): Payer: BLUE CROSS/BLUE SHIELD

## 2014-11-11 DIAGNOSIS — R7309 Other abnormal glucose: Secondary | ICD-10-CM

## 2014-11-11 LAB — HEMOGLOBIN A1C: Hgb A1c MFr Bld: 5.4 % (ref 4.6–6.5)

## 2015-01-12 ENCOUNTER — Other Ambulatory Visit: Payer: Self-pay | Admitting: Family

## 2015-02-06 ENCOUNTER — Ambulatory Visit: Payer: BLUE CROSS/BLUE SHIELD | Admitting: Family

## 2015-02-09 ENCOUNTER — Encounter: Payer: Self-pay | Admitting: Family

## 2015-02-09 ENCOUNTER — Ambulatory Visit (INDEPENDENT_AMBULATORY_CARE_PROVIDER_SITE_OTHER): Payer: BLUE CROSS/BLUE SHIELD | Admitting: Family

## 2015-02-09 ENCOUNTER — Telehealth: Payer: Self-pay | Admitting: *Deleted

## 2015-02-09 VITALS — BP 140/80 | HR 90 | Temp 98.2°F | Resp 16 | Ht 69.0 in | Wt 305.0 lb

## 2015-02-09 DIAGNOSIS — L819 Disorder of pigmentation, unspecified: Secondary | ICD-10-CM | POA: Insufficient documentation

## 2015-02-09 DIAGNOSIS — Z0000 Encounter for general adult medical examination without abnormal findings: Secondary | ICD-10-CM

## 2015-02-09 DIAGNOSIS — I10 Essential (primary) hypertension: Secondary | ICD-10-CM

## 2015-02-09 MED ORDER — AMLODIPINE BESYLATE 10 MG PO TABS
10.0000 mg | ORAL_TABLET | Freq: Every day | ORAL | Status: DC
Start: 2015-02-09 — End: 2015-03-10

## 2015-02-09 MED ORDER — METOPROLOL SUCCINATE ER 25 MG PO TB24: 25.0000 mg | ORAL_TABLET | Freq: Every day | ORAL | Status: DC

## 2015-02-09 NOTE — Patient Instructions (Addendum)
Please complete lab work prior to leaving. Increase amlodipine form 5mg  to 10mg .  Follow up in 1 month for a nurse visit BP check. Follow up in 4 months for office visit with provider

## 2015-02-09 NOTE — Addendum Note (Signed)
Addended by: Silvio PateHOMPSON, Alaya Iverson D on: 02/09/2015 08:45 AM   Modules accepted: Orders

## 2015-02-09 NOTE — Telephone Encounter (Signed)
Pt states she was previously taking (2) 5mg  tabs of amlodipine for a total of 10mg  daily.  Rx we sent today was for 10mg  once a day.  Please advise what new dose should be?

## 2015-02-09 NOTE — Progress Notes (Signed)
   Subjective:    Patient ID: Amy Sutton, female    DOB: 10-09-1980, 34 y.o.   MRN: [id number]  HPI  [name] is a 34 yr old female who presents today for follow up.  1) HTN-  Patient is currently maintained on the following medications for blood pressure: amlodipine, hctz.   Patient reports good compliance with blood pressure medications. Patient denies chest pain, shortness of breath or swelling. Last 3 blood pressure readings in our office are as follows:  BP Readings from Last 3 Encounters:  02/09/15 140/80  11/04/14 144/80  07/29/14 120/78   2) Skin discoloration- pt completed isoniazid 6 months ago for + PPD.  Notes discoloration of bilateral LE since taking med. Denies itching   Review of Systems    see HPI  Past Medical History  Diagnosis Date  . Hypertension     History   Social History  . Marital Status: Single    Spouse Name: N/A  . Number of Children: 1  . Years of Education: N/A   Occupational History  .     Social History Main Topics  . Smoking status: Never Smoker   . Smokeless tobacco: Never Used  . Alcohol Use: 1.5 oz/week    3 drink(s) per week  . Drug Use: Not on file  . Sexual Activity: Not on file   Other Topics Concern  . 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  Family History  Problem Relation Age of Onset  . Arthritis Mother   . Hypertension Mother   . Diabetes Mother   . Hyperlipidemia Mother   . Hypertension Father   . Diabetes Father   . Hyperlipidemia Father   . Cancer Maternal Uncle     prostate  . Cancer Paternal Aunt     No Known Allergies  Current Outpatient Prescriptions on File Prior to Visit  Medication Sig Dispense Refill  . etonogestrel (NEXPLANON) 68 MG IMPL implant Inject 1 each into the skin once.    .  hydrochlorothiazide (HYDRODIURIL) 25 MG tablet TAKE ONE TABLET BY MOUTH ONCE DAILY 90 tablet 0   No current facility-administered medications on file prior to visit.    BP 140/80 mmHg  Pulse 90  Temp(Src) 98.2 F (36.8 C) (Oral)  Resp 16  Ht 5\' 9"  (1.753 m)  Wt 305 lb (138.347 kg)  BMI 45.02 kg/m2  SpO2 99%    Objective:   Physical Exam  Constitutional: She is oriented to person, place, and time. She appears well-developed and well-nourished.  HENT:  Head: Normocephalic.  Cardiovascular: Normal rate, regular rhythm and normal heart sounds.   No murmur heard. Pulmonary/Chest: Effort normal and breath sounds normal. No respiratory distress. She has no wheezes.  Neurological: She is alert and oriented to person, place, and time.  Skin: Skin is warm and dry.  Some hyperpigmentation noted bilateral shins  Psychiatric: She has a normal mood and affect. Her behavior is normal. Judgment and thought content normal.          Assessment & Plan:

## 2015-02-09 NOTE — Telephone Encounter (Signed)
I thought she was only taking 1 tab (5 mg).  I would like her to continue 10mg  (1 tab by mouth once daily) + hctz 25 mg and add toprol xl 25mg  once daily. Should still follow up in 2 weeks for nurse visit.

## 2015-02-09 NOTE — Progress Notes (Signed)
Pre visit review using our clinic review tool, if applicable. No additional management support is needed unless otherwise documented below in the visit note. 

## 2015-02-09 NOTE — Assessment & Plan Note (Signed)
Discussed that this is not dangerous.  Mainly cosmetic.  I am not sure if it is related to isoniazid or not.

## 2015-02-09 NOTE — Telephone Encounter (Signed)
Notified pt and she voices understanding. 

## 2015-02-09 NOTE — Assessment & Plan Note (Signed)
Uncontrolled, increase amlodipine from 5mg to 10mg.  

## 2015-02-10 ENCOUNTER — Encounter: Payer: Self-pay | Admitting: Family

## 2015-02-10 LAB — HIV ANTIBODY (ROUTINE TESTING W REFLEX): HIV 1&2 Ab, 4th Generation: NONREACTIVE

## 2015-03-10 ENCOUNTER — Telehealth: Payer: Self-pay | Admitting: Family

## 2015-03-10 ENCOUNTER — Ambulatory Visit (INDEPENDENT_AMBULATORY_CARE_PROVIDER_SITE_OTHER): Payer: BLUE CROSS/BLUE SHIELD | Admitting: Family

## 2015-03-10 VITALS — BP 120/78 | HR 79

## 2015-03-10 DIAGNOSIS — I10 Essential (primary) hypertension: Secondary | ICD-10-CM | POA: Diagnosis not present

## 2015-03-10 MED ORDER — METOPROLOL SUCCINATE ER 25 MG PO TB24: 50.0000 mg | ORAL_TABLET | Freq: Every day | ORAL | Status: DC

## 2015-03-10 MED ORDER — AMLODIPINE BESYLATE 10 MG PO TABS: 5.0000 mg | ORAL_TABLET | Freq: Every day | ORAL | Status: DC

## 2015-03-10 NOTE — Telephone Encounter (Signed)
I reviewed nurse visit and pt complaints.  I advise the following:   Given c/o dry mouth and ankle swelling, advise cut amlodipine back down to  and increase metoprolol from  to . Repeat bp in 2 weeks at nurse visit.

## 2015-03-10 NOTE — Assessment & Plan Note (Signed)
Given c/o dry mouth and ankle swelling, advise cut amlodipine back down to  and increase metoprolol from  to . Repeat bp in 2 weeks.

## 2015-03-10 NOTE — Progress Notes (Addendum)
Pre visit review using our clinic review tool, if applicable. No additional management support is needed unless otherwise documented below in the visit note.\  Patient presents today for BP check per patient instructions from office note 02/09/15:   Patient Instructions     Please complete lab work prior to leaving. Increase amlodipine form 5mg  to 10mg .  Follow up in 1 month for a nurse visit BP check. Follow up in 4 months for office visit with provider   Patient states she has noticed increased ankle swelling and has dry mouth.   BP 120/78 with HR 79.

## 2015-03-10 NOTE — Telephone Encounter (Signed)
Notified patient of recommendations- patient stated that her metoprolol had been increased previously and she was unsure which dose she was currently taking.  She will call the office back today to let us know the dosage.

## 2015-03-10 NOTE — Addendum Note (Signed)
Addended by: [name]'SULLIVAN, Thatcher Doberstein on: 03/10/2015 10:58 AM   Modules accepted: Level of Service

## 2015-03-10 NOTE — Telephone Encounter (Signed)
Patient notified and stated understanding.  BP recheck scheduled for 02/22/15.

## 2015-03-10 NOTE — Telephone Encounter (Signed)
Pt called back and states metoprolol succinate , pt in need of clarification regarding frequency. Please advise

## 2015-03-10 NOTE — Addendum Note (Signed)
Addended by: [name]'SULLIVAN, Jaunice Mirza on: 03/10/2015 10:54 AM   Modules accepted: Orders, Level of Service

## 2015-03-20 IMAGING — CR DG LUMBAR SPINE COMPLETE 4+V
5 series · 5 of 5 positions shown · non-contrast
Comparison: None.

CLINICAL DATA: Back and left leg pain.

LUMBAR SPINE - COMPLETE 4+ VIEW

[t l-spine a.p.]
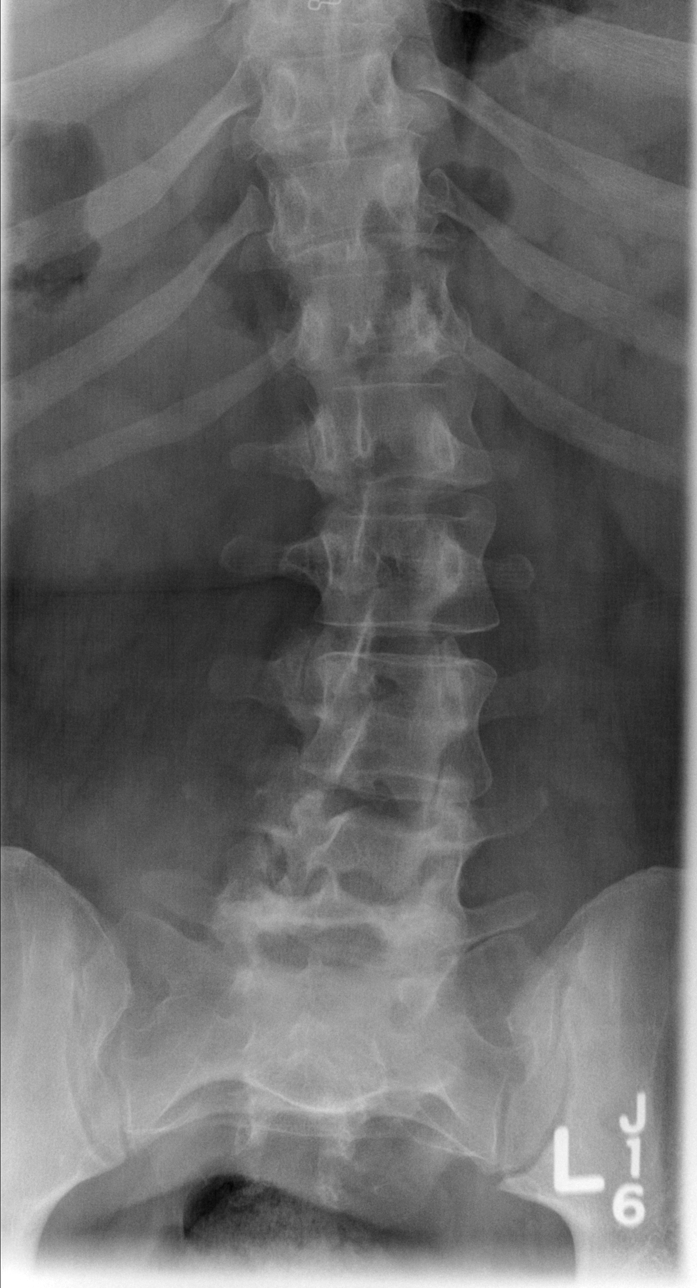

[t l-spine oblique exposure (1 of 2)]
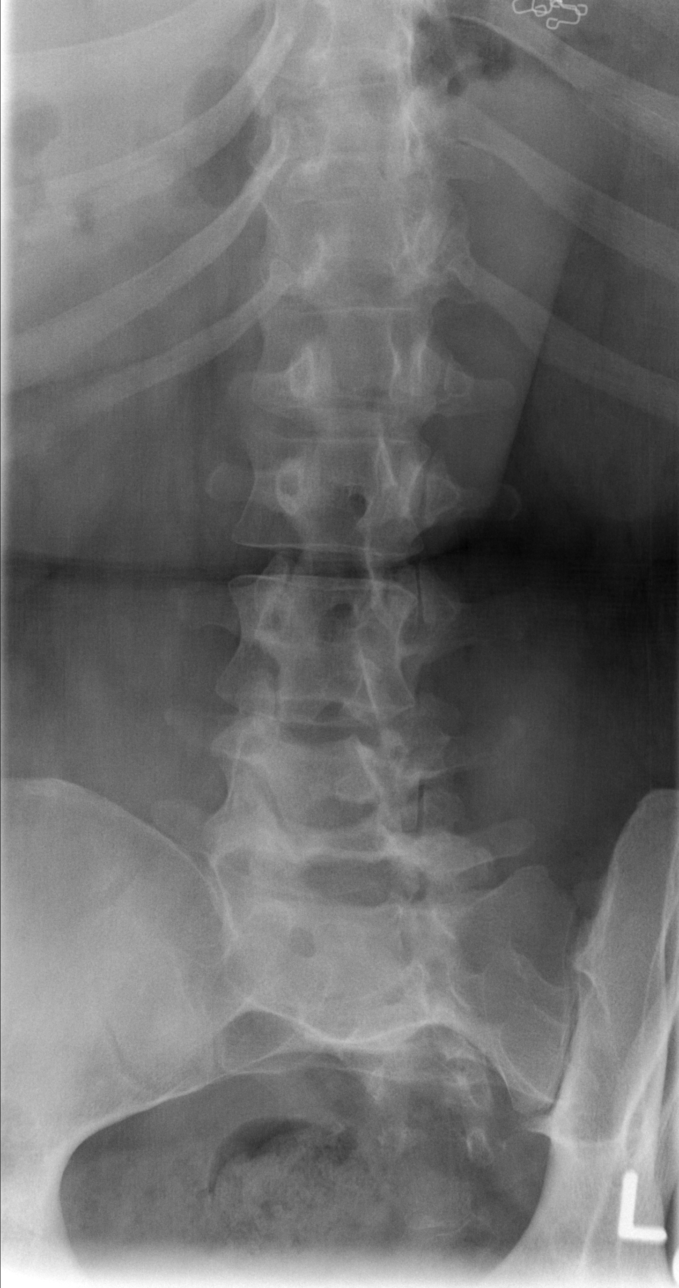

[t l-spine oblique exposure (2 of 2)]
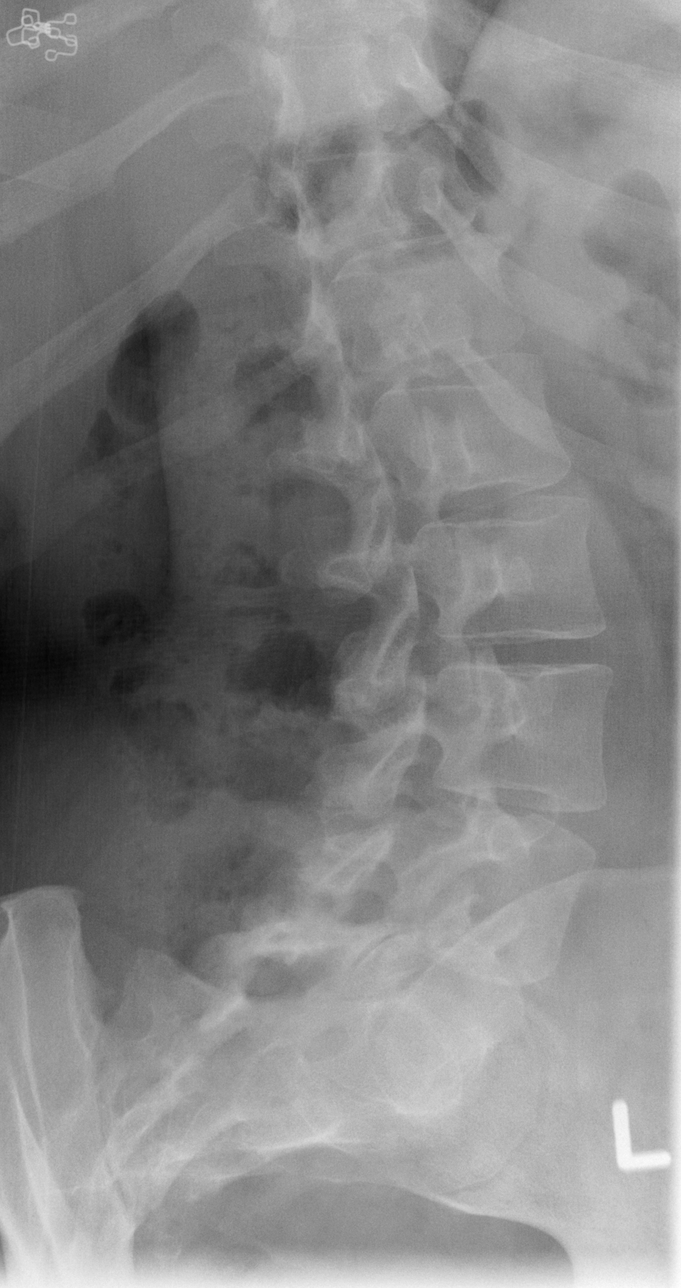

[t l-spine lat]
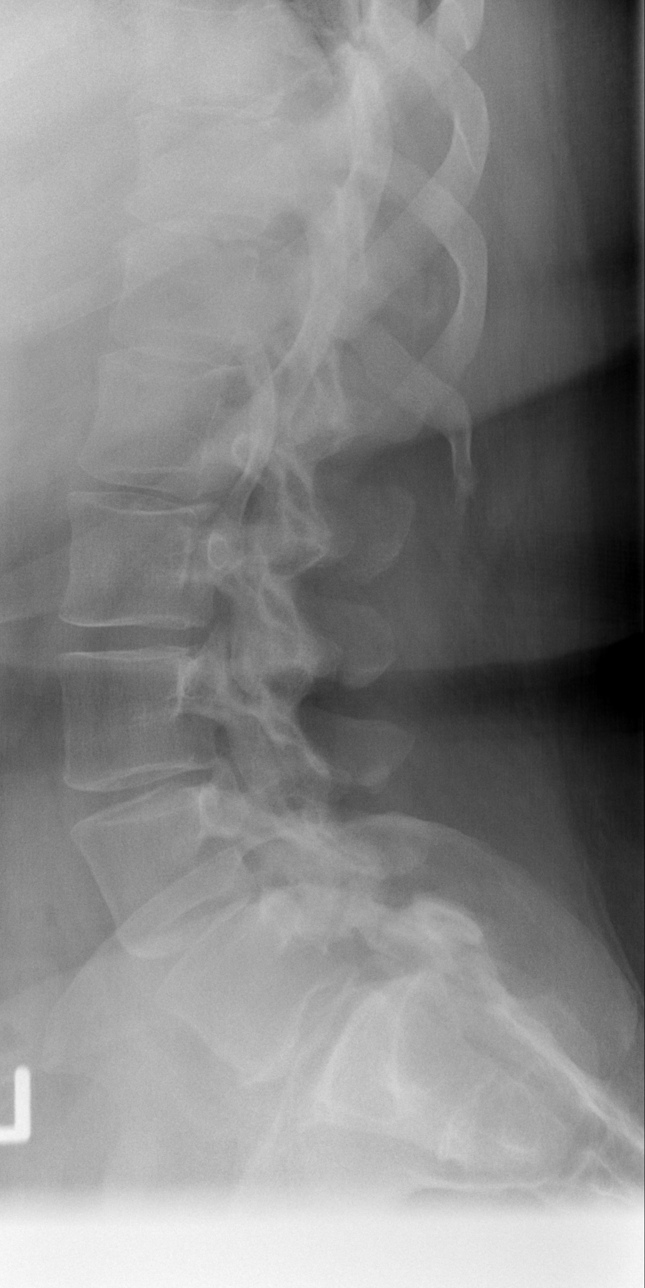

[t l-spine l5-s1 spot]
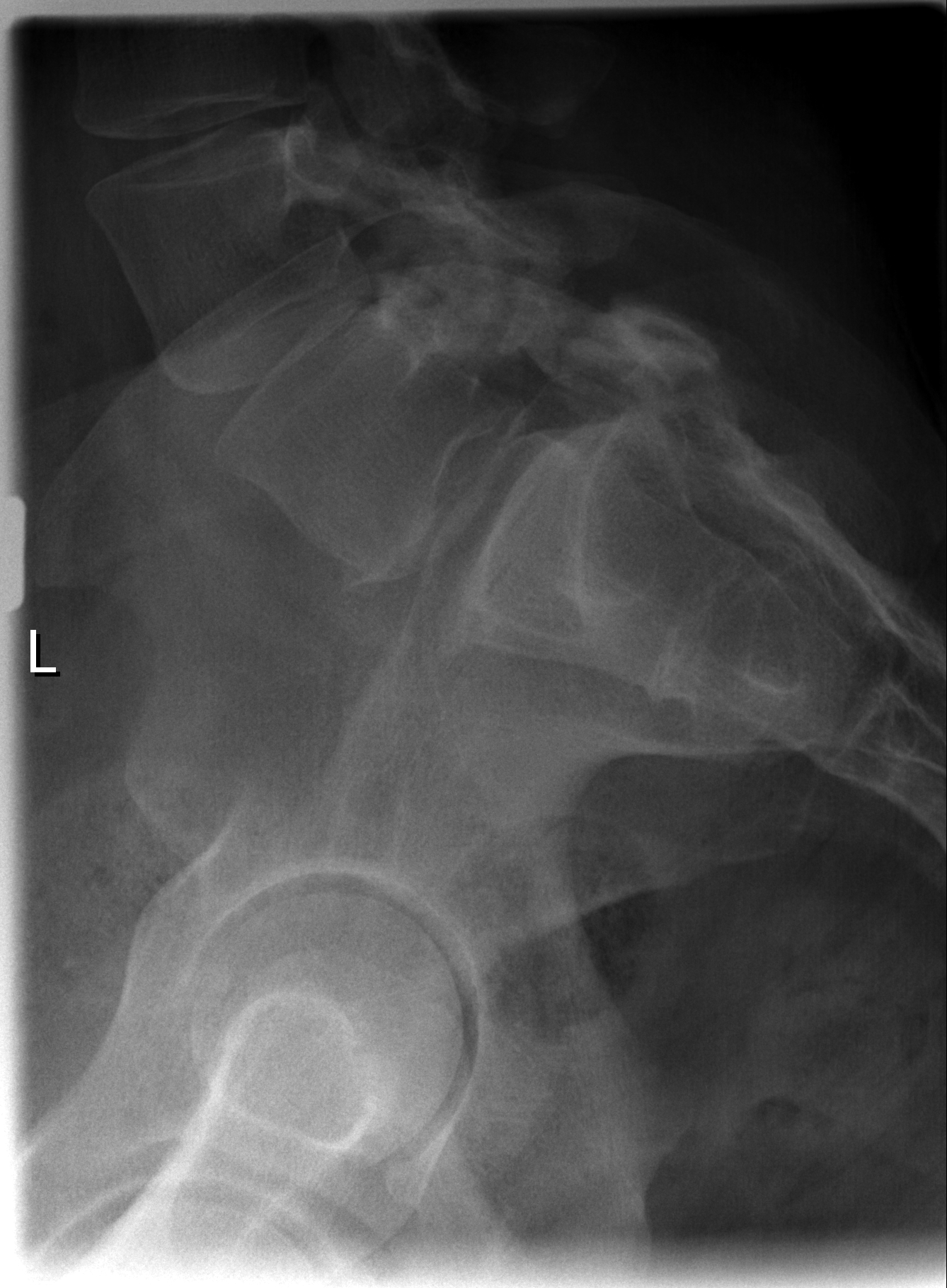

[5 of 5 positions shown; findings below may reference images not displayed]

FINDINGS: Moderate lumbar rotary scoliosis convex left of 18
degrees.  Slight disc space narrowing L4-5 and L5-S1.  Lower lumbar
facet arthropathy without definite pars defects.  Unremarkable
sacrum.
IMPRESSION: Scoliosis as described.  Mild degenerative disc disease.

## 2015-03-24 ENCOUNTER — Ambulatory Visit (INDEPENDENT_AMBULATORY_CARE_PROVIDER_SITE_OTHER): Payer: BLUE CROSS/BLUE SHIELD | Admitting: Family Medicine

## 2015-03-24 ENCOUNTER — Encounter: Payer: Self-pay | Admitting: Family Medicine

## 2015-03-24 VITALS — BP 147/88 | HR 80

## 2015-03-24 DIAGNOSIS — Z0130 Encounter for examination of blood pressure without abnormal findings: Secondary | ICD-10-CM

## 2015-03-24 DIAGNOSIS — I10 Essential (primary) hypertension: Secondary | ICD-10-CM

## 2015-03-24 NOTE — Progress Notes (Signed)
Pt had not taken her meds today.  Dennie Bibleat was advised to take meds and make appointment to see PcP in 2-3 weeks.

## 2015-03-24 NOTE — Progress Notes (Signed)
Pre visit review using our clinic review tool, if applicable. No additional management support is needed unless otherwise documented below in the visit note.  Patient presents for B/P check per provider's note below:   Sandford CrazeMelissa O'Sullivan, NP at 03/10/2015 10:54 AM     Status: Signed       Expand All Collapse All   I reviewed nurse visit and pt complaints. I advise the following:  Given c/o dry mouth and ankle swelling, advise cut amlodipine back down to 5mg  and increase metoprolol from 25mg  to 50mg . Repeat bp in 2 weeks at nurse visit.        Patient reports that she is no longer having ankle swelling or dry mouth since the recent changes with her medications.  Readings were B/P 148/90 P90 and B/P 147/88 P80.  Per Dr. Laury AxonLowne advise patient to continue with her current medication regimen and follow-up with her provider in the next 2-3 weeks.  Informed patient of these instructions and she understood and did not have any further questions or concerns.

## 2015-04-14 ENCOUNTER — Encounter: Payer: Self-pay | Admitting: Family

## 2015-04-14 ENCOUNTER — Ambulatory Visit (INDEPENDENT_AMBULATORY_CARE_PROVIDER_SITE_OTHER): Payer: BLUE CROSS/BLUE SHIELD | Admitting: Family

## 2015-04-14 VITALS — BP 130/88 | HR 89 | Temp 98.8°F | Resp 16 | Ht 69.0 in | Wt 309.4 lb

## 2015-04-14 DIAGNOSIS — I10 Essential (primary) hypertension: Secondary | ICD-10-CM | POA: Diagnosis not present

## 2015-04-14 LAB — BASIC METABOLIC PANEL
BUN: 12 mg/dL (ref 6–23)
CALCIUM: 9.6 mg/dL (ref 8.4–10.5)
CHLORIDE: 106 meq/L (ref 96–112)
CO2: 27 mEq/L (ref 19–32)
Creatinine, Ser: 0.75 mg/dL (ref 0.40–1.20)
GFR: 114.41 mL/min (ref >60.00)
GLUCOSE: 117 mg/dL — AB (ref 70–99)
Potassium: 3.7 mEq/L (ref 3.5–5.1)
SODIUM: 140 meq/L (ref 135–145)

## 2015-04-14 MED ORDER — METOPROLOL SUCCINATE ER 25 MG PO TB24: 50.0000 mg | ORAL_TABLET | Freq: Every day | ORAL | Status: DC

## 2015-04-14 MED ORDER — AMLODIPINE BESYLATE 5 MG PO TABS: 5.0000 mg | ORAL_TABLET | Freq: Every day | ORAL | Status: DC

## 2015-04-14 MED ORDER — HYDROCHLOROTHIAZIDE 25 MG PO TABS: 25.0000 mg | ORAL_TABLET | Freq: Every day | ORAL | Status: DC

## 2015-04-14 NOTE — Progress Notes (Signed)
Pre visit review using our clinic review tool, if applicable. No additional management support is needed unless otherwise documented below in the visit note. 

## 2015-04-14 NOTE — Patient Instructions (Signed)
Please complete lab work prior to leaving.   

## 2015-04-14 NOTE — Progress Notes (Signed)
   Subjective:    Patient ID: Amy Sutton, female    DOB: 08-04-1981, 34 y.o.   MRN: [id number]  HPI  Amy Sutton is a 34 yr old female who presents today for follow up.  Patient is currently maintained on the following medications for blood pressure: hctz, amlodipine (decreased from  to ), toprol xl (increased from  to ). Patient reports good compliance with blood pressure medications.Last 3 blood pressure readings in our office are as follows: BP Readings from Last 3 Encounters:  04/14/15 130/88  03/24/15 147/88  03/10/15 120/78   Dry mouth and swelling are better.  Review of Systems See HPI  Past Medical History  Diagnosis Date  . Hypertension     History   Social History  . Marital Status: Single    Spouse Name: N/A  . Number of Children: 1  . Years of Education: N/A   Occupational History  .     Social History Main Topics  . Smoking status: Never Smoker   . Smokeless tobacco: Never Used  . Alcohol Use: 1.5 oz/week    3 drink(s) per week  . Drug Use: Not on file  . Sexual Activity: Not on file   Other Topics Concern  . 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  Family History  Problem Relation Age of Onset  . Arthritis Mother   . Hypertension Mother   . Diabetes Mother   . Hyperlipidemia Mother   . Hypertension Father   . Diabetes Father   . Hyperlipidemia Father   . Cancer Maternal Uncle     prostate  . Cancer Paternal Aunt     No Known Allergies  Current Outpatient Prescriptions on File Prior to Visit  Medication Sig Dispense Refill  . etonogestrel (NEXPLANON) 68 MG IMPL implant Inject 1 each into the skin once.     No current facility-administered medications on file prior to visit.    BP 130/88 mmHg  Pulse 89  Temp(Src) 98.8  F (37.1 C) (Oral)  Resp 16  Ht  (1.753 m)  Wt 309 lb 6.4 oz (140.343 kg)  BMI 45.67 kg/m2  SpO2 98%       Objective:   Physical Exam  Constitutional: She is oriented to person, place, and time. She appears well-developed and well-nourished.  HENT:  Head: Normocephalic and atraumatic.  Cardiovascular: Normal rate, regular rhythm and normal heart sounds.   No murmur heard. Pulmonary/Chest: Effort normal and breath sounds normal. No respiratory distress. She has no wheezes.  Musculoskeletal: She exhibits no edema.  Neurological: She is alert and oriented to person, place, and time.  Skin: Skin is warm and dry.  Psychiatric: She has a normal mood and affect. Her behavior is normal. Judgment and thought content normal.          Assessment & Plan:

## 2015-04-14 NOTE — Assessment & Plan Note (Signed)
Tolerating current meds. BP is improved. Obtain bmet.

## 2015-06-12 ENCOUNTER — Ambulatory Visit: Payer: BLUE CROSS/BLUE SHIELD | Admitting: Family

## 2015-08-27 ENCOUNTER — Telehealth: Payer: Self-pay | Admitting: Family

## 2015-09-30 NOTE — Telephone Encounter (Signed)
flu

## 2015-10-02 ENCOUNTER — Ambulatory Visit (INDEPENDENT_AMBULATORY_CARE_PROVIDER_SITE_OTHER): Payer: BLUE CROSS/BLUE SHIELD | Admitting: Medical

## 2015-10-02 ENCOUNTER — Encounter: Payer: Self-pay | Admitting: Medical

## 2015-10-02 VITALS — BP 130/84 | HR 97 | Temp 98.1°F | Ht 69.0 in | Wt 308.0 lb

## 2015-10-02 DIAGNOSIS — J209 Acute bronchitis, unspecified: Secondary | ICD-10-CM

## 2015-10-02 DIAGNOSIS — R05 Cough: Secondary | ICD-10-CM

## 2015-10-02 DIAGNOSIS — R059 Cough, unspecified: Secondary | ICD-10-CM

## 2015-10-02 MED ORDER — AZITHROMYCIN 250 MG PO TABS: ORAL_TABLET | ORAL | Status: DC

## 2015-10-02 MED ORDER — HYDROCODONE-HOMATROPINE 5-1.5 MG/5ML PO SYRP: 5.0000 mL | ORAL_SOLUTION | Freq: Three times a day (TID) | ORAL | Status: DC | PRN

## 2015-10-02 MED ORDER — ALBUTEROL SULFATE HFA 108 (90 BASE) MCG/ACT IN AERS: 2.0000 | INHALATION_SPRAY | Freq: Four times a day (QID) | RESPIRATORY_TRACT | Status: DC | PRN

## 2015-10-02 NOTE — Patient Instructions (Addendum)
You appear to have bronchitis. Rest hydrate and tylenol for fever. I am prescribing hycodan cough medicine, and azithromcyin antibiotic. If nasal congestion returns you could use otc the counter nasal steroid.  You should gradually get better. If not then notify us and would recommend a chest xray.  If you have any wheezing or shortness of breath use your inhaler which you have at home.   Follow up in 7-10 days or as needed

## 2015-10-02 NOTE — Progress Notes (Signed)
Subjective:    Patient ID: Amy Sutton, female    DOB: Apr 04, 1981, 35 y.o.   MRN: [id number]  HPI  Pt has been sick for over a week. She was nasal congested for a week. Her nose cleared up but her chest congestion has not cleared.   When she coughed early on brought up mucous. Pt has had dry cough on and off. No wheezing presently. Pt only used inhalers one time in her life(last year).  Pt had mild sweat about one week ago at onset of symptoms. No fever or chills.  Pt has used muxinex but did not resolve symptoms.  LMP-pt on nexplanon.   Review of Systems  Constitutional: Negative for fever, chills and fatigue.  HENT: Positive for congestion. Negative for ear discharge, ear pain, postnasal drip, sinus pressure and sore throat.   Respiratory: Positive for cough. Negative for shortness of breath and wheezing.   Cardiovascular: Negative for chest pain and palpitations.  Gastrointestinal: Negative for abdominal pain.   Past Medical History  Diagnosis Date  . Hypertension     Social History   Social History  . Marital Status: Single    Spouse Name: N/A  . Number of Children: 1  . Years of Education: N/A   Occupational History  .     Social History Main Topics  . Smoking status: Never Smoker   . Smokeless tobacco: Never Used  . Alcohol Use: 1.5 oz/week    3 drink(s) per week  . Drug Use: Not on file  . Sexual Activity: Not on file   Other Topics Concern  . 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  Family History  Problem Relation Age of Onset  . Arthritis Mother   . Hypertension Mother   . Diabetes Mother   . Hyperlipidemia Mother   . Hypertension Father   . Diabetes Father   . Hyperlipidemia Father   . Cancer Maternal Uncle     prostate  . Cancer Paternal  Aunt     No Known Allergies  Current Outpatient Prescriptions on File Prior to Visit  Medication Sig Dispense Refill  . amLODipine (NORVASC) 5 MG tablet Take 1 tablet (5 mg total) by mouth daily. 90 tablet 1  . etonogestrel (NEXPLANON) 68 MG IMPL implant Inject 1 each into the skin once.    . hydrochlorothiazide (HYDRODIURIL) 25 MG tablet Take 1 tablet (25 mg total) by mouth daily. 90 tablet 1  . metoprolol succinate (TOPROL-XL) 25 MG 24 hr tablet Take 2 tablets (50 mg total) by mouth daily. 180 tablet 1   No current facility-administered medications on file prior to visit.    BP 130/84 mmHg  Pulse 97  Temp(Src) 98.1 F (36.7 C) (Oral)  Ht  (1.753 m)  Wt 308 lb (139.708 kg)  BMI 45.46 kg/m2  SpO2 97%       Objective:   Physical Exam  General  Mental Status - Alert. General Appearance - Well groomed. Not in acute distress.  Skin Rashes- No Rashes.  HEENT Head- Normal. Ear Auditory Canal - Left- Normal. Right - Normal.Tympanic Membrane- Left- Normal. Right- Normal. Eye Sclera/Conjunctiva- Left- Normal. Right- Normal. Nose &  Sinuses Nasal Mucosa- Left-  Boggy and Congested. Right-  Boggy and  Congested.Bilateral no  maxillary and no  frontal sinus pressure. Mouth & Throat Lips: Upper Lip- Normal: no dryness, cracking, pallor, cyanosis, or vesicular eruption. Lower Lip-Normal: no dryness, cracking, pallor, cyanosis or vesicular eruption. Buccal Mucosa- Bilateral- No Aphthous ulcers. Oropharynx- No Discharge or Erythema. Tonsils: Characteristics- Bilateral- No Erythema or Congestion. Size/Enlargement- Bilateral- No enlargement. Discharge- bilateral-None.  Neck Neck- Supple. No Masses.   Chest and Lung Exam Auscultation: Breath Sounds:-Clear even and unlabored. Faint upper lobe rhonchi  Cardiovascular Auscultation:Rythm- Regular, rate and rhythm. Murmurs & Other Heart Sounds:Ausculatation of the heart reveal- No Murmurs.  Lymphatic Head & Neck General  Head & Neck Lymphatics: Bilateral: Description- No Localized lymphadenopathy.       Assessment & Plan:  You appear to have bronchitis. Rest hydrate and tylenol for fever. I am prescribing hycodan cough medicine, and azithromcyin antibiotic. If nasal congestion returns you could use otc the counter nasal steroid.  You should gradually get better. If not then notify us and would recommend a chest xray.  If you have any wheezing or shortness of breath use your inhaler which you have at home.   Follow up in 7-10 days or as needed

## 2015-10-02 NOTE — Progress Notes (Signed)
Pre visit review using our clinic review tool, if applicable. No additional management support is needed unless otherwise documented below in the visit note. 

## 2015-10-13 ENCOUNTER — Ambulatory Visit: Payer: BLUE CROSS/BLUE SHIELD | Admitting: Family

## 2015-10-16 ENCOUNTER — Encounter: Payer: Self-pay | Admitting: Family

## 2015-10-16 ENCOUNTER — Ambulatory Visit (INDEPENDENT_AMBULATORY_CARE_PROVIDER_SITE_OTHER): Payer: BLUE CROSS/BLUE SHIELD | Admitting: Family

## 2015-10-16 VITALS — BP 146/80 | HR 94 | Temp 98.4°F | Resp 18 | Ht 69.0 in | Wt 310.0 lb

## 2015-10-16 DIAGNOSIS — R739 Hyperglycemia, unspecified: Secondary | ICD-10-CM | POA: Diagnosis not present

## 2015-10-16 DIAGNOSIS — R05 Cough: Secondary | ICD-10-CM

## 2015-10-16 DIAGNOSIS — I10 Essential (primary) hypertension: Secondary | ICD-10-CM

## 2015-10-16 DIAGNOSIS — R059 Cough, unspecified: Secondary | ICD-10-CM

## 2015-10-16 LAB — BASIC METABOLIC PANEL
BUN: 10 mg/dL (ref 6–23)
CHLORIDE: 104 meq/L (ref 96–112)
CO2: 29 mEq/L (ref 19–32)
Calcium: 9.5 mg/dL (ref 8.4–10.5)
Creatinine, Ser: 0.73 mg/dL (ref 0.40–1.20)
GFR: 117.67 mL/min (ref >60.00)
Glucose, Bld: 109 mg/dL — ABNORMAL HIGH (ref 70–99)
POTASSIUM: 3.6 meq/L (ref 3.5–5.1)
SODIUM: 139 meq/L (ref 135–145)

## 2015-10-16 LAB — HEMOGLOBIN A1C: HEMOGLOBIN A1C: 5.5 % (ref 4.6–6.5)

## 2015-10-16 NOTE — Progress Notes (Signed)
Pre visit review using our clinic review tool, if applicable. No additional management support is needed unless otherwise documented below in the visit note. 

## 2015-10-16 NOTE — Progress Notes (Signed)
Subjective:    Patient ID: Amy Sutton, female    DOB: Apr 04, 1981, 35 y.o.   MRN: [id number]  HPI  Amy Sutton presents for follow up of her blood pressure.  1) HTN- She reports compliance with BP meds.   BP Readings from Last 3 Encounters:  10/16/15 146/80  10/02/15 130/84  04/14/15 130/88   Follow up BP 140/84.    2) Cough- Patient saw Kristine Garbe on 10/02/15 and was treated with zpak, albuterol hydrocodone.  Notes cough improved, overall feeling better but has had recurrence of cough the last few days. Denies fever.  Notes mild sore throat.    Review of Systems  Constitutional: Negative for unexpected weight change.  Eyes: Negative for visual disturbance.  Respiratory: Positive for cough. Negative for shortness of breath.   Cardiovascular: Negative for chest pain, palpitations and leg swelling.  Genitourinary: Negative for dysuria.  Neurological: Negative for dizziness and headaches.   Past Medical History  Diagnosis Date  . Hypertension     Social History   Social History  . Marital Status: Single    Spouse Name: N/A  . Number of Children: 1  . Years of Education: N/A   Occupational History  .     Social History Main Topics  . Smoking status: Never Smoker   . Smokeless tobacco: Never Used  . Alcohol Use: 1.5 oz/week    3 drink(s) per week  . Drug Use: Not on file  . Sexual Activity: Not on file   Other Topics Concern  . 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  Family History  Problem Relation Age of Onset  . Arthritis Mother   . Hypertension Mother   . Diabetes Mother   . Hyperlipidemia Mother   . Hypertension Father   . Diabetes Father   . Hyperlipidemia Father   . Cancer Maternal Uncle     prostate  . Cancer Paternal Aunt     No Known  Allergies  Current Outpatient Prescriptions on File Prior to Visit  Medication Sig Dispense Refill  . albuterol (PROVENTIL HFA;VENTOLIN HFA) 108 (90 Base) MCG/ACT inhaler Inhale 2 puffs into the lungs every 6 (six) hours as needed for wheezing or shortness of breath. 1 Inhaler 0  . amLODipine (NORVASC) 5 MG tablet Take 1 tablet (5 mg total) by mouth daily. 90 tablet 1  . etonogestrel (NEXPLANON) 68 MG IMPL implant Inject 1 each into the skin once.    . hydrochlorothiazide (HYDRODIURIL) 25 MG tablet Take 1 tablet (25 mg total) by mouth daily. 90 tablet 1  . metoprolol succinate (TOPROL-XL) 25 MG 24 hr tablet Take 2 tablets (50 mg total) by mouth daily. 180 tablet 1   No current facility-administered medications on file prior to visit.    BP 146/80 mmHg  Pulse 94  Temp(Src) 98.4 F (36.9 C) (Oral)  Resp 18  Ht  (1.753 m)  Wt 310 lb (140.615 kg)  BMI 45.76 kg/m2  SpO2 100%       Objective:   Physical Exam  Constitutional: She is oriented to person, place, and time. She appears well-developed and well-nourished.  HENT:  Head: Normocephalic and atraumatic.  Right Ear: Tympanic membrane and ear canal normal.  Left Ear: Tympanic membrane and ear canal normal.  Mouth/Throat: Oropharyngeal exudate present.  Cardiovascular: Normal rate, regular rhythm and normal heart sounds.   No murmur heard. Pulmonary/Chest: Effort normal and breath sounds normal. No respiratory distress. She has no wheezes.  Lymphadenopathy:    She has no cervical adenopathy.  Neurological: She is alert and oriented to person, place, and time.  Psychiatric: She has a normal mood and affect. Her behavior is normal. Judgment and thought content normal.          Assessment & Plan:  Cough- mild, advised pt to call if she develops fever or if cough worsens. Otherwise, I suspect that this is residual.  Rapid strep is negative today.  Hyperglycemia- will repeat A1C.

## 2015-10-16 NOTE — Assessment & Plan Note (Signed)
BP up a bit today. Has been better on previous visits. Will have pt return in 2 weeks for nurse visit BP recheck. If stil >140/90, then will adjust meds.

## 2015-10-16 NOTE — Patient Instructions (Signed)
Call if you develop fever >101, if worsening cough. Follow up in 2 weeks for nurse visit to recheck your blood pressure. Follow up in 3 months for routine office visit.

## 2015-10-30 ENCOUNTER — Ambulatory Visit (INDEPENDENT_AMBULATORY_CARE_PROVIDER_SITE_OTHER): Payer: BLUE CROSS/BLUE SHIELD | Admitting: Family

## 2015-10-30 VITALS — BP 138/82 | HR 85

## 2015-10-30 DIAGNOSIS — I10 Essential (primary) hypertension: Secondary | ICD-10-CM

## 2015-10-30 MED ORDER — METOPROLOL SUCCINATE ER 25 MG PO TB24: 50.0000 mg | ORAL_TABLET | Freq: Every day | ORAL | Status: DC

## 2015-10-30 NOTE — Progress Notes (Signed)
Improved. Monitor.  

## 2015-10-30 NOTE — Progress Notes (Signed)
Pre visit review using our clinic review tool, if applicable. No additional management support is needed unless otherwise documented below in the visit note.  Per 10/16/15 OV note: Follow up in 2 weeks for nurse visit to recheck your blood pressure.  Per [name]: Continue current medication regimen. Follow up OV in 3 months.  Next appt w/ [name]: 01/18/16  Starla Link, RN

## 2015-11-02 ENCOUNTER — Telehealth: Payer: Self-pay | Admitting: *Deleted

## 2015-11-02 NOTE — Telephone Encounter (Signed)
REceived fax from [facility] stating insurance prefers to pay for  tablets of metoprolol, once a day instead of  twice a day.  Please advise if ok to change to  once a day?

## 2015-11-03 MED ORDER — METOPROLOL SUCCINATE ER 50 MG PO TB24: 50.0000 mg | ORAL_TABLET | Freq: Every day | ORAL | Status: DC

## 2015-11-03 NOTE — Telephone Encounter (Signed)
Rx sent for Toprol XL  once a day. Mychart message sent to pt.

## 2015-11-03 NOTE — Telephone Encounter (Signed)
If it is Toprol xl  it can be given once daily. Standard release metoprolol should be dosed bid.  If standard release  then we should cut in half and do  1/2 tab PO BID.

## 2015-12-04 NOTE — Telephone Encounter (Signed)
Received notice that pt has not read below mychart message yet. Called and verified that pt was aware of change with medication.

## 2015-12-05 ENCOUNTER — Other Ambulatory Visit: Payer: Self-pay | Admitting: Family

## 2016-01-18 ENCOUNTER — Encounter: Payer: Self-pay | Admitting: Family

## 2016-01-18 ENCOUNTER — Ambulatory Visit (INDEPENDENT_AMBULATORY_CARE_PROVIDER_SITE_OTHER): Payer: BLUE CROSS/BLUE SHIELD | Admitting: Family

## 2016-01-18 VITALS — BP 130/76 | HR 78 | Temp 98.3°F | Resp 18 | Ht 69.0 in | Wt 314.2 lb

## 2016-01-18 DIAGNOSIS — I10 Essential (primary) hypertension: Secondary | ICD-10-CM | POA: Diagnosis not present

## 2016-01-18 LAB — BASIC METABOLIC PANEL
BUN: 8 mg/dL (ref 6–23)
CHLORIDE: 108 meq/L (ref 96–112)
CO2: 27 meq/L (ref 19–32)
CREATININE: 0.68 mg/dL (ref 0.40–1.20)
Calcium: 9.1 mg/dL (ref 8.4–10.5)
GFR: 127.52 mL/min (ref >60.00)
GLUCOSE: 99 mg/dL (ref 70–99)
Potassium: 4 mEq/L (ref 3.5–5.1)
Sodium: 139 mEq/L (ref 135–145)

## 2016-01-18 NOTE — Progress Notes (Signed)
Pre visit review using our clinic review tool, if applicable. No additional management support is needed unless otherwise documented below in the visit note. 

## 2016-01-18 NOTE — Assessment & Plan Note (Signed)
Stable. Continue current meds. Obtain bmet.

## 2016-01-18 NOTE — Progress Notes (Signed)
Subjective:    Patient ID: Amy Sutton, female    DOB: 12-17-1980, 35 y.o.   MRN: [id number]  HPI  Amy Sutton is a 35 yr old female who presents today for follow up.  1) HTN- pt has not taken HCTZ x 2 days. She continues metoprolol. Denies CP/swelling.   BP Readings from Last 3 Encounters:  01/18/16 130/76  10/30/15 138/82  10/16/15 146/80     Review of Systems See HPI  Past Medical History  Diagnosis Date  . Hypertension      Social History   Social History  . Marital Status: Single    Spouse Name: N/A  . Number of Children: 1  . Years of Education: N/A   Occupational History  .     Social History Main Topics  . Smoking status: Never Smoker   . Smokeless tobacco: Never Used  . Alcohol Use: 1.5 oz/week    3 drink(s) per week  . Drug Use: Not on file  . Sexual Activity: Not on file   Other Topics Concern  . 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  Family History  Problem Relation Age of Onset  . Arthritis Mother   . Hypertension Mother   . Diabetes Mother   . Hyperlipidemia Mother   . Hypertension Father   . Diabetes Father   . Hyperlipidemia Father   . Cancer Maternal Uncle     prostate  . Cancer Paternal Aunt     No Known Allergies  Current Outpatient Prescriptions on File Prior to Visit  Medication Sig Dispense Refill  . albuterol (PROVENTIL HFA;VENTOLIN HFA) 108 (90 Base) MCG/ACT inhaler Inhale 2 puffs into the lungs every 6 (six) hours as needed for wheezing or shortness of breath. 1 Inhaler 0  . amLODipine (NORVASC) 5 MG tablet TAKE ONE TABLET BY MOUTH ONCE DAILY 90 tablet 1  . etonogestrel (NEXPLANON) 68 MG IMPL implant Inject 1 each into the skin once.    . hydrochlorothiazide (HYDRODIURIL) 25 MG tablet TAKE ONE TABLET BY MOUTH ONCE  DAILY 90 tablet 1  . metoprolol succinate (TOPROL-XL) 50 MG 24 hr tablet Take 1 tablet (50 mg total) by mouth daily. Take with or immediately following a meal. 90 tablet 1   No current facility-administered medications on file prior to visit.    BP 130/76 mmHg  Pulse 78  Temp(Src) 98.3 F (36.8 C) (Oral)  Resp 18  Ht  (1.753 m)  Wt 314 lb 3.2 oz (142.52 kg)  BMI 46.38 kg/m2       Objective:   Physical Exam  Constitutional: She is oriented to person, place, and time. She appears well-developed and well-nourished.  HENT:  Head: Normocephalic and atraumatic.  Cardiovascular: Normal rate, regular rhythm and normal heart sounds.   No murmur heard. Pulmonary/Chest: Effort normal and breath sounds normal. No respiratory distress. She has no wheezes.  Musculoskeletal: She exhibits no edema.  Neurological: She is alert and oriented to person, place, and time.  Psychiatric: She has a normal mood and affect. Her behavior is normal. Judgment and thought content normal.          Assessment & Plan:  Advised pt to follow up in 6 months.

## 2016-01-22 IMAGING — CR DG KNEE AP/LAT W/ SUNRISE*R*
1 series · 1 of 1 positions shown · non-contrast
Comparison: None

CLINICAL DATA: Right knee pain for 1 year question arthritis

EXAM:
DG KNEE - 3 VIEWS RIGHT

[view not recorded]
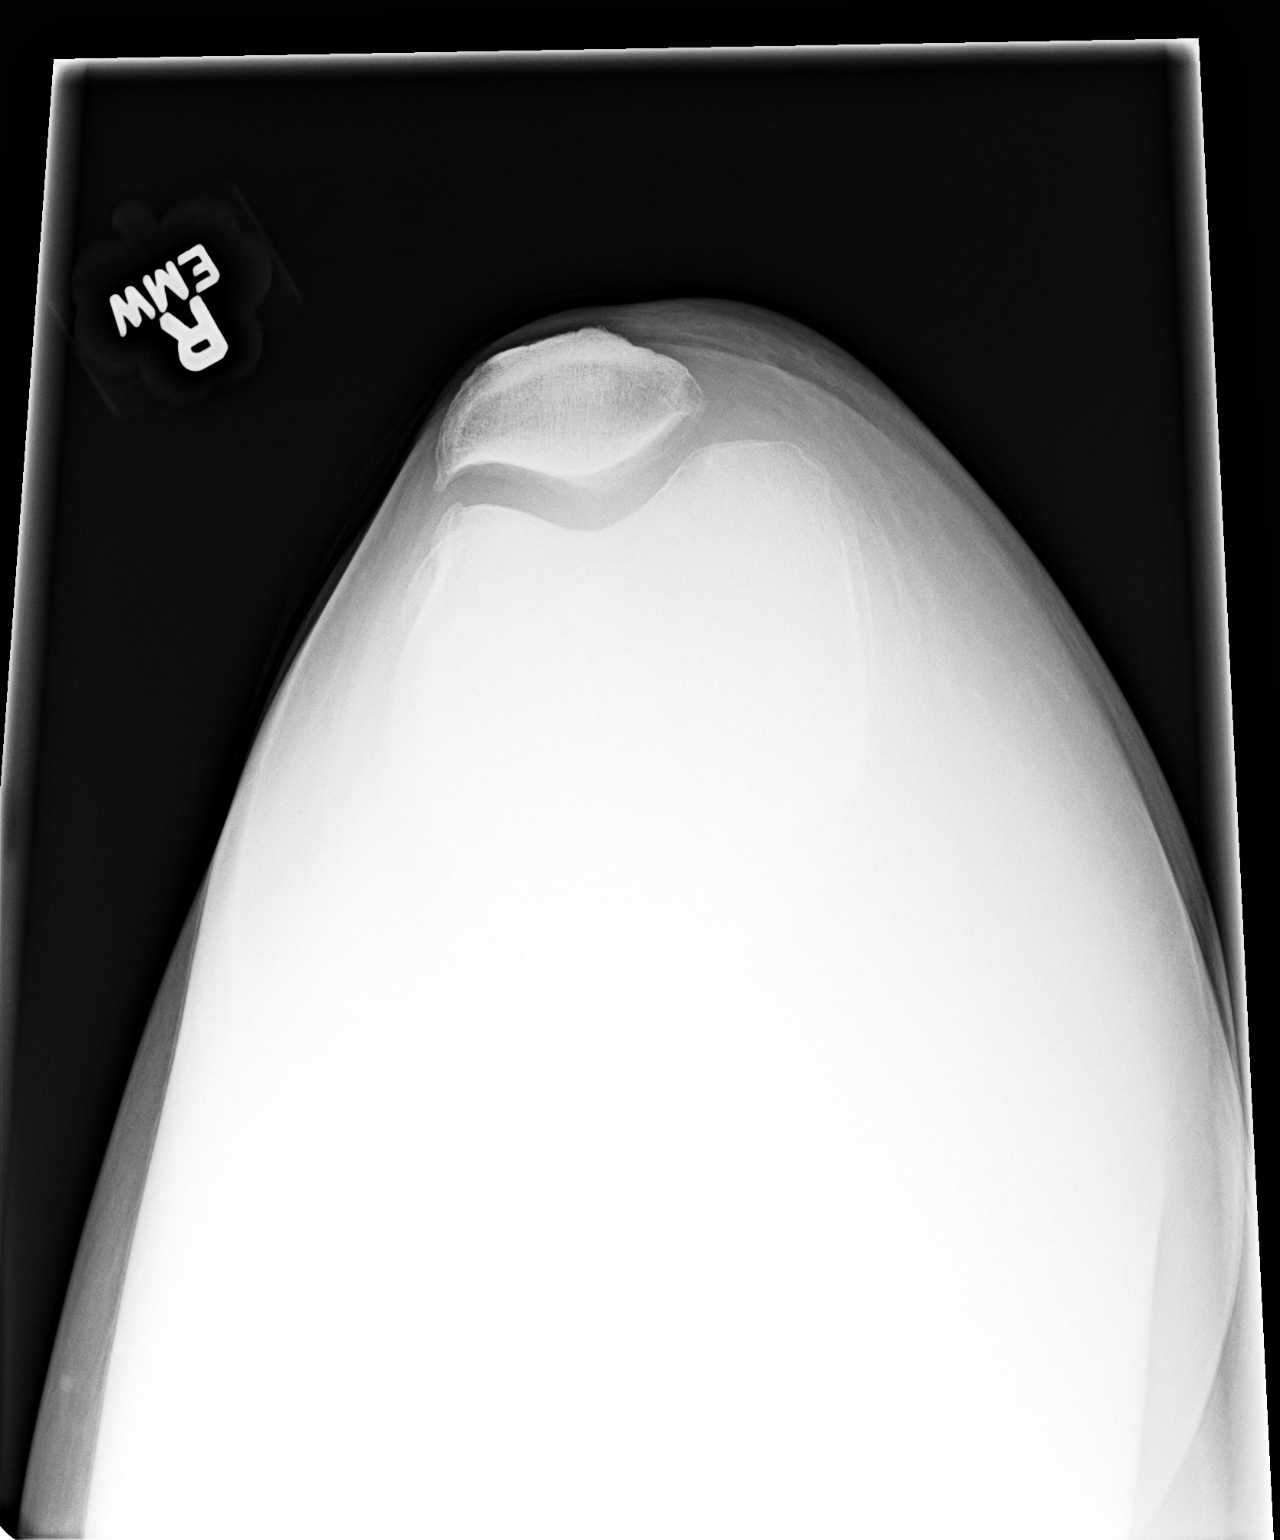

[1 of 1 positions shown; findings below may reference images not displayed]

FINDINGS: Osseous mineralization normal for technique.

Mild joint space narrowing.

Spur formation at lateral compartment.

No acute fracture, dislocation or bone destruction.

Small knee joint effusion.
IMPRESSION: Degenerative changes right knee, greatest at lateral compartment.

## 2016-06-23 ENCOUNTER — Other Ambulatory Visit: Payer: Self-pay | Admitting: Family

## 2016-06-23 NOTE — Telephone Encounter (Signed)
Medication filled to pharmacy as requested.   

## 2016-07-18 ENCOUNTER — Ambulatory Visit: Payer: BLUE CROSS/BLUE SHIELD | Admitting: Family

## 2016-07-20 ENCOUNTER — Encounter: Payer: Self-pay | Admitting: Family

## 2016-07-20 ENCOUNTER — Ambulatory Visit (INDEPENDENT_AMBULATORY_CARE_PROVIDER_SITE_OTHER): Payer: BLUE CROSS/BLUE SHIELD | Admitting: Family

## 2016-07-20 VITALS — BP 160/86 | HR 86 | Temp 98.4°F | Resp 16 | Ht 69.0 in | Wt 316.0 lb

## 2016-07-20 DIAGNOSIS — M5432 Sciatica, left side: Secondary | ICD-10-CM | POA: Diagnosis not present

## 2016-07-20 DIAGNOSIS — I10 Essential (primary) hypertension: Secondary | ICD-10-CM

## 2016-07-20 MED ORDER — MELOXICAM 7.5 MG PO TABS: 7.5000 mg | ORAL_TABLET | Freq: Every day | ORAL | 0 refills | Status: DC

## 2016-07-20 MED ORDER — HYDROCHLOROTHIAZIDE 25 MG PO TABS: 25.0000 mg | ORAL_TABLET | Freq: Every day | ORAL | 1 refills | Status: DC

## 2016-07-20 MED ORDER — AMLODIPINE BESYLATE 10 MG PO TABS: 10.0000 mg | ORAL_TABLET | Freq: Every day | ORAL | 1 refills | Status: DC

## 2016-07-20 NOTE — Patient Instructions (Addendum)
Begin meloxicam for low back pain. Call if new/worsening symptoms or if pain is not improved in 2 weeks.  Increase amlodipine from 5mg  to 10mg .

## 2016-07-20 NOTE — Progress Notes (Signed)
Pre visit review using our clinic review tool, if applicable. No additional management support is needed unless otherwise documented below in the visit note. 

## 2016-07-20 NOTE — Assessment & Plan Note (Signed)
Uncontrolled. Will increase amlodipine from 5mg  to 10mg . Continue toprol and hctz.  Follow up in 1 month.

## 2016-07-20 NOTE — Progress Notes (Signed)
Subjective:    Patient ID: Amy Sutton, female    DOB: August 15, 1981, 35 y.o.   MRN: [id number]  HPI  Amy Sutton is a 35 yr old female who presents today for follow up.  1) HTN- reports good med compliance on amlodipine and toprol xl.  Denies CP/SOB or swelling BP Readings from Last 3 Encounters:  07/20/16 (!) 152/108  01/18/16 130/76  10/30/15 138/82   2) Back pain- reports 6 weeks of left low back pain. Pain is dull/achey and is rated a 7/10. Reports that pain is located on the left side of her back. Reports that pain is constant. Worse with walking.  Reports intermittent radiation of the pain down the left leg/buttock.   Review of Systems See HPI  Past Medical History:  Diagnosis Date  . Hypertension      Social History   Social History  . Marital status: Single    Spouse name: N/A  . Number of children: 1  . Years of education: N/A   Occupational History  .  [employer]   Social History Main Topics  . Smoking status: Never Smoker  . Smokeless tobacco: Never Used  . Alcohol use 1.5 oz/week    3 drink(s) per week  . Drug use: Unknown  . Sexual activity: Not on file   Other Topics Concern  . 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  Family History  Problem Relation Age of Onset  . Arthritis Mother   . Hypertension Mother   . Diabetes Mother   . Hyperlipidemia Mother   . Hypertension Father   . Diabetes Father   . Hyperlipidemia Father   . Cancer Maternal Uncle     prostate  . Cancer Paternal Aunt     No Known Allergies  Current Outpatient Prescriptions on File Prior to Visit  Medication Sig Dispense Refill  . albuterol (PROVENTIL HFA;VENTOLIN HFA) 108 (90 Base) MCG/ACT inhaler Inhale 2 puffs into the lungs every 6 (six) hours as needed for  wheezing or shortness of breath. 1 Inhaler 0  . amLODipine (NORVASC) 5 MG tablet TAKE ONE TABLET BY MOUTH ONCE DAILY 90 tablet 1  . etonogestrel (NEXPLANON) 68 MG IMPL implant Inject 1 each into the skin once.    . hydrochlorothiazide (HYDRODIURIL) 25 MG tablet TAKE ONE TABLET BY MOUTH ONCE DAILY 90 tablet 1  . metoprolol succinate (TOPROL-XL) 50 MG 24 hr tablet TAKE ONE TABLET BY MOUTH ONCE DAILY (TAKE  WITH  OR  IMMEDIATELY  FOLLOWING  A  MEAL) 90 tablet 1   No current facility-administered medications on file prior to visit.     BP (!) 152/108 (BP Location: Right Arm, Patient Position: Sitting, Cuff Size: Large)   Pulse 86   Temp 98.4 F (36.9 C) (Oral)   Resp 16   Ht 5\' 9"  (1.753 m)   Wt (!) 316 lb (143.3 kg)   SpO2 100% Comment: RA  BMI 46.67 kg/m       Objective:   Physical Exam  Constitutional: She is oriented to person, place, and time. She appears well-developed and well-nourished.  Cardiovascular: Normal rate, regular rhythm and normal heart sounds.  No murmur heard. Pulmonary/Chest: Effort normal and breath sounds normal. No respiratory distress. She has no wheezes.  Musculoskeletal: She exhibits no edema.       Cervical back: She exhibits no tenderness.       Thoracic back: She exhibits no tenderness.       Lumbar back: She exhibits no tenderness.  Neurological: She is alert and oriented to person, place, and time.  Skin: Skin is warm and dry.  Psychiatric: She has a normal mood and affect. Her behavior is normal. Judgment and thought content normal.          Assessment & Plan:  Sciatica- new. We discussed PT referral but she does not have time for that. Will give trial of meloxicam.

## 2016-08-02 ENCOUNTER — Telehealth: Payer: Self-pay | Admitting: *Deleted

## 2016-08-02 MED ORDER — METOPROLOL SUCCINATE ER 50 MG PO TB24: ORAL_TABLET | ORAL | 1 refills | Status: DC

## 2016-08-02 MED ORDER — HYDROCHLOROTHIAZIDE 25 MG PO TABS: 25.0000 mg | ORAL_TABLET | Freq: Every day | ORAL | 1 refills | Status: DC

## 2016-08-02 MED ORDER — AMLODIPINE BESYLATE 10 MG PO TABS: 10.0000 mg | ORAL_TABLET | Freq: Every day | ORAL | 1 refills | Status: DC

## 2016-08-02 MED ORDER — ALBUTEROL SULFATE HFA 108 (90 BASE) MCG/ACT IN AERS: 2.0000 | INHALATION_SPRAY | Freq: Four times a day (QID) | RESPIRATORY_TRACT | 1 refills | Status: DC | PRN

## 2016-08-02 NOTE — Telephone Encounter (Signed)
Received faxed from [facility] for: Proair, HCTZ 25mg , metoprolol 50mg . Verified with pt that she is wanting to use mailorder. Rxs sent.

## 2016-08-03 MED ORDER — ALBUTEROL SULFATE HFA 108 (90 BASE) MCG/ACT IN AERS: 2.0000 | INHALATION_SPRAY | Freq: Four times a day (QID) | RESPIRATORY_TRACT | 1 refills | Status: DC | PRN

## 2016-08-03 NOTE — Addendum Note (Signed)
Addended by: Mervin KungFERGERSON, Moncerrat Burnstein A on: 08/03/2016 05:45 PM   Modules accepted: Orders

## 2016-08-08 ENCOUNTER — Telehealth: Payer: Self-pay | Admitting: *Deleted

## 2016-08-08 MED ORDER — MELOXICAM 7.5 MG PO TABS: 7.5000 mg | ORAL_TABLET | Freq: Every day | ORAL | 0 refills | Status: DC

## 2016-08-08 NOTE — Telephone Encounter (Signed)
Received fax from [facility] requesting 90 day supply of meloxicam. Please advise?

## 2016-08-08 NOTE — Telephone Encounter (Signed)
Refill sent.

## 2016-08-08 NOTE — Telephone Encounter (Signed)
Ok to send

## 2016-08-17 ENCOUNTER — Ambulatory Visit (INDEPENDENT_AMBULATORY_CARE_PROVIDER_SITE_OTHER): Payer: BLUE CROSS/BLUE SHIELD | Admitting: Family

## 2016-08-17 ENCOUNTER — Encounter: Payer: Self-pay | Admitting: Family

## 2016-08-17 VITALS — BP 152/73 | HR 91 | Temp 98.4°F | Resp 18 | Ht 69.0 in | Wt 321.0 lb

## 2016-08-17 DIAGNOSIS — M545 Low back pain, unspecified: Secondary | ICD-10-CM

## 2016-08-17 DIAGNOSIS — I10 Essential (primary) hypertension: Secondary | ICD-10-CM | POA: Diagnosis not present

## 2016-08-17 DIAGNOSIS — Z23 Encounter for immunization: Secondary | ICD-10-CM

## 2016-08-17 MED ORDER — METOPROLOL SUCCINATE ER 100 MG PO TB24: 100.0000 mg | ORAL_TABLET | Freq: Every day | ORAL | 2 refills | Status: DC

## 2016-08-17 MED ORDER — METHYLPREDNISOLONE 4 MG PO TBPK: ORAL_TABLET | ORAL | 0 refills | Status: DC

## 2016-08-17 NOTE — Progress Notes (Signed)
Pre visit review using our clinic review tool, if applicable. No additional management support is needed unless otherwise documented below in the visit note. 

## 2016-08-17 NOTE — Patient Instructions (Addendum)
Increase toprol xl to 100mg  once daily.   Follow up in 2 weeks for nurse visit BP recheck.  Begin medrol dose pak for your low back pain. Work on weight loss. You will be contacted about your referral to physical therapy.

## 2016-08-17 NOTE — Progress Notes (Signed)
Subjective:    Patient ID: Amy Sutton, female    DOB: 1981-04-27, 35 y.o.   MRN: [id number]  HPI  Amy Sutton is a 35 yr old female who presents today for follow up:  1) HTN- reports good compliance with BP medications. BP Readings from Last 3 Encounters:  08/17/16 (!) 152/73  07/20/16 (!) 160/86  01/18/16 130/76   2) Back Pain- reports that meloxicam is not helping. Pain is located in the left lower back and radiates down the left buttock, has associated "tingling"    Review of Systems Past Medical History:  Diagnosis Date  . Hypertension      Social History   Social History  . Marital status: Single    Spouse name: N/A  . Number of children: 1  . Years of education: N/A   Occupational History  .  [employer]   Social History Main Topics  . Smoking status: Never Smoker  . Smokeless tobacco: Never Used  . Alcohol use 1.5 oz/week    3 drink(s) per week  . Drug use: Unknown  . Sexual activity: Not on file   Other Topics Concern  . 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  Family History  Problem Relation Age of Onset  . Arthritis Mother   . Hypertension Mother   . Diabetes Mother   . Hyperlipidemia Mother   . Hypertension Father   . Diabetes Father   . Hyperlipidemia Father   . Cancer Maternal Uncle     prostate  . Cancer Paternal Aunt     No Known Allergies  Current Outpatient Prescriptions on File Prior to Visit  Medication Sig Dispense Refill  . albuterol (PROAIR HFA) 108 (90 Base) MCG/ACT inhaler Inhale 2 puffs into the lungs every 6 (six) hours as needed for wheezing or shortness of breath. 3 Inhaler 1  . amLODipine (NORVASC) 10 MG tablet Take 1 tablet (10 mg total) by mouth daily. 90 tablet 1  . etonogestrel (NEXPLANON) 68 MG IMPL  implant Inject 1 each into the skin once.    . hydrochlorothiazide (HYDRODIURIL) 25 MG tablet Take 1 tablet (25 mg total) by mouth daily. 90 tablet 1  . meloxicam (MOBIC) 7.5 MG tablet Take 1 tablet (7.5 mg total) by mouth daily. 90 tablet 0  . metoprolol succinate (TOPROL-XL) 50 MG 24 hr tablet TAKE ONE TABLET BY MOUTH ONCE DAILY (TAKE  WITH  OR  IMMEDIATELY  FOLLOWING  A  MEAL) 90 tablet 1   No current facility-administered medications on file prior to visit.     BP (!) 152/73 (BP Location: Right Arm, Cuff Size: Large)   Pulse 91   Temp 98.4 F (36.9 C) (Oral)   Resp 18   Ht 5\' 9"  (1.753 m)   Wt (!) 321 lb (145.6 kg)   SpO2 98% Comment: room air  BMI 47.40 kg/m       Objective:   Physical Exam  Constitutional: She appears well-developed and well-nourished.  Cardiovascular: Normal rate, regular rhythm and normal heart sounds.   No murmur heard. Pulmonary/Chest: Effort normal and breath sounds normal. No respiratory distress. She has no wheezes.  Musculoskeletal:  Lumbar back: She exhibits tenderness.  Neurological:  Bilateral LE strength is 5/5 + straight leg raise left  Psychiatric: She has a normal mood and affect. Her behavior is normal. Judgment and thought content normal.          Assessment & Plan:  Low back pain with radiulopathy- uncontrolled. Will rx with medrol dose pak and refer to PT.  HTN-  Uncontrolled. Increase toprol xl to 100mg  once daily.   Follow up in 2 weeks for nurse visit BP recheck

## 2016-08-31 ENCOUNTER — Ambulatory Visit (INDEPENDENT_AMBULATORY_CARE_PROVIDER_SITE_OTHER): Payer: BLUE CROSS/BLUE SHIELD | Admitting: Family Medicine

## 2016-08-31 VITALS — BP 149/80 | HR 85

## 2016-08-31 DIAGNOSIS — I10 Essential (primary) hypertension: Secondary | ICD-10-CM

## 2016-08-31 NOTE — Progress Notes (Signed)
Pre visit review using our clinic tool,if applicable. No additional management support is needed unless otherwise documented below in the visit note.   Patient in for BP check per order from Macario CarlsMelissa Osullivan, NP  BP=149/80 P=85  Per Dr. Patsy Lageropland patient to follow up with provider in 3-4 weeks. Appointment scheduled.

## 2016-09-21 ENCOUNTER — Ambulatory Visit: Payer: BLUE CROSS/BLUE SHIELD | Admitting: Family

## 2016-10-05 ENCOUNTER — Ambulatory Visit (INDEPENDENT_AMBULATORY_CARE_PROVIDER_SITE_OTHER): Payer: BLUE CROSS/BLUE SHIELD | Admitting: Family

## 2016-10-05 ENCOUNTER — Encounter: Payer: Self-pay | Admitting: Family

## 2016-10-05 VITALS — BP 135/79 | HR 88 | Temp 98.9°F | Ht 69.0 in | Wt 318.2 lb

## 2016-10-05 DIAGNOSIS — I10 Essential (primary) hypertension: Secondary | ICD-10-CM | POA: Diagnosis not present

## 2016-10-05 DIAGNOSIS — Z3040 Encounter for surveillance of contraceptives, unspecified: Secondary | ICD-10-CM | POA: Diagnosis not present

## 2016-10-05 DIAGNOSIS — J45909 Unspecified asthma, uncomplicated: Secondary | ICD-10-CM

## 2016-10-05 LAB — BASIC METABOLIC PANEL
BUN: 10 mg/dL (ref 6–23)
CALCIUM: 9.6 mg/dL (ref 8.4–10.5)
CO2: 28 mEq/L (ref 19–32)
CREATININE: 0.72 mg/dL (ref 0.40–1.20)
Chloride: 105 mEq/L (ref 96–112)
GFR: 118.87 mL/min (ref >60.00)
Glucose, Bld: 118 mg/dL — ABNORMAL HIGH (ref 70–99)
POTASSIUM: 3.5 meq/L (ref 3.5–5.1)
Sodium: 139 mEq/L (ref 135–145)

## 2016-10-05 MED ORDER — AMLODIPINE BESYLATE 10 MG PO TABS: 10.0000 mg | ORAL_TABLET | Freq: Every day | ORAL | 1 refills | Status: DC

## 2016-10-05 MED ORDER — METOPROLOL SUCCINATE ER 100 MG PO TB24
100.0000 mg | ORAL_TABLET | Freq: Every day | ORAL | 1 refills | Status: DC
Start: 2016-10-05 — End: 2017-04-10

## 2016-10-05 MED ORDER — HYDROCHLOROTHIAZIDE 25 MG PO TABS: 25.0000 mg | ORAL_TABLET | Freq: Every day | ORAL | 1 refills | Status: DC

## 2016-10-05 NOTE — Patient Instructions (Signed)
Please complete lab work prior to leaving. You will be contacted about your referral to GYN.  

## 2016-10-05 NOTE — Assessment & Plan Note (Signed)
Stable, continue albuterol prn  °

## 2016-10-05 NOTE — Progress Notes (Signed)
Subjective:    Patient ID: Amy Sutton, female    DOB: 24-Aug-1981, 36 y.o.   MRN: [id number]  HPI  Amy Sutton is a 36 yr old female who presents today for follow up of her hypertension. She reports good compliance with her medications.   BP Readings from Last 3 Encounters:  10/05/16 135/79  08/31/16 (!) 149/80  08/17/16 (!) 152/73    Asthma- Reports no recent asthma symptoms.  Contraceptive management- requests referral to GYN for nexplanon change.   Review of Systems  Respiratory: Negative for cough and shortness of breath.   Cardiovascular: Negative for leg swelling.       Past Medical History:  Diagnosis Date  . Hypertension      Social History   Social History  . Marital status: Single    Spouse name: N/A  . Number of children: 1  . Years of education: N/A   Occupational History  .  [employer]   Social History Main Topics  . Smoking status: Never Smoker  . Smokeless tobacco: Never Used  . Alcohol use 1.5 oz/week    3 drink(s) per week  . Drug use: Unknown  . Sexual activity: Not on file   Other Topics Concern  . 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  Family History  Problem Relation Age of Onset  . Arthritis Mother   . Hypertension Mother   . Diabetes Mother   . Hyperlipidemia Mother   . Hypertension Father   . Diabetes Father   . Hyperlipidemia Father   . Cancer Maternal Uncle     prostate  . Cancer Paternal Aunt     No Known Allergies  Current Outpatient Prescriptions on File Prior to Visit  Medication Sig Dispense Refill  . albuterol (PROAIR HFA) 108 (90 Base) MCG/ACT inhaler Inhale 2 puffs into the lungs every 6 (six) hours as needed for wheezing or shortness of breath. 3 Inhaler 1  . amLODipine (NORVASC) 10 MG tablet Take  1 tablet (10 mg total) by mouth daily. 90 tablet 1  . etonogestrel (NEXPLANON) 68 MG IMPL implant Inject 1 each into the skin once.    . hydrochlorothiazide (HYDRODIURIL) 25 MG tablet Take 1 tablet (25 mg total) by mouth daily. 90 tablet 1  . meloxicam (MOBIC) 7.5 MG tablet Take 1 tablet (7.5 mg total) by mouth daily. 90 tablet 0  . metoprolol succinate (TOPROL-XL) 100 MG 24 hr tablet Take 1 tablet (100 mg total) by mouth daily. Take with or immediately following a meal. 30 tablet 2  . olopatadine (PATANOL) 0.1 % ophthalmic solution Place 1 drop into both eyes 2 (two) times daily.     No current facility-administered medications on file prior to visit.     BP 135/79 (BP Location: Right Arm, Cuff Size: Large)   Pulse 88   Temp 98.9 F (37.2 C) (Oral)   Ht 5\' 9"  (1.753 m)   Wt (!) 318 lb 3.2 oz (144.3 kg)   SpO2 100%   BMI 46.99 kg/m    Objective:   Physical Exam  Constitutional: She is oriented to person, place, and time. She appears well-developed and well-nourished.  Cardiovascular:  Normal rate, regular rhythm and normal heart sounds.   No murmur heard. Pulmonary/Chest: Effort normal and breath sounds normal. No respiratory distress. She has no wheezes.  Neurological: She is alert and oriented to person, place, and time.  Psychiatric: She has a normal mood and affect. Her behavior is normal. Judgment and thought content normal.          Assessment & Plan:  Contraceptive management- refer to GYN.

## 2016-10-05 NOTE — Progress Notes (Signed)
Pre visit review using our clinic review tool, if applicable. No additional management support is needed unless otherwise documented below in the visit note. 

## 2016-10-05 NOTE — Assessment & Plan Note (Signed)
Stable/improved on current medications. Obtain follow up bmet to assess electrolytes and renal funciton.

## 2016-10-21 ENCOUNTER — Telehealth: Payer: Self-pay

## 2016-10-28 ENCOUNTER — Other Ambulatory Visit: Payer: Self-pay | Admitting: Family

## 2016-11-02 ENCOUNTER — Telehealth: Payer: Self-pay | Admitting: Family

## 2016-11-02 NOTE — Telephone Encounter (Signed)
Self.    Refill request for hydrochlorothiazide   Pharmacy: [id number] - [place], [place] - [address]

## 2016-11-02 NOTE — Telephone Encounter (Signed)
Tried to contact pt but voice mail was full. Sent my chart message

## 2016-11-29 NOTE — Telephone Encounter (Signed)
Sent refill for Metoprolol

## 2016-12-20 ENCOUNTER — Ambulatory Visit (INDEPENDENT_AMBULATORY_CARE_PROVIDER_SITE_OTHER): Payer: BLUE CROSS/BLUE SHIELD | Admitting: Family

## 2016-12-20 ENCOUNTER — Encounter: Payer: Self-pay | Admitting: Family

## 2016-12-20 VITALS — BP 140/84 | HR 74 | Temp 98.4°F | Resp 18 | Ht 69.0 in | Wt 324.0 lb

## 2016-12-20 DIAGNOSIS — H9201 Otalgia, right ear: Secondary | ICD-10-CM

## 2016-12-20 NOTE — Progress Notes (Signed)
Subjective:    Patient ID: Amy Sutton, female    DOB: 02-07-1981, 36 y.o.   MRN: [id number]  HPI  [name] is a 36 yr old female who presents today with chief complaint or right sided ear pain. Ear pain has been present x 1 week.     Review of Systems See HPI  Past Medical History:  Diagnosis Date  . Hypertension      Social History   Social History  . Marital status: Single    Spouse name: N/A  . Number of children: 1  . Years of education: N/A   Occupational History  .  [employer]   Social History Main Topics  . Smoking status: Never Smoker  . Smokeless tobacco: Never Used  . Alcohol use 1.5 oz/week    3 drink(s) per week  . Drug use: Unknown  . Sexual activity: Not on file   Other Topics Concern  . 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  Family History  Problem Relation Age of Onset  . Arthritis Mother   . Hypertension Mother   . Diabetes Mother   . Hyperlipidemia Mother   . Hypertension Father   . Diabetes Father   . Hyperlipidemia Father   . Cancer Maternal Uncle     prostate  . Cancer Paternal Aunt     No Known Allergies  Current Outpatient Prescriptions on File Prior to Visit  Medication Sig Dispense Refill  . albuterol (PROAIR HFA) 108 (90 Base) MCG/ACT inhaler Inhale 2 puffs into the lungs every 6 (six) hours as needed for wheezing or shortness of breath. 3 Inhaler 1  . amLODipine (NORVASC) 10 MG tablet Take 1 tablet (10 mg total) by mouth daily. 90 tablet 1  . etonogestrel (NEXPLANON) 68 MG IMPL implant Inject 1 each into the skin once.    . hydrochlorothiazide (HYDRODIURIL) 25 MG tablet Take 1 tablet (25 mg total) by mouth daily. 90 tablet 1  . meloxicam (MOBIC) 7.5 MG tablet TAKE 1 TABLET DAILY 90 tablet 0  . metoprolol  succinate (TOPROL-XL) 100 MG 24 hr tablet Take 1 tablet (100 mg total) by mouth daily. Take with or immediately following a meal. 90 tablet 1  . olopatadine (PATANOL) 0.1 % ophthalmic solution Place 1 drop into both eyes 2 (two) times daily.     No current facility-administered medications on file prior to visit.     BP 140/84 (BP Location: Right Arm) Comment (Cuff Size): thigh cuff  Pulse 74   Temp 98.4 F (36.9 C) (Oral)   Resp 18   Ht  (1.753 m)   Wt (!) 324 lb (147 kg)   SpO2 100% Comment: room air  BMI 47.85 kg/m       Objective:   Physical Exam  Constitutional: She is oriented to person, place, and time. She appears well-developed and well-nourished. No distress.  HENT:  Right Ear: Tympanic membrane and ear canal normal.  Left Ear: Tympanic membrane and ear canal normal.  Mouth/Throat: No oropharyngeal exudate, posterior oropharyngeal edema or posterior oropharyngeal erythema.  Cardiovascular: Normal rate and regular rhythm.   No murmur heJennye Boroughs/Chest: Effort normal and breath  sounds normal. No respiratory distress. She has no wheezes. She has no rales. She exhibits no tenderness.  Musculoskeletal: She exhibits no edema.  Neurological: She is alert and oriented to person, place, and time.  Psychiatric: She has a normal mood and affect. Her behavior is normal. Judgment and thought content normal.          Assessment & Plan:  Otalgia- normal ear exam. ? Eustachian tube dysfunction. Will rx with trial of zyrtec and nasal saline irrigation. Pt is advised to let me know if symptoms worsen or if symptoms fail to improve.

## 2016-12-20 NOTE — Progress Notes (Signed)
Pre visit review using our clinic review tool, if applicable. No additional management support is needed unless otherwise documented below in the visit note. 

## 2016-12-20 NOTE — Patient Instructions (Addendum)
Our referral coordinator will check on your GYN referral and you may also contact them at [phone] to schedule this appointment as well.  Begin claritin once daily. Also, please rinse your nostrils twice daily with nasal saline spray. For pain you may use tylenol or motrin. Please call if symptoms worsen, if you develop fever or if pain is not improved in 1 week.

## 2017-01-25 ENCOUNTER — Other Ambulatory Visit: Payer: Self-pay | Admitting: Family

## 2017-04-04 ENCOUNTER — Ambulatory Visit: Payer: BLUE CROSS/BLUE SHIELD | Admitting: Family

## 2017-04-10 ENCOUNTER — Ambulatory Visit (INDEPENDENT_AMBULATORY_CARE_PROVIDER_SITE_OTHER): Payer: BLUE CROSS/BLUE SHIELD | Admitting: Family

## 2017-04-10 VITALS — BP 141/82 | HR 93 | Temp 98.5°F | Resp 18 | Ht 69.0 in | Wt 318.8 lb

## 2017-04-10 DIAGNOSIS — M5442 Lumbago with sciatica, left side: Secondary | ICD-10-CM | POA: Diagnosis not present

## 2017-04-10 DIAGNOSIS — Z3046 Encounter for surveillance of implantable subdermal contraceptive: Secondary | ICD-10-CM

## 2017-04-10 DIAGNOSIS — G8929 Other chronic pain: Secondary | ICD-10-CM | POA: Diagnosis not present

## 2017-04-10 DIAGNOSIS — I10 Essential (primary) hypertension: Secondary | ICD-10-CM | POA: Diagnosis not present

## 2017-04-10 MED ORDER — METOPROLOL SUCCINATE ER 50 MG PO TB24: 50.0000 mg | ORAL_TABLET | Freq: Every day | ORAL | 1 refills | Status: DC

## 2017-04-10 MED ORDER — METOPROLOL SUCCINATE ER 100 MG PO TB24: 100.0000 mg | ORAL_TABLET | Freq: Every day | ORAL | 1 refills | Status: DC

## 2017-04-10 NOTE — Patient Instructions (Addendum)
You should be contacted about your referral to GYN and physical therapy. Please let me know if you have not heard back in 1 week.  Continue toprol xl 100mg . In addition please start toprol xl 50mg  once daily.

## 2017-04-10 NOTE — Progress Notes (Signed)
Subjective:    Patient ID: Amy Sutton, female    DOB: 1980-12-22, 36 y.o.   MRN: [id number]  HPI  Patient is a 36 year old female who presents today for follow-up.  Hypertension-current blood pressure medications include Toprol-XL 100 mg by mouth daily. She is also maintained on amlodipine 10 mg daily, hctz.   BP Readings from Last 3 Encounters:  04/10/17 (!) 141/82  12/20/16 140/84  10/05/16 135/79   Back Pain- Reports that she continues to have daily low back pain despite use of meloxicam. I initially treated her for this back in December and she was given a Medrol Dosepak. Reports that this helped briefly. She reports daily pain which radiates down the left buttocks. Denies bowel/bladder incontinence.    Review of Systems    see HPI  Past Medical History:  Diagnosis Date  . Hypertension      Social History   Social History  . Marital status: Single    Spouse name: N/A  . Number of children: 1  . Years of education: N/A   Occupational History  .  [employer]   Social History Main Topics  . Smoking status: Never Smoker  . Smokeless tobacco: Never Used  . Alcohol use 1.5 oz/week    3 drink(s) per week  . Drug use: Unknown  . Sexual activity: Not on file   Other Topics Concern  . 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  Family History  Problem Relation Age of Onset  . Arthritis Mother   . Hypertension Mother   . Diabetes Mother   . Hyperlipidemia Mother   . Hypertension Father   . Diabetes Father   . Hyperlipidemia Father   . Cancer Maternal Uncle        prostate  . Cancer Paternal Aunt     No Known Allergies  Current Outpatient Prescriptions on File Prior to Visit  Medication Sig Dispense Refill  . albuterol (PROAIR HFA) 108 (90 Base)  MCG/ACT inhaler Inhale 2 puffs into the lungs every 6 (six) hours as needed for wheezing or shortness of breath. 3 Inhaler 1  . amLODipine (NORVASC) 10 MG tablet Take 1 tablet (10 mg total) by mouth daily. 90 tablet 1  . etonogestrel (NEXPLANON) 68 MG IMPL implant Inject 1 each into the skin once.    . hydrochlorothiazide (HYDRODIURIL) 25 MG tablet Take 1 tablet (25 mg total) by mouth daily. 90 tablet 1  . metoprolol succinate (TOPROL-XL) 100 MG 24 hr tablet Take 1 tablet (100 mg total) by mouth daily. Take with or immediately following a meal. 90 tablet 1  . olopatadine (PATANOL) 0.1 % ophthalmic solution Place 1 drop into both eyes 2 (two) times daily.    . meloxicam (MOBIC) 7.5 MG tablet TAKE 1 TABLET DAILY (Patient not taking: Reported on 04/10/2017) 90 tablet 0   No current facility-administered medications on file prior to visit.     BP (!) 141/82 (BP Location: Right Arm) Comment (Cuff Size): thigh  Pulse 93   Temp 98.5 F (36.9 C) (Oral)   Resp 18   Ht 5\' 9"  (1.753 m)   Wt (!) 318 lb 12.8 oz (144.6 kg)  SpO2 98%   BMI 47.08 kg/m    Objective:   Physical Exam  Constitutional: She is oriented to person, place, and time. She appears well-developed and well-nourished.  HENT:  Head: Normocephalic and atraumatic.  Cardiovascular: Normal rate, regular rhythm and normal heart sounds.   No murmur heard. Pulmonary/Chest: Effort normal and breath sounds normal. No respiratory distress. She has no wheezes.  Musculoskeletal: She exhibits no edema.  Lymphadenopathy:    She has no cervical adenopathy.  Neurological: She is alert and oriented to person, place, and time.  Reflex Scores:      Patellar reflexes are 1+ on the right side and 1+ on the left side. Skin: Skin is warm and dry.  Psychiatric: She has a normal mood and affect. Her behavior is normal. Judgment and thought content normal.          Assessment & Plan:  HTN- uncontrolled. Increase toprol xl to 150mg  once daily.  Continue amlodipine and hctz.  Low back pain with sciatica- will refer to PT, discussed importance of ongoing exercise and weight loss efforts.   Contaceptive management- will refer to GYN at pt request for nexplanon exchange.

## 2017-04-18 ENCOUNTER — Telehealth: Payer: Self-pay | Admitting: Physical Therapy

## 2017-04-18 NOTE — Telephone Encounter (Signed)
04/13/17 & 04/18/17 left message to schedule PT eval, however has a $6000 deductible not met

## 2017-04-25 ENCOUNTER — Other Ambulatory Visit: Payer: Self-pay | Admitting: Family

## 2017-04-27 ENCOUNTER — Encounter: Payer: Self-pay | Admitting: Physical Therapy

## 2017-04-27 ENCOUNTER — Ambulatory Visit: Payer: BLUE CROSS/BLUE SHIELD | Attending: Family | Admitting: Physical Therapy

## 2017-04-27 DIAGNOSIS — G8929 Other chronic pain: Secondary | ICD-10-CM

## 2017-04-27 DIAGNOSIS — M5442 Lumbago with sciatica, left side: Secondary | ICD-10-CM | POA: Insufficient documentation

## 2017-04-27 DIAGNOSIS — M62830 Muscle spasm of back: Secondary | ICD-10-CM | POA: Insufficient documentation

## 2017-04-27 NOTE — Therapy (Signed)
[place], [place], [address] Phone: [phone]   Fax:  [phone]  Physical Therapy Evaluation  Patient Details  Name: Amy Sutton MRN: [id number] Date of Birth: 08/27/1981 Referring Provider: [name]  Encounter Date: 04/27/2017      PT End of Session - 04/27/17 0829    Visit Number 1   Date for PT Re-Evaluation 06/27/17   PT Start Time 0755   PT Stop Time 0845   PT Time Calculation (min) 50 min   Activity Tolerance Patient tolerated treatment well   Behavior During Therapy [facility] for tasks assessed/performed      Past Medical History:  Diagnosis Date  . Hypertension     History reviewed. No pertinent surgical history.  There were no vitals filed for this visit.       Subjective Assessment - 04/27/17 0800    Subjective Patient reports that she has been having pain for a number of years, reports had PT a few years ago and it helped.  X-rays showed some scoliosis and some DDD.  She reports some left sciatic pain.   Limitations Walking;House hold activities   Patient Stated Goals have less pain   Currently in Pain? Yes   Pain Score 5    Pain Location Back   Pain Orientation Lower;Left   Pain Descriptors / Indicators Aching;Nagging   Pain Type Chronic pain   Pain Onset More than a month ago   Pain Frequency Constant   Aggravating Factors  walking, standing, sitting, pain up to 8/10   Pain Relieving Factors changing position helps, medication pain at best a 2/10   Effect of Pain on Daily Activities just hurts, have to change position often            [facility] PT Assessment - 04/27/17 0001      Assessment   Medical Diagnosis LBP   Referring Provider [name]   Onset Date/Surgical Date 03/27/17   Prior Therapy a few years ago     Precautions   Precautions None     Balance Screen   Has the patient fallen in the past 6 months No   Has the patient  had a decrease in activity level because of a fear of falling?  No   Is the patient reluctant to leave their home because of a fear of falling?  No     Home Environment   Additional Comments does housework     Prior Function   Level of Independence Independent   Vocation Full time employment   Vocation Requirements mostly sitting   Leisure no exercise     Posture/Postural Control   Posture Comments fwd head, rounded shoulders     ROM / Strength   AROM / PROM / Strength AROM;Strength     AROM   Overall AROM Comments Lumbar ROM decreased 25%     Strength   Overall Strength Comments WNL's     Flexibility   Soft Tissue Assessment /Muscle Length --  very tight quad, mild tight HS, ITB and piriformis     Palpation   Palpation comment she is very tight in the lumbar parapsinals and the buttock mms, mild tenderness            Objective measurements completed on examination: See above findings.                  PT Education - 04/27/17 4081  Education provided Yes   Education Details HEP for Wms flexion and quad and piriformis stretches   Person(s) Educated Patient   Methods Explanation;Demonstration;Handout   Comprehension Verbalized understanding          PT Short Term Goals - 04/27/17 0830      PT SHORT TERM GOAL #1   Title independent with initial HEP   Time 2   Period Weeks   Status New           PT Long Term Goals - 04/27/17 3354      PT LONG TERM GOAL #1   Title understand proper posture nad body mechanics   Time 8   Period Weeks   Status New     PT LONG TERM GOAL #2   Title decrease pain 50%   Time 8   Period Weeks   Status New     PT LONG TERM GOAL #3   Title be safe with a return to a gym setting   Time 8   Period Weeks   Status New     PT LONG TERM GOAL #4   Title tolerate walking 30 minutes   Time 8   Period Weeks   Status New                Plan - 04/27/17 0830    Clinical Impression Statement  Patient reports that she has had back pain for a number of years, reports that PT in the past helped.  She had recent x-rays that showed scoliosis and DDD.  Her ROM and strength were good, she had tightness of the quads, ITB and piriformis.  Had some relief with manual pelvic traction using a sheet so I also showed her how to do this at home   Clinical Presentation Stable   Clinical Decision Making Low   Rehab Potential Good   PT Frequency 2x / week   PT Duration 8 weeks   PT Next Visit Plan Has a high deductible that has not been met, so I am not sure that she will come in often, start gym activities to strengthen    Consulted and Agree with Plan of Care Patient      Patient will benefit from skilled therapeutic intervention in order to improve the following deficits and impairments:  Decreased activity tolerance, Decreased mobility, Postural dysfunction, Improper body mechanics, Impaired flexibility, Pain, Increased muscle spasms, Decreased range of motion  Visit Diagnosis: Chronic left-sided low back pain with left-sided sciatica - Plan: PT plan of care cert/re-cert  Muscle spasm of back - Plan: PT plan of care cert/re-cert     Problem List Patient Active Problem List   Diagnosis Date Noted  . Asthma 10/05/2016  . Hyperpigmentation 02/09/2015  . Immunization counseling 10/30/2013  . Right knee pain 10/14/2013  . 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 Obesity, morbid ([place]) 06/24/2013  . Hidradenitis 10/08/2012  . Low back pain 10/08/2012  . Routine general medical examination at a health care facility 05/30/2012  . HTN (hypertension) 05/22/2012    Sumner Boast., PT 04/27/2017, 8:43 AM  [facility] [place], [place], [address] Phone: [phone]   Fax:  [phone]  Name: Amy Sutton MRN: [id number] Date of Birth: April 10, 1981

## 2017-05-10 ENCOUNTER — Ambulatory Visit: Payer: BLUE CROSS/BLUE SHIELD | Admitting: Family

## 2017-05-17 ENCOUNTER — Other Ambulatory Visit: Payer: Self-pay | Admitting: Family

## 2017-05-17 NOTE — Telephone Encounter (Signed)
HCTZ refill sent to pharmacy. Pt last seen 04/2017 and advised 1 month follow up. Pt is past due. Sent [employer].

## 2017-06-12 ENCOUNTER — Ambulatory Visit (INDEPENDENT_AMBULATORY_CARE_PROVIDER_SITE_OTHER): Payer: BLUE CROSS/BLUE SHIELD | Admitting: Family

## 2017-06-12 ENCOUNTER — Encounter: Payer: Self-pay | Admitting: Family

## 2017-06-12 DIAGNOSIS — I10 Essential (primary) hypertension: Secondary | ICD-10-CM | POA: Diagnosis not present

## 2017-06-12 DIAGNOSIS — Z23 Encounter for immunization: Secondary | ICD-10-CM

## 2017-06-12 DIAGNOSIS — J45909 Unspecified asthma, uncomplicated: Secondary | ICD-10-CM

## 2017-06-12 MED ORDER — ALBUTEROL SULFATE HFA 108 (90 BASE) MCG/ACT IN AERS: 2.0000 | INHALATION_SPRAY | Freq: Four times a day (QID) | RESPIRATORY_TRACT | 1 refills | Status: DC | PRN

## 2017-06-12 MED ORDER — AMLODIPINE BESYLATE 5 MG PO TABS: 10.0000 mg | ORAL_TABLET | Freq: Every day | ORAL | 1 refills | Status: DC

## 2017-06-12 NOTE — Patient Instructions (Signed)
Please change amlodipine from  tabs to two  tabs once daily.

## 2017-06-12 NOTE — Progress Notes (Signed)
Subjective:    Patient ID: Amy Sutton, female    DOB: 1981-05-26, 36 y.o.   MRN: [id number]  HPI   Amy Sutton is a 36 yr old female who presents today for follow up.  HTN- she is taking  +  of metoprolol, also on amlodipine . Has trouble swallowing the  pills.  BP Readings from Last 3 Encounters:  06/12/17 136/84  04/10/17 (!) 141/82  12/20/16 140/84   Asthma-reports occasional symptoms which are improved by use of albuterol.     Review of Systems    see HPI  Past Medical History:  Diagnosis Date  . Hypertension      Social History   Social History  . Marital status: Single    Spouse name: N/A  . Number of children: 1  . Years of education: N/A   Occupational History  .  [employer]   Social History Main Topics  . Smoking status: Never Smoker  . Smokeless tobacco: Never Used  . Alcohol use 1.5 oz/week    3 drink(s) per week  . Drug use: Unknown  . Sexual activity: Not on file   Other Topics Concern  . Not on file   Social History Narrative   Regular exercise:  Not currently   Caffeine Use:  Sometimes   4 yr old son   Consulting civil engineer at [employer].   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  Family History  Problem Relation Age of Onset  . Arthritis Mother   . Hypertension Mother   . Diabetes Mother   . Hyperlipidemia Mother   . Hypertension Father   . Diabetes Father   . Hyperlipidemia Father   . Cancer Maternal Uncle        prostate  . Cancer Paternal Aunt     No Known Allergies  Current Outpatient Prescriptions on File Prior to Visit  Medication Sig Dispense Refill  . albuterol (PROAIR HFA) 108 (90 Base) MCG/ACT inhaler Inhale 2 puffs into the lungs every 6 (six) hours as needed for wheezing or shortness of breath. 3 Inhaler 1  . amLODipine (NORVASC) 10 MG tablet Take 1 tablet (10 mg total) by mouth daily. 90 tablet 1  . etonogestrel (NEXPLANON) 68 MG IMPL implant  Inject 1 each into the skin once.    . hydrochlorothiazide (HYDRODIURIL) 25 MG tablet TAKE 1 TABLET (25 MG TOTAL) BY MOUTH DAILY. 90 tablet 1  . meloxicam (MOBIC) 7.5 MG tablet TAKE 1 TABLET DAILY 90 tablet 0  . metoprolol succinate (TOPROL XL) 50 MG 24 hr tablet Take 1 tablet (50 mg total) by mouth daily. Take with or immediately following a meal. 90 tablet 1  . metoprolol succinate (TOPROL-XL) 100 MG 24 hr tablet Take 1 tablet (100 mg total) by mouth daily. Take with or immediately following a meal. 90 tablet 1  . olopatadine (PATANOL) 0.1 % ophthalmic solution Place 1 drop into both eyes 2 (two) times daily.     No current facility-administered medications on file prior to visit.     BP 136/84 (BP Location: Right Arm) Comment (Cuff Size): thigh  Pulse 88   Temp 98.4 F (36.9 C) (Oral)   Resp 18   Ht  (1.753 m)   Wt (!) 321 lb 6.4 oz (145.8 kg)   SpO2 100%   BMI 47.46 kg/m    Objective:  Physical Exam  Constitutional: She is oriented to person, place, and time. She appears well-developed and well-nourished.  Cardiovascular: Normal rate, regular rhythm and normal heart sounds.   No murmur heard. Pulmonary/Chest: Effort normal and breath sounds normal. No respiratory distress. She has no wheezes.  Musculoskeletal: She exhibits no edema.  Neurological: She is alert and oriented to person, place, and time.  Psychiatric: She has a normal mood and affect. Her behavior is normal. Judgment and thought content normal.          Assessment & Plan:

## 2017-06-13 NOTE — Assessment & Plan Note (Addendum)
BP stable on current medications. Change amlodipine from  tabs to two 5 mg tabs for easier swallowing.

## 2017-06-13 NOTE — Assessment & Plan Note (Signed)
Stable with PRN albuterol.  Continue same.

## 2017-06-19 ENCOUNTER — Telehealth: Payer: Self-pay | Admitting: *Deleted

## 2017-06-19 MED ORDER — AMLODIPINE BESYLATE 10 MG PO TABS: 10.0000 mg | ORAL_TABLET | Freq: Every day | ORAL | 1 refills | Status: DC

## 2017-06-19 NOTE — Telephone Encounter (Signed)
Received fax from [facility] stating pt's insurance will not cover , 2 tablets daily and prefers  tablet once daily. Pt previously noted some difficulty with swallowing the  tablets. Pharmacist states that they are now getting med from a different manufacturer and tablets may be a bit smaller and different shape. Also recommends that pt could cut tablet in 1/2 and take each half separately. Notified pt. Sent Rx for  tablets once daily.

## 2017-06-23 ENCOUNTER — Emergency Department (HOSPITAL_BASED_OUTPATIENT_CLINIC_OR_DEPARTMENT_OTHER)
Admission: EM | Admit: 2017-06-23 | Discharge: 2017-06-24 | Disposition: A | Discharge status: Home / Self Care | Payer: BLUE CROSS/BLUE SHIELD | Attending: Emergency Medicine | Admitting: Emergency Medicine

## 2017-06-23 ENCOUNTER — Encounter (HOSPITAL_BASED_OUTPATIENT_CLINIC_OR_DEPARTMENT_OTHER): Payer: Self-pay | Admitting: *Deleted

## 2017-06-23 DIAGNOSIS — I10 Essential (primary) hypertension: Secondary | ICD-10-CM | POA: Diagnosis not present

## 2017-06-23 DIAGNOSIS — J45909 Unspecified asthma, uncomplicated: Secondary | ICD-10-CM | POA: Diagnosis not present

## 2017-06-23 DIAGNOSIS — K0889 Other specified disorders of teeth and supporting structures: Secondary | ICD-10-CM | POA: Insufficient documentation

## 2017-06-23 DIAGNOSIS — Z79899 Other long term (current) drug therapy: Secondary | ICD-10-CM | POA: Insufficient documentation

## 2017-06-23 MED ORDER — LIDOCAINE VISCOUS 2 % MT SOLN: 15.0000 mL | OROMUCOSAL | 0 refills | Status: DC | PRN

## 2017-06-23 MED ORDER — PENICILLIN V POTASSIUM 500 MG PO TABS: 500.0000 mg | ORAL_TABLET | Freq: Four times a day (QID) | ORAL | 0 refills | Status: AC

## 2017-06-23 MED ORDER — NAPROXEN 500 MG PO TABS: 500.0000 mg | ORAL_TABLET | Freq: Two times a day (BID) | ORAL | 0 refills | Status: DC

## 2017-06-23 NOTE — ED Provider Notes (Signed)
[facility] EMERGENCY DEPARTMENT Provider Note   CSN: [id number] Arrival date & time: 06/23/17  1927     History   Chief Complaint Chief Complaint  Patient presents with  . Dental Pain    HPI Amy Sutton is a 36 y.o. female presents to the emergency department today for right lower dental pain 1 day. Patient states that last night she started having right lower dental pain, towards the back of the mouth without radiation. Nothing makes the pain worse. She has taken 600mg  ibuprofen for this with relief yesterday but only mild relief today. She has also used salt water rinses and gargles without relief. She denies any fever, chills, N/V, inability to control secretions, abdominal pain, facial/neck swelling, voice change or difficulty breathing. She does have a dentist. She is unsure if she has ever had her wisdom teeth removed.   HPI  Past Medical History:  Diagnosis Date  . Hypertension     Patient Active Problem List   Diagnosis Date Noted  . Asthma 10/05/2016  . Hyperpigmentation 02/09/2015  . Immunization counseling 10/30/2013  . Right knee pain 10/14/2013  . 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 Obesity, morbid ([facility]) 06/24/2013  . Hidradenitis 10/08/2012  . Low back pain 10/08/2012  . Routine general medical examination at a health care facility 05/30/2012  . HTN (hypertension) 05/22/2012    History reviewed. No pertinent surgical history.  OB History    No data available       Home Medications    Prior to Admission medications   Medication Sig Start Date End Date Taking? Authorizing Provider  albuterol (PROAIR HFA) 108 (90 Base) MCG/ACT inhaler Inhale 2 puffs into the lungs every 6 (six) hours as needed for wheezing or shortness of breath. 06/12/17   [name]'Sullivan, [name], NP  amLODipine (NORVASC) 10 MG tablet Take 1 tablet (10 mg total) by mouth daily. 06/19/17   [name]'Sullivan, [name], NP  etonogestrel (NEXPLANON) 68 MG IMPL implant Inject 1 each into the  skin once.    [provider]  hydrochlorothiazide (HYDRODIURIL) 25 MG tablet TAKE 1 TABLET (25 MG TOTAL) BY MOUTH DAILY. 05/17/17   [name]'Sullivan, [name], NP  meloxicam (MOBIC) 7.5 MG tablet TAKE 1 TABLET DAILY 04/25/17   [name]'Sullivan, [name], NP  metoprolol succinate (TOPROL XL) 50 MG 24 hr tablet Take 1 tablet (50 mg total) by mouth daily. Take with or immediately following a meal. 04/10/17   [name]'Sullivan, [name], NP  metoprolol succinate (TOPROL-XL) 100 MG 24 hr tablet Take 1 tablet (100 mg total) by mouth daily. Take with or immediately following a meal. 04/10/17   [name]'Sullivan, [name], NP  olopatadine (PATANOL) 0.1 % ophthalmic solution Place 1 drop into both eyes 2 (two) times daily. 08/01/16   [provider]    Family History Family History  Problem Relation Age of Onset  . Arthritis Mother   . Hypertension Mother   . Diabetes Mother   . Hyperlipidemia Mother   . Hypertension Father   . Diabetes Father   . Hyperlipidemia Father   . Cancer Maternal Uncle        prostate  . Cancer Paternal Aunt     Social History Social History  Substance Use Topics  . Smoking status: Never Smoker  . Smokeless tobacco: Never Used  . Alcohol use 1.5 oz/week    3 drink(s) per week     Allergies   Patient has no known allergies.   Review of Systems Review of Systems  Constitutional: Negative  for chills and fever.  HENT: Positive for dental problem. Negative for drooling, sinus pain, trouble swallowing and voice change.   Respiratory: Negative for shortness of breath.   Gastrointestinal: Negative for abdominal pain, nausea and vomiting.     Physical Exam Updated Vital Signs BP (!) 162/96 Comment: states she has not taken her BP medication  Pulse 91   Temp 98.1 F (36.7 C) (Oral)   Resp 20   Ht 5\' 9"  (1.753 m)   Wt (!) 145.6 kg (321 lb)   SpO2 100%   BMI 47.40 kg/m   Physical Exam  Constitutional: She appears well-developed and well-nourished. No distress.  HENT:    Head: Normocephalic and atraumatic.  Right Ear: Hearing, tympanic membrane, external ear and ear canal normal. No foreign bodies. Tympanic membrane is not injected, not perforated, not erythematous, not retracted and not bulging.  Left Ear: Hearing, tympanic membrane, external ear and ear canal normal. No foreign bodies. Tympanic membrane is not injected, not perforated, not erythematous, not retracted and not bulging.  Nose: Nose normal. No mucosal edema, rhinorrhea, sinus tenderness, septal deviation or nasal septal hematoma.  No foreign bodies. Right sinus exhibits no maxillary sinus tenderness and no frontal sinus tenderness. Left sinus exhibits no maxillary sinus tenderness and no frontal sinus tenderness.  The patient has normal phonation and is in control of secretions. No stridor.  Midline uvula without edema. Soft palate rises symmetrically. No tonsillar erythema or exudates. No PTA. Tongue protrusion is normal. No trismus. No creptius on neck palpation. No facial swelling or neck swelling. No floor edema. Patient with broken furthest back lower right tooth. No gingival erythema or fluctuance noted. Mucus membranes moist.  Eyes: Pupils are equal, round, and reactive to light. Conjunctivae, EOM and lids are normal. Right eye exhibits no discharge. Left eye exhibits no discharge. Right conjunctiva is not injected. Left conjunctiva is not injected. No scleral icterus.  Neck: Trachea normal, normal range of motion, full passive range of motion without pain and phonation normal. Neck supple. No spinous process tenderness and no muscular tenderness present. No neck rigidity. No tracheal deviation and normal range of motion present.  Cardiovascular: Normal rate and regular rhythm.   Pulmonary/Chest: Effort normal and breath sounds normal. No stridor. No respiratory distress. She has no wheezes.  Abdominal: Soft.  Lymphadenopathy:       Head (right side): No submental, no submandibular, no tonsillar,  no preauricular, no posterior auricular and no occipital adenopathy present.       Head (left side): No submental, no submandibular, no tonsillar, no preauricular, no posterior auricular and no occipital adenopathy present.    She has no cervical adenopathy.  Neurological: She is alert.  Speech clear. Follows commands. No facial droop. PERRLA. EOM grossly intact. CN III-XII grossly intact. Grossly moves all extremities 4 without ataxia. Able and appropriate strength for age to upper and lower extremities bilaterally including grip strength.    Skin: Skin is warm and dry. No rash noted. No pallor.  Psychiatric: She has a normal mood and affect.  Nursing note and vitals reviewed.    ED Treatments / Results  Labs (all labs ordered are listed, but only abnormal results are displayed) Labs Reviewed - No data to display  EKG  EKG Interpretation None       Radiology No results found.  Procedures Procedures (including critical care time)  Medications Ordered in ED Medications - No data to display   Initial Impression / Assessment and Plan /  ED Course  I have reviewed the triage vital signs and the nursing notes.  Pertinent labs & imaging results that were available during my care of the patient were reviewed by me and considered in my medical decision making (see chart for details).     Patient with toothache.  No gross abscess.  Exam unconcerning for Ludwig's angina or spread of infection.  Will treat with penicillin and anti-inflammatories medicine.  Urged patient to follow-up with dentist.    Final Clinical Impressions(s) / ED Diagnoses   Final diagnoses:  Pain, dental    New Prescriptions New Prescriptions   No medications on file     Princella Pellegrini 06/23/17 2330    Lavera Guise, MD 06/24/17 [phone]

## 2017-06-23 NOTE — Discharge Instructions (Signed)
Please read and follow all provided instructions.  Your diagnoses today include:  1. Pain, dental     The exam and treatment you received today has been provided on an emergency basis only. This is not a substitute for complete medical or dental care. This problem will not resolve on its own without the care of a dentist.  Tests performed today include: 1. Vital signs. See below for your results today.   Medications prescribed:   Take any prescribed medications only as directed. Use ibuprofen or naproxen for pain. Use the viscous lidocaine for mouth pain. Swish with the lidocaine and spit it out. Do not swallow it.  Please take all of your antibiotics until finished!   You may develop abdominal discomfort or diarrhea from the antibiotic.  You may help offset this with probiotics which you can buy or get in yogurt. Do not eat or take the probiotics until 2 hours after your antibiotic. Do not take your medicine if develop an itchy rash, swelling in your mouth or lips, or difficulty breathing.    Home care instructions:  Follow any educational materials contained in this packet.  Follow-up instructions: Please follow-up with your dentist for further evaluation of your symptoms.   Please Contact this number: [phone] to get into contact with an emergency dental service to schedule an appointment with a local dentist.   Dental Assistance: See below for dental referrals  Return instructions:  1. Please return to the Emergency Department if you experience worsening symptoms. 2. Please return if you develop a fever, you develop more swelling in your face or neck, you have trouble breathing or swallowing food. 3. Please return if you have any other emergent concerns.  Additional Information:  Your vital signs today were: BP (!) 162/96 Comment: states she has not taken her BP medication   Pulse 91    Temp 98.1 F (36.7 C) (Oral)    Resp 20    Ht 5\' 9"  (1.753 m)    Wt (!) 145.6 kg  (321 lb)    SpO2 100%    BMI 47.40 kg/m  If your blood pressure (BP) was elevated above 135/85 this visit, please have this repeated by your doctor within one month. --------------

## 2017-06-23 NOTE — ED Triage Notes (Signed)
Dental pain. She took Ibuprofen 600 mg 3 hours ago. No relief.

## 2017-06-24 ENCOUNTER — Other Ambulatory Visit: Payer: Self-pay | Admitting: Family

## 2017-06-26 NOTE — Telephone Encounter (Signed)
Rx denied.  Rx was refilled on (05/17/17;#90,1).//AB/CMA

## 2017-07-10 ENCOUNTER — Ambulatory Visit: Payer: BLUE CROSS/BLUE SHIELD | Admitting: Family

## 2017-07-10 DIAGNOSIS — Z0289 Encounter for other administrative examinations: Secondary | ICD-10-CM

## 2017-07-17 ENCOUNTER — Other Ambulatory Visit (HOSPITAL_COMMUNITY)
Admission: RE | Admit: 2017-07-17 | Discharge: 2017-07-17 | Disposition: A | Discharge status: Home / Self Care | Payer: BLUE CROSS/BLUE SHIELD | Source: Ambulatory Visit | Attending: Family Medicine | Admitting: Family Medicine

## 2017-07-17 ENCOUNTER — Encounter: Payer: Self-pay | Admitting: Family

## 2017-07-17 ENCOUNTER — Other Ambulatory Visit (HOSPITAL_COMMUNITY)
Admission: RE | Admit: 2017-07-17 | Discharge: 2017-07-17 | Disposition: A | Discharge status: Home / Self Care | Payer: BLUE CROSS/BLUE SHIELD | Source: Ambulatory Visit | Attending: Family | Admitting: Family

## 2017-07-17 ENCOUNTER — Ambulatory Visit (INDEPENDENT_AMBULATORY_CARE_PROVIDER_SITE_OTHER): Payer: BLUE CROSS/BLUE SHIELD | Admitting: Family

## 2017-07-17 VITALS — BP 132/84 | HR 94 | Temp 98.6°F | Resp 16 | Ht 68.0 in | Wt 318.0 lb

## 2017-07-17 DIAGNOSIS — Z01419 Encounter for gynecological examination (general) (routine) without abnormal findings: Secondary | ICD-10-CM | POA: Insufficient documentation

## 2017-07-17 DIAGNOSIS — Z308 Encounter for other contraceptive management: Secondary | ICD-10-CM

## 2017-07-17 DIAGNOSIS — Z0000 Encounter for general adult medical examination without abnormal findings: Secondary | ICD-10-CM | POA: Diagnosis not present

## 2017-07-17 NOTE — Progress Notes (Signed)
Subjective:    Patient ID: Amy Sutton, female    DOB: 18-Sep-1980, 36 y.o.   MRN: [id number]  HPI  Patient presents today for complete physical.  Immunizations:  Up to date Diet: reports that she has cut back on fast food, eating more veggies, chicken/fish, less red meat Wt Readings from Last 3 Encounters:  07/17/17 (!) 318 lb (144.2 kg)  06/23/17 (!) 321 lb (145.6 kg)  06/12/17 (!) 321 lb 6.4 oz (145.8 kg)  Exercise: plans zumba 2 x a week, 2 days treadmill/excercise bike Pap Smear: 2015 Vision: 8/18 Dental: up to date  Review of Systems  Constitutional: Negative for unexpected weight change.  HENT: Negative for hearing loss and rhinorrhea.   Eyes: Negative for visual disturbance.  Respiratory: Negative for cough.   Cardiovascular: Negative for leg swelling.  Gastrointestinal: Negative for constipation and diarrhea.  Genitourinary: Negative for dysuria, frequency and menstrual problem.  Musculoskeletal: Negative for arthralgias and myalgias.  Skin: Negative for rash.  Neurological: Negative for headaches.  Hematological: Negative for adenopathy.  Psychiatric/Behavioral:       Denies depression/anxiety   Past Medical History:  Diagnosis Date  . Hypertension      Social History   Socioeconomic History  . Marital status: Single    Spouse name: Not on file  . Number of children: 1  . Years of education: Not on file  . Highest education level: Not on file  Social Needs  . Financial resource strain: Not on file  . Food insecurity - worry: Not on file  . Food insecurity - inability: Not on file  . Transportation needs - medical: Not on file  . Transportation needs - non-medical: Not on file  Occupational History    Employer: [employer]  Tobacco Use  . Smoking status: Never Smoker  . Smokeless tobacco: Never Used  Substance and Sexual Activity  . Alcohol use: Yes    Alcohol/week: 1.5 oz    Types: 3 drink(s) per week  . Drug use: Not on file    . Sexual activity: Not on file  Other Topics Concern  . 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 No pertinent surgical history.  Family History  Problem Relation Age of Onset  . Arthritis Mother   . Hypertension Mother   . Diabetes Mother   . Hyperlipidemia Mother   . Hypertension Father   . Diabetes Father   . Hyperlipidemia Father   . Cancer Maternal Uncle        prostate  . Cancer Paternal Aunt     No Known Allergies  Current Outpatient Medications on File Prior to Visit  Medication Sig Dispense Refill  . albuterol (PROAIR HFA) 108 (90 Base) MCG/ACT inhaler Inhale 2 puffs into the lungs every 6 (six) hours as needed for wheezing or shortness of breath. 3 Inhaler 1  . amLODipine (NORVASC) 10 MG tablet Take 1 tablet (10 mg total) by mouth daily. 90 tablet 1  . etonogestrel (NEXPLANON) 68 MG IMPL implant Inject 1 each into the skin once.    . hydrochlorothiazide (HYDRODIURIL) 25 MG tablet TAKE 1 TABLET (25 MG TOTAL) BY MOUTH DAILY. 90 tablet 1  . meloxicam (MOBIC) 7.5 MG  tablet TAKE 1 TABLET DAILY 90 tablet 0  . metoprolol succinate (TOPROL XL) 50 MG 24 hr tablet Take 1 tablet (50 mg total) by mouth daily. Take with or immediately following a meal. 90 tablet 1  . metoprolol succinate (TOPROL-XL) 100 MG 24 hr tablet Take 1 tablet (100 mg total) by mouth daily. Take with or immediately following a meal. 90 tablet 1  . olopatadine (PATANOL) 0.1 % ophthalmic solution Place 1 drop into both eyes 2 (two) times daily.     No current facility-administered medications on file prior to visit.     BP 132/84 (BP Location: Right Arm) Comment (Cuff Size): thigh  Pulse 94   Temp 98.6 F (37 C) (Oral)   Resp 16   Ht 5\' 8"  (1.727 m)   Wt (!) 318 lb (144.2 kg)   SpO2 100%   BMI 48.35 kg/m       Objective:    Physical Exam  Physical Exam  Constitutional: She is oriented to person, place, and time. She appears well-developed and well-nourished. No distress.  HENT:  Head: Normocephalic and atraumatic.  Right Ear: Tympanic membrane and ear canal normal.  Left Ear: Tympanic membrane and ear canal normal.  Mouth/Throat: Oropharynx is clear and moist.  Eyes: Pupils are equal, round, and reactive to light. No scleral icterus.  Neck: Normal range of motion. No thyromegaly present.  Cardiovascular: Normal rate and regular rhythm.   No murmur heard. Pulmonary/Chest: Effort normal and breath sounds normal. No respiratory distress. He has no wheezes. She has no rales. She exhibits no tenderness.  Abdominal: Soft. Bowel sounds are normal. She exhibits no distension and no mass. There is no tenderness. There is no rebound and no guarding.  Musculoskeletal: She exhibits no edema.  Lymphadenopathy:    She has no cervical adenopathy.  Neurological: She is alert and oriented to person, place, and time. She has normal patellar reflexes. She exhibits normal muscle tone. Coordination normal.  Skin: Skin is warm and dry.  Psychiatric: She has a normal mood and affect. Her behavior is normal. Judgment and thought content normal.  Breasts: Examined lying Right: Without masses, retractions, discharge or axillary adenopathy.  Left: Without masses, retractions, discharge or axillary adenopathy.            Assessment & Plan:         Assessment & Plan:  Preventative care- Pap performed today.  Discussed importance of healthy diet, regular exercise and weight loss.  She will return for lab work as the lab is closed.  Refer back to GYN for Nexplanon exchange.

## 2017-07-17 NOTE — Patient Instructions (Addendum)
Continue to work on [employer] diet, exercise and weight loss.  Please schedule a lab visit at the front desk.

## 2017-07-19 ENCOUNTER — Other Ambulatory Visit (INDEPENDENT_AMBULATORY_CARE_PROVIDER_SITE_OTHER): Payer: BLUE CROSS/BLUE SHIELD

## 2017-07-19 DIAGNOSIS — Z0000 Encounter for general adult medical examination without abnormal findings: Secondary | ICD-10-CM | POA: Diagnosis not present

## 2017-07-19 LAB — CYTOLOGY - PAP
DIAGNOSIS: NEGATIVE
HPV (WINDOPATH): NOT DETECTED

## 2017-07-19 NOTE — Addendum Note (Signed)
Addended by: Verdie ShireBAYNES, ANGELA M on: 07/19/2017 04:44 PM   Modules accepted: Orders

## 2017-07-20 LAB — CBC WITH DIFFERENTIAL/PLATELET
BASOS ABS: 0.1 10*3/uL (ref 0.0–0.1)
Basophils Relative: 1.2 % (ref 0.0–3.0)
EOS ABS: 0.1 10*3/uL (ref 0.0–0.7)
Eosinophils Relative: 0.9 % (ref 0.0–5.0)
HCT: 40.2 % (ref 36.0–46.0)
Hemoglobin: 12.9 g/dL (ref 12.0–15.0)
LYMPHS ABS: 3.4 10*3/uL (ref 0.7–4.0)
Lymphocytes Relative: 42.8 % (ref 12.0–46.0)
MCHC: 32.1 g/dL (ref 30.0–36.0)
MCV: 83.1 fl (ref 78.0–100.0)
MONOS PCT: 7.7 % (ref 3.0–12.0)
Monocytes Absolute: 0.6 10*3/uL (ref 0.1–1.0)
NEUTROS ABS: 3.8 10*3/uL (ref 1.4–7.7)
NEUTROS PCT: 47.4 % (ref 43.0–77.0)
PLATELETS: 273 10*3/uL (ref 150.0–400.0)
RBC: 4.83 Mil/uL (ref 3.87–5.11)
RDW: 14.5 % (ref 11.5–15.5)
WBC: 8 10*3/uL (ref 4.0–10.5)

## 2017-07-20 LAB — LIPID PANEL
Cholesterol: 157 mg/dL (ref 0–200)
HDL: 34.8 mg/dL — AB (ref >39.00)
LDL CALC: 84 mg/dL (ref 0–99)
NonHDL: 122.6
Total CHOL/HDL Ratio: 5
Triglycerides: 191 mg/dL — ABNORMAL HIGH (ref 0.0–149.0)
VLDL: 38.2 mg/dL (ref 0.0–40.0)

## 2017-07-20 LAB — BASIC METABOLIC PANEL
BUN: 10 mg/dL (ref 6–23)
CALCIUM: 9.6 mg/dL (ref 8.4–10.5)
CO2: 28 meq/L (ref 19–32)
CREATININE: 0.82 mg/dL (ref 0.40–1.20)
Chloride: 104 mEq/L (ref 96–112)
GFR: 101.84 mL/min (ref >60.00)
GLUCOSE: 110 mg/dL — AB (ref 70–99)
Potassium: 4.1 mEq/L (ref 3.5–5.1)
SODIUM: 139 meq/L (ref 135–145)

## 2017-07-20 LAB — HEPATIC FUNCTION PANEL
ALBUMIN: 4 g/dL (ref 3.5–5.2)
ALK PHOS: 62 U/L (ref 39–117)
ALT: 26 U/L (ref 0–35)
AST: 17 U/L (ref 0–37)
BILIRUBIN DIRECT: 0 mg/dL (ref 0.0–0.3)
BILIRUBIN TOTAL: 0.3 mg/dL (ref 0.2–1.2)
Total Protein: 6.8 g/dL (ref 6.0–8.3)

## 2017-07-20 LAB — TSH: TSH: 1.9 u[IU]/mL (ref 0.35–4.50)

## 2017-07-24 ENCOUNTER — Other Ambulatory Visit: Payer: Self-pay | Admitting: Family

## 2017-08-31 ENCOUNTER — Ambulatory Visit: Payer: Self-pay | Admitting: Gynecology

## 2017-09-10 ENCOUNTER — Other Ambulatory Visit: Payer: Self-pay | Admitting: Family

## 2017-09-21 ENCOUNTER — Ambulatory Visit: Payer: BLUE CROSS/BLUE SHIELD | Admitting: Gynecology

## 2017-09-21 ENCOUNTER — Encounter: Payer: Self-pay | Admitting: Gynecology

## 2017-09-21 VITALS — BP 130/84 | Ht 68.0 in | Wt 321.0 lb

## 2017-09-21 DIAGNOSIS — Z309 Encounter for contraceptive management, unspecified: Secondary | ICD-10-CM | POA: Diagnosis not present

## 2017-09-21 NOTE — Patient Instructions (Signed)
Follow-up for Nexplanon replacement appointment 

## 2017-09-21 NOTE — Progress Notes (Signed)
    Amy BoroughsDanielle I Kozar 1981/01/04 [id number]        37 y.o.  G1P1 new patient presents to discuss replacing her Nexplanon that was placed 10/2013.  Was having menses prior to placement and now is amenorrheic.  Has not been sexually active since the 3-year expiration timeframe for the Nexplanon.  She understands she is overdue to have it replaced.  Recently had annual exam with her primary provider to include a normal Pap smear/HPV.  Past medical history,surgical history, problem list, medications, allergies, family history and social history were all reviewed and documented in the EPIC chart.  Directed ROS with pertinent positives and negatives documented in the history of present illness/assessment and plan.  Exam:  Vitals:   09/21/17 1430  BP: 130/84  Weight: (!) 321 lb (145.6 kg)  Height: 5\' 8"  (1.727 m)   General appearance:  Normal Left upper inner arm examined and Nexplanon rod clearly palpated subcutaneously.  Assessment/Plan:  37 y.o. G1P1 for Nexplanon replacement.  I reviewed the removal and replacement procedure with her.  I discussed the risks to include infection, neurovascular injury with potential for permanent sequela, migration of the Nexplanon as well as difficulty removing it, progesterone side effects, irregular bleeding, failure with pregnancy were all reviewed.  Patient understands and accepts and will make an appointment to return to have this replaced.  I stressed the need to remain abstinent until it is replaced and this is not an issue for her.   Dara Lordsimothy P Chaske Paskett MD, 2:59 PM 09/21/2017

## 2017-10-20 ENCOUNTER — Ambulatory Visit: Payer: BLUE CROSS/BLUE SHIELD | Admitting: Gynecology

## 2017-10-22 ENCOUNTER — Other Ambulatory Visit: Payer: Self-pay | Admitting: Family

## 2017-11-03 ENCOUNTER — Encounter: Payer: Self-pay | Admitting: Gynecology

## 2017-11-03 ENCOUNTER — Ambulatory Visit (INDEPENDENT_AMBULATORY_CARE_PROVIDER_SITE_OTHER): Payer: BLUE CROSS/BLUE SHIELD | Admitting: Gynecology

## 2017-11-03 VITALS — BP 124/80

## 2017-11-03 DIAGNOSIS — Z30017 Encounter for initial prescription of implantable subdermal contraceptive: Secondary | ICD-10-CM

## 2017-11-03 DIAGNOSIS — Z3046 Encounter for surveillance of implantable subdermal contraceptive: Secondary | ICD-10-CM

## 2017-11-03 NOTE — Progress Notes (Signed)
    [name] Mar 23, 1981 [id number]        37 y.o.  G1P1 presents for Nexplanon removal and reinsertion. She previously has been counseled for her contraceptive options and she elects for nexplanon replacement. I reviewed the Nexplanon removal and insertional process and the side effects/risks. I reviewed irregular bleeding, insertion site infections, underlying neurovascular damage with permanent sequela, migration of the implant making removal difficult requiring surgery, the need to have it removed in 3 years under a separate procedure and lastly the risk of failure with pregnancy.  Patient again confirms that she has not been sexually active for over a year since her Nexplanon protection timeframe.  She has read through the consent form and signed it.  Procedure with Kennon PortelaKim Gardner assistant: The left upper, inner arm was examined with the Nexplanon rod clearly palpated.  The previous insertion site is higher on the inner arm than the most recent updated recommended placement site.  I discussed this with the patient and the option of removing the Nexplanon and inserting it through a separate incision lower on the arm versus going through her present insertion site given that she has had this here for 4 years and had no issues all discussed with her.  The patient prefers to use the same insertion site.  The skin overlying the distal portion of the rod was cleansed with Betadine and infiltrated with 1% lidocaine. A small skin incision was made with a scalpel over the distal tip of the Nexplanon rod. The Nexplanon tip was subsequently delivered through the incision using blunt and sharp dissection and the tip was grasped with a forcep and the Nexplanon rod was removed intact, shown to the patient and discarded.  The insertional tract was then infiltrated with 1% lidocaine. The new Nexplanon was placed according to manufacturer's recommendation without difficulty. The skin defect was closed with a  Steri-Strip. The patient palpated the rod. A pressure dressing was applied and postoperative instructions give her.   Lot number:  [id number]     Dara Lordsimothy P Redell Bhandari MD, 12:13 PM 11/03/2017  The   A Steri-Strip was used to close the small skin defect and a pressure dressing was applied with postoperative instructions given.

## 2017-11-03 NOTE — Patient Instructions (Signed)
Follow-up if any issues with the Nexplanon 

## 2017-11-13 ENCOUNTER — Ambulatory Visit: Payer: BLUE CROSS/BLUE SHIELD | Admitting: Family

## 2017-12-16 ENCOUNTER — Other Ambulatory Visit: Payer: Self-pay | Admitting: Family

## 2018-01-12 ENCOUNTER — Other Ambulatory Visit: Payer: Self-pay | Admitting: Family

## 2018-01-15 ENCOUNTER — Ambulatory Visit: Payer: BLUE CROSS/BLUE SHIELD | Admitting: Family

## 2018-01-22 ENCOUNTER — Other Ambulatory Visit: Payer: Self-pay | Admitting: Family

## 2018-01-22 ENCOUNTER — Ambulatory Visit (INDEPENDENT_AMBULATORY_CARE_PROVIDER_SITE_OTHER): Payer: BLUE CROSS/BLUE SHIELD | Admitting: Family

## 2018-01-22 ENCOUNTER — Encounter: Payer: Self-pay | Admitting: Family

## 2018-01-22 VITALS — BP 152/85 | HR 96 | Temp 98.9°F | Resp 16 | Ht 68.0 in | Wt 308.0 lb

## 2018-01-22 DIAGNOSIS — R739 Hyperglycemia, unspecified: Secondary | ICD-10-CM | POA: Diagnosis not present

## 2018-01-22 DIAGNOSIS — I10 Essential (primary) hypertension: Secondary | ICD-10-CM | POA: Diagnosis not present

## 2018-01-22 DIAGNOSIS — J45909 Unspecified asthma, uncomplicated: Secondary | ICD-10-CM

## 2018-01-22 NOTE — Progress Notes (Signed)
Subjective:    Patient ID: Amy Sutton, female    DOB: 1981-06-05, 37 y.o.   MRN: [id number]  HPI   Amy Sutton is a 37 yr old female who presents today for follow up of her hypertension. She is maintained on amlodipine , hctz , toprol xl ,  Denies CP/SOB/swelling.  BP Readings from Last 3 Encounters:  01/22/18 (!) 152/85  11/03/17 124/80  09/21/17 130/84   Asthma- maintained on prn albuterol. She reports that asthma has been well controlled.  Only using albuterol about once a week.       Review of Systems See HPI  Past Medical History:  Diagnosis Date  . Hypertension      Social History   Socioeconomic History  . Marital status: Single    Spouse name: Not on file  . Number of children: 1  . Years of education: Not on file  . Highest education level: Not on file  Occupational History    Employer: [employer]  Social Needs  . Financial resource strain: Not on file  . Food insecurity:    Worry: Not on file    Inability: Not on file  . Transportation needs:    Medical: Not on file    Non-medical: Not on file  Tobacco Use  . Smoking status: Never Smoker  . Smokeless tobacco: Never Used  Substance and Sexual Activity  . Alcohol use: Yes    Alcohol/week: 1.5 oz    Types: 3 Standard drinks or equivalent per week  . Drug use: No  . Sexual activity: Not Currently    Birth control/protection: Implant    Comment: 1st intercourse 18 yo-5 partners-Nexplanon inserted  2 -2015  Lifestyle  . Physical activity:    Days per week: Not on file    Minutes per session: Not on file  . Stress: Not on file  Relationships  . Social connections:    Talks on phone: Not on file    Gets together: Not on file    Attends religious service: Not on file    Active member of club or organization: Not on file    Attends meetings of clubs or organizations: Not on file    Relationship status: Not on file  . Intimate partner violence:    Fear of current or  ex partner: Not on file    Emotionally abused: Not on file    Physically abused: Not on file    Forced sexual activity: Not on file  Other Topics Concern  . Not on file  Social History Narrative   Regular exercise:  Not currently   Caffeine Use:  Sometimes   74 yr old son   Consulting civil engineer at [employer].   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 Nexplanon     Inserted 10-2013    Family History  Problem Relation Age of Onset  . Arthritis Mother   . Hypertension Mother   . Diabetes Mother   . Hyperlipidemia Mother   . Hypertension Father   . Diabetes Father   . Hyperlipidemia Father   . Cancer Maternal Uncle        prostate  . Cancer Paternal Aunt        Lymphoma    No Known Allergies  Current Outpatient Medications on File Prior to Visit  Medication Sig Dispense Refill  .  albuterol (PROAIR HFA) 108 (90 Base) MCG/ACT inhaler Inhale 2 puffs into the lungs every 6 (six) hours as needed for wheezing or shortness of breath. 3 Inhaler 1  . amLODipine (NORVASC) 10 MG tablet Take 1 tablet (10 mg total) by mouth daily. 90 tablet 1  . etonogestrel (NEXPLANON) 68 MG IMPL implant Inject 1 each into the skin once.    . hydrochlorothiazide (HYDRODIURIL) 25 MG tablet TAKE 1 TABLET (25 MG TOTAL) BY MOUTH DAILY. 90 tablet 1  . meloxicam (MOBIC) 7.5 MG tablet TAKE 1 TABLET DAILY. NEED  OFFICE VISIT FOR FURTHER   REFILLS 90 tablet 0  . metoprolol succinate (TOPROL-XL) 100 MG 24 hr tablet Take 1 tablet (100 mg total) by mouth daily. Take with or immediately following a meal. 90 tablet 1  . metoprolol succinate (TOPROL-XL) 50 MG 24 hr tablet TAKE 1 TABLET BY MOUTH DAILY. TAKE WITH OR IMMEDIATELY FOLLOWING A MEAL. 90 tablet 1  . olopatadine (PATANOL) 0.1 % ophthalmic solution Place 1 drop into both eyes 2 (two) times daily.     No current facility-administered medications on file prior to visit.     BP (!) 152/85    Pulse 96   Temp 98.9 F (37.2 C) (Oral)   Resp 16   Ht  (1.727 m)   Wt (!) 308 lb (139.7 kg)   SpO2 100%   BMI 46.83 kg/m       Objective:   Physical Exam  Constitutional: She appears well-developed and well-nourished.  Cardiovascular: Normal rate, regular rhythm and normal heart sounds.  No murmur heard. Pulmonary/Chest: Effort normal and breath sounds normal. No respiratory distress. She has no wheezes.  Psychiatric: She has a normal mood and affect. Her behavior is normal. Judgment and thought content normal.          Assessment & Plan:  Asthma- stable on prn albuterol. Continue same.  Lab Results  Component Value Date   HGBA1C 5.5 10/16/2015   HTN- uncontrolled. Reports she ran out of hctz 2 weeks ago. Restart hctz. Plan bmet when she returns in 2 weeks for nurse visit BP check.    Hyperglycemia- check a1C.

## 2018-01-22 NOTE — Patient Instructions (Signed)
Re start hctz 

## 2018-02-05 ENCOUNTER — Ambulatory Visit: Payer: BLUE CROSS/BLUE SHIELD | Admitting: Family

## 2018-05-22 ENCOUNTER — Other Ambulatory Visit: Payer: Self-pay | Admitting: Family

## 2018-05-23 NOTE — Telephone Encounter (Signed)
Please advise refill request.  Pt last seen 01/22/18 and advised nurse visit for BP check and labs in 2 weeks. Appt was cancelled and there are no future appts scheduled for pt.

## 2018-05-23 NOTE — Telephone Encounter (Signed)
Please send a 7 day supply and schedule follow up.

## 2018-05-24 NOTE — Telephone Encounter (Signed)
Author phoned pt. To set up OV per [name], PCP, and to confirm dosage of metoprolol taken everyday. No answre, author left detailed VM asking for return call. OK for PEC to confirm metoprolol dosage and refill for 7 days if appropriate per PCP, and schedule OV in next week with PCP.

## 2018-06-11 ENCOUNTER — Other Ambulatory Visit: Payer: Self-pay | Admitting: Family

## 2018-06-11 ENCOUNTER — Encounter: Payer: Self-pay | Admitting: Family

## 2018-06-11 ENCOUNTER — Ambulatory Visit: Payer: 59 | Admitting: Family

## 2018-06-11 VITALS — BP 168/84 | HR 86 | Temp 98.7°F | Resp 16 | Ht 68.0 in | Wt 307.0 lb

## 2018-06-11 DIAGNOSIS — G8929 Other chronic pain: Secondary | ICD-10-CM

## 2018-06-11 DIAGNOSIS — R739 Hyperglycemia, unspecified: Secondary | ICD-10-CM

## 2018-06-11 DIAGNOSIS — Z23 Encounter for immunization: Secondary | ICD-10-CM

## 2018-06-11 DIAGNOSIS — J4520 Mild intermittent asthma, uncomplicated: Secondary | ICD-10-CM | POA: Diagnosis not present

## 2018-06-11 DIAGNOSIS — M25561 Pain in right knee: Secondary | ICD-10-CM | POA: Diagnosis not present

## 2018-06-11 MED ORDER — HYDROCHLOROTHIAZIDE 25 MG PO TABS: 25.0000 mg | ORAL_TABLET | Freq: Every day | ORAL | 1 refills | Status: DC

## 2018-06-11 MED ORDER — AMLODIPINE BESYLATE 10 MG PO TABS: 10.0000 mg | ORAL_TABLET | Freq: Every day | ORAL | 1 refills | Status: DC

## 2018-06-11 MED ORDER — METOPROLOL SUCCINATE ER 100 MG PO TB24: 100.0000 mg | ORAL_TABLET | Freq: Every day | ORAL | 1 refills | Status: DC

## 2018-06-11 MED ORDER — METOPROLOL SUCCINATE ER 50 MG PO TB24: ORAL_TABLET | ORAL | 1 refills | Status: DC

## 2018-06-11 NOTE — Patient Instructions (Addendum)
Please complete lab work prior to leaving. You should be contacted about your appointment with sports medicine for your knee  Restart toprol 50mg .

## 2018-06-11 NOTE — Progress Notes (Signed)
Subjective:    Patient ID: Amy Sutton, female    DOB: 12/08/1980, 37 y.o.   MRN: [id number]  HPI  Patient is a 36 year old female who presents today for follow-up.  Hypertension-reports that she is out of the toprol xl 50mg .  BP Readings from Last 3 Encounters:  06/11/18 (!) 168/84  01/22/18 (!) 152/85  11/03/17 124/80   Asthma- reports well conrtolled with prn albuterol    Obesity- working on weight loss.   Right knee pain- pain has been present for on/off x 6 months. Pain is worsened by bending and walking upstairs.  Meloxicam does not help at all.  Denies knee swelling.   Review of Systems See HPI  Past Medical History:  Diagnosis Date  . Hypertension      Social History   Socioeconomic History  . Marital status: Single    Spouse name: Not on file  . Number of children: 1  . Years of education: Not on file  . Highest education level: Not on file  Occupational History    Employer: [employer]  Social Needs  . Financial resource strain: Not on file  . Food insecurity:    Worry: Not on file    Inability: Not on file  . Transportation needs:    Medical: Not on file    Non-medical: Not on file  Tobacco Use  . Smoking status: Never Smoker  . Smokeless tobacco: Never Used  Substance and Sexual Activity  . Alcohol use: Yes    Alcohol/week: 3.0 standard drinks    Types: 3 Standard drinks or equivalent per week  . Drug use: No  . Sexual activity: Not Currently    Birth control/protection: Implant    Comment: 1st intercourse 18 yo-5 partners-Nexplanon inserted  2 -2015  Lifestyle  . Physical activity:    Days per week: Not on file    Minutes per session: Not on file  . Stress: Not on file  Relationships  . Social connections:    Talks on phone: Not on file    Gets together: Not on file    Attends religious service: Not on file    Active member of club or organization: Not on file    Attends meetings of clubs or organizations: Not on file      Relationship status: Not on file  . Intimate partner violence:    Fear of current or ex partner: Not on file    Emotionally abused: Not on file    Physically abused: Not on file    Forced sexual activity: Not on file  Other Topics Concern  . Not on file  Social History Narrative   Regular exercise:  Not currently   Caffeine Use:  Sometimes   65 yr old son   Consulting civil engineer at [employer].   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 Nexplanon     Inserted 10-2013    Family History  Problem Relation Age of Onset  . Arthritis Mother   . Hypertension Mother   . Diabetes Mother   . Hyperlipidemia Mother   . Hypertension Father   . Diabetes Father   . Hyperlipidemia Father   . Cancer Maternal Uncle        prostate  . Cancer Paternal Aunt        Lymphoma    No Known Allergies  Current Outpatient Medications on File Prior to Visit  Medication Sig Dispense Refill  . albuterol (PROAIR HFA) 108 (90 Base) MCG/ACT inhaler Inhale 2 puffs into the lungs every 6 (six) hours as needed for wheezing or shortness of breath. 3 Inhaler 1  . etonogestrel (NEXPLANON) 68 MG IMPL implant Inject 1 each into the skin once.    . meloxicam (MOBIC) 7.5 MG tablet TAKE 1 TABLET DAILY. NEED  OFFICE VISIT FOR FURTHER   REFILLS 90 tablet 0  . olopatadine (PATANOL) 0.1 % ophthalmic solution Place 1 drop into both eyes 2 (two) times daily.     No current facility-administered medications on file prior to visit.     BP (!) 168/84 (BP Location: Right Arm, Patient Position: Sitting, Cuff Size: Large)   Pulse 86   Temp 98.7 F (37.1 C) (Oral)   Resp 16   Ht 5\' 8"  (1.727 m)   Wt (!) 307 lb (139.3 kg)   SpO2 99%   BMI 46.68 kg/m       Objective:   Physical Exam  Constitutional: She is oriented to person, place, and time. She appears well-developed and well-nourished.  Cardiovascular: Normal rate, regular rhythm and  normal heart sounds.  No murmur heard. Pulmonary/Chest: Effort normal and breath sounds normal. No respiratory distress. She has no wheezes.  Musculoskeletal:  Right knee without swelling.   Neurological: She is alert and oriented to person, place, and time.  Psychiatric: She has a normal mood and affect. Her behavior is normal. Judgment and thought content normal.          Assessment & Plan:  Right knee pain-we discussed the importance of weight loss and helping her knee pain.  Will refer to sports medicine for further evaluation.  Hypertension-this is uncontrolled.  She is not taking the 50 mg tablet of her Toprol-XL as she ran out.  Resume all meds.  Plan repeat blood pressure here in 1 month.  If still above goal will need to further adjust medications.  Asthma-this is stable with as needed albuterol.  Morbid obesity- we discussed importance of weight loss.

## 2018-06-12 LAB — COMPREHENSIVE METABOLIC PANEL
ALBUMIN: 4.2 g/dL (ref 3.5–5.2)
ALK PHOS: 67 U/L (ref 39–117)
ALT: 14 U/L (ref 0–35)
AST: 11 U/L (ref 0–37)
BUN: 10 mg/dL (ref 6–23)
CHLORIDE: 104 meq/L (ref 96–112)
CO2: 26 mEq/L (ref 19–32)
CREATININE: 0.74 mg/dL (ref 0.40–1.20)
Calcium: 9.6 mg/dL (ref 8.4–10.5)
GFR: 114.06 mL/min (ref >60.00)
Glucose, Bld: 88 mg/dL (ref 70–99)
Potassium: 3.6 mEq/L (ref 3.5–5.1)
Sodium: 139 mEq/L (ref 135–145)
Total Bilirubin: 0.3 mg/dL (ref 0.2–1.2)
Total Protein: 6.9 g/dL (ref 6.0–8.3)

## 2018-06-12 LAB — HEMOGLOBIN A1C: Hgb A1c MFr Bld: 5.7 % (ref 4.6–6.5)

## 2018-07-09 ENCOUNTER — Ambulatory Visit: Payer: 59 | Admitting: Family

## 2018-07-16 ENCOUNTER — Ambulatory Visit: Payer: 59 | Admitting: Family Medicine

## 2018-07-16 ENCOUNTER — Encounter: Payer: Self-pay | Admitting: Family Medicine

## 2018-07-16 VITALS — BP 151/90 | HR 76 | Ht 69.0 in | Wt 307.0 lb

## 2018-07-16 DIAGNOSIS — M7651 Patellar tendinitis, right knee: Secondary | ICD-10-CM

## 2018-07-16 DIAGNOSIS — M1712 Unilateral primary osteoarthritis, left knee: Secondary | ICD-10-CM | POA: Diagnosis not present

## 2018-07-16 MED ORDER — DICLOFENAC SODIUM 75 MG PO TBEC: 75.0000 mg | DELAYED_RELEASE_TABLET | Freq: Two times a day (BID) | ORAL | 1 refills | Status: DC

## 2018-07-16 MED ORDER — METHYLPREDNISOLONE ACETATE 40 MG/ML IJ SUSP
40.0000 mg | Freq: Once | INTRAMUSCULAR | Status: AC
  Administered 2018-07-16: 40 mg via INTRA_ARTICULAR

## 2018-07-16 NOTE — Progress Notes (Signed)
PCP and consultation requested by: Amy Craze, NP  Subjective:   HPI: Patient is a 37 y.o. female here for bilateral knee pain.  Overall patient's pain has persisted for more than last year.  She denies any specific injury to either knee.  Her left knee she describes as 5-6/10 and is worse than the right.  She localizes her pain over the lateral and posterior aspect of the knee.  This pain is relatively constant and aching.  Generally worse with activity but no specific movements.  It sometimes feels like it is going to give out and buckle. Her right knee pain is reportedly worse.  This is 7/10.  This pain is located more anteriorly.  Is made worse with stairs or when squatting.  She occasionally takes Tylenol which is minimally helpful. Generally, she denies any swelling, erythema, bruising.  No numbness or tingling lower extremities.  No associated skin changes.  Past Medical History:  Diagnosis Date  . Hypertension     Current Outpatient Medications on File Prior to Visit  Medication Sig Dispense Refill  . albuterol (PROAIR HFA) 108 (90 Base) MCG/ACT inhaler Inhale 2 puffs into the lungs every 6 (six) hours as needed for wheezing or shortness of breath. 3 Inhaler 1  . amLODipine (NORVASC) 10 MG tablet Take 1 tablet (10 mg total) by mouth daily. 90 tablet 1  . etonogestrel (NEXPLANON) 68 MG IMPL implant Inject 1 each into the skin once.    . hydrochlorothiazide (HYDRODIURIL) 25 MG tablet Take 1 tablet (25 mg total) by mouth daily. 90 tablet 1  . meloxicam (MOBIC) 7.5 MG tablet TAKE 1 TABLET DAILY. NEED  OFFICE VISIT FOR FURTHER   REFILLS 90 tablet 0  . metoprolol succinate (TOPROL-XL) 100 MG 24 hr tablet Take 1 tablet (100 mg total) by mouth daily. Take with or immediately following a meal. 90 tablet 1  . metoprolol succinate (TOPROL-XL) 50 MG 24 hr tablet TAKE 1 TABLET BY MOUTH DAILY. TAKE WITH OR IMMEDIATELY FOLLOWING A MEAL. 90 tablet 1  . olopatadine (PATANOL) 0.1 %  ophthalmic solution Place 1 drop into both eyes 2 (two) times daily.     No current facility-administered medications on file prior to visit.     Past Surgical History:  Procedure Laterality Date  . Nexplanon     Inserted 10-2013    No Known Allergies  Social History   Socioeconomic History  . Marital status: Single    Spouse name: Not on file  . Number of children: 1  . Years of education: Not on file  . Highest education level: Not on file  Occupational History    Employer: [employer]  Social Needs  . Financial resource strain: Not on file  . Food insecurity:    Worry: Not on file    Inability: Not on file  . Transportation needs:    Medical: Not on file    Non-medical: Not on file  Tobacco Use  . Smoking status: Never Smoker  . Smokeless tobacco: Never Used  Substance and Sexual Activity  . Alcohol use: Yes    Alcohol/week: 3.0 standard drinks    Types: 3 Standard drinks or equivalent per week  . Drug use: No  . Sexual activity: Not Currently    Birth control/protection: Implant    Comment: 1st intercourse 18 yo-5 partners-Nexplanon inserted  2 -2015  Lifestyle  . Physical activity:    Days per week: Not on file    Minutes per session: Not  on file  . Stress: Not on file  Relationships  . Social connections:    Talks on phone: Not on file    Gets together: Not on file    Attends religious service: Not on file    Active member of club or organization: Not on file    Attends meetings of clubs or organizations: Not on file    Relationship status: Not on file  . Intimate partner violence:    Fear of current or ex partner: Not on file    Emotionally abused: Not on file    Physically abused: Not on file    Forced sexual activity: Not on file  Other Topics Concern  . Not on file  Social History Narrative   Regular exercise:  Not currently   Caffeine Use:  Sometimes   6 yr old son   Consulting civil engineer at [employer].   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 Arthritis Mother   . Hypertension Mother   . Diabetes Mother   . Hyperlipidemia Mother   . Hypertension Father   . Diabetes Father   . Hyperlipidemia Father   . Cancer Maternal Uncle        prostate  . Cancer Paternal Aunt        Lymphoma    BP (!) 151/90   Pulse 76   Ht 5\' 9"  (1.753 m)   Wt (!) 307 lb (139.3 kg)   BMI 45.34 kg/m   Review of Systems: See HPI above.     Objective:  Physical Exam:  Gen: awake, alert, NAD, comfortable in exam room Pulm: breathing unlabored  Right knee: - Inspection: Genu valgus. No swelling/effusion, erythema or bruising. Skin intact - Palpation: She is primarily tender over the patellar tendon.  There is also some mild diffuse tenderness. - ROM: full active ROM with flexion and extension in knee  - Strength: 5/5 strength.  Mild pain reproduced with resisted knee extension - Neuro/vasc: NV intact - Special Tests: - LIGAMENTS: negative anterior and posterior drawer, negative Lachman's, no MCL or LCL laxity  -- MENISCUS: negative McMurray's -- PF JOINT: nml patellar mobility without apprehension   Left knee: - Inspection: genu valgus. No swelling/effusion, erythema or bruising. Skin intact - Palpation: Tenderness over the lateral joint line - ROM: full active ROM with flexion and extension in knee - Strength: 5/5 strength - Neuro/vasc: NV intact - Special Tests: - LIGAMENTS: negative anterior and posterior drawer, negative Lachman's, no MCL or LCL laxity  -- MENISCUS: negative McMurray's, negative Thessaly        Assessment & Plan:  1.  Right knee pain-her right knee is secondary to patellar tendinitis. - Ice - Diclofenac 75 mg twice daily as needed for pain - Home exercises for rehab.  Consider formal physical therapy if no improvement   2.  Left knee pain-her left knee pain is more consistent with mild arthritis. - Ice -Diclofenac 25  mg twice daily as needed -Home exercises - Steroid injection performed today as detailed below -Follow-up 6 weeks  Procedure performed: knee intraarticular corticosteroid injection; palpation guided  Consent obtained and verified. Time-out conducted. Noted no overlying erythema, induration, or other signs of local infection. The left medial joint space was identified by ultrasound and marked. The overlying skin was prepped in a sterile fashion. Topical analgesic spray: Ethyl chloride. Joint: left  knee Needle: 25 g 1 1/2" Completed without difficulty. Meds: Depo-Medrol 40 mg, bupivacaine 3 cc  Advised to call if fevers/chills, erythema, induration, drainage, or persistent bleeding.

## 2018-07-16 NOTE — Patient Instructions (Signed)
Your left knee pain is due to arthritis. These are the different medications you can take for this: Tylenol 500mg  1-2 tabs three times a day for pain. Capsaicin, aspercreme, or biofreeze topically up to four times a day may also help with pain. Some supplements that may help for arthritis: Boswellia extract, curcumin, pycnogenol Diclofenac twice a day with food for pain and inflammation - take for 7-10 days then as needed. Cortisone injections are an option - you were given this today. If cortisone injections do not help, there are different types of shots that may help but they take longer to take effect. It's important that you continue to stay active. Straight leg raises, knee extensions 3 sets of 10 once a day (add ankle weight if these become too easy). Consider physical therapy to strengthen muscles around the joint that hurts to take pressure off of the joint itself. Shoe inserts with good arch support may be helpful. Heat or ice 15 minutes at a time 3-4 times a day as needed to help with pain. Water aerobics and cycling with low resistance are the best two types of exercise for arthritis though any exercise is ok as long as it doesn't worsen the pain. Follow up with me in 6 weeks.  You have patellar tendinitis of your right knee. Ice area 15 minutes at a time 3-4 times a day as needed. Diclofenac as noted above. Straight leg raises, knee extensions, decline squats - work up to 3 sets of 10 once a day. Consider patellar tendon strap when up and walking around. Consider physical therapy. Consider nitro patches if not improving.   Follow up with me in 6 weeks.

## 2018-07-30 ENCOUNTER — Ambulatory Visit (INDEPENDENT_AMBULATORY_CARE_PROVIDER_SITE_OTHER): Payer: 59 | Admitting: Family

## 2018-07-30 ENCOUNTER — Encounter: Payer: Self-pay | Admitting: Family

## 2018-07-30 VITALS — BP 154/96 | HR 104 | Temp 98.6°F | Ht 69.0 in | Wt 308.6 lb

## 2018-07-30 DIAGNOSIS — I10 Essential (primary) hypertension: Secondary | ICD-10-CM | POA: Diagnosis not present

## 2018-07-30 DIAGNOSIS — L723 Sebaceous cyst: Secondary | ICD-10-CM | POA: Diagnosis not present

## 2018-07-30 MED ORDER — LOSARTAN POTASSIUM 50 MG PO TABS: 50.0000 mg | ORAL_TABLET | Freq: Every day | ORAL | 3 refills | Status: DC

## 2018-07-30 MED ORDER — CEPHALEXIN 500 MG PO CAPS: 500.0000 mg | ORAL_CAPSULE | Freq: Three times a day (TID) | ORAL | 0 refills | Status: DC

## 2018-07-30 NOTE — Progress Notes (Signed)
Subjective:    Patient ID: Amy Sutton, female    DOB: 1981/05/17, 37 y.o.   MRN: [id number]  HPI   Patient is a 37 yr old female who presents today for follow up.    HTN- maintained on Toprol xl 150mg  once daily and amlodipine.  BP Readings from Last 3 Encounters:  07/30/18 (!) 154/96  07/16/18 (!) 151/90  06/11/18 (!) 168/84   Notes area on her right breast she would like evaluated.  Notes area is tender.    Review of Systems    see HPI  Past Medical History:  Diagnosis Date  . Hypertension      Social History   Socioeconomic History  . Marital status: Single    Spouse name: Not on file  . Number of children: 1  . Years of education: Not on file  . Highest education level: Not on file  Occupational History    Employer: [employer]  Social Needs  . Financial resource strain: Not on file  . Food insecurity:    Worry: Not on file    Inability: Not on file  . Transportation needs:    Medical: Not on file    Non-medical: Not on file  Tobacco Use  . Smoking status: Never Smoker  . Smokeless tobacco: Never Used  Substance and Sexual Activity  . Alcohol use: Yes    Alcohol/week: 3.0 standard drinks    Types: 3 Standard drinks or equivalent per week  . Drug use: No  . Sexual activity: Not Currently    Birth control/protection: Implant    Comment: 1st intercourse 18 yo-5 partners-Nexplanon inserted  2 -2015  Lifestyle  . Physical activity:    Days per week: Not on file    Minutes per session: Not on file  . Stress: Not on file  Relationships  . Social connections:    Talks on phone: Not on file    Gets together: Not on file    Attends religious service: Not on file    Active member of club or organization: Not on file    Attends meetings of clubs or organizations: Not on file    Relationship status: Not on file  . Intimate partner violence:    Fear of current or ex partner: Not on file    Emotionally abused: Not on file    Physically  abused: Not on file    Forced sexual activity: Not on file  Other Topics Concern  . 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 Nexplanon     Inserted 10-2013    Family History  Problem Relation Age of Onset  . Arthritis Mother   . Hypertension Mother   . Diabetes Mother   . Hyperlipidemia Mother   . Hypertension Father   . Diabetes Father   . Hyperlipidemia Father   . Cancer Maternal Uncle        prostate  . Cancer Paternal Aunt        Lymphoma    No Known Allergies  Current Outpatient Medications on File Prior to Visit  Medication Sig Dispense Refill  . albuterol (PROAIR HFA) 108 (90 Base) MCG/ACT  inhaler Inhale 2 puffs into the lungs every 6 (six) hours as needed for wheezing or shortness of breath. 3 Inhaler 1  . amLODipine (NORVASC) 10 MG tablet Take 1 tablet (10 mg total) by mouth daily. 90 tablet 1  . diclofenac (VOLTAREN) 75 MG EC tablet Take 1 tablet (75 mg total) by mouth 2 (two) times daily. 60 tablet 1  . etonogestrel (NEXPLANON) 68 MG IMPL implant Inject 1 each into the skin once.    . hydrochlorothiazide (HYDRODIURIL) 25 MG tablet Take 1 tablet (25 mg total) by mouth daily. 90 tablet 1  . meloxicam (MOBIC) 7.5 MG tablet TAKE 1 TABLET DAILY. NEED  OFFICE VISIT FOR FURTHER   REFILLS 90 tablet 0  . metoprolol succinate (TOPROL-XL) 100 MG 24 hr tablet Take 1 tablet (100 mg total) by mouth daily. Take with or immediately following a meal. 90 tablet 1  . metoprolol succinate (TOPROL-XL) 50 MG 24 hr tablet TAKE 1 TABLET BY MOUTH DAILY. TAKE WITH OR IMMEDIATELY FOLLOWING A MEAL. 90 tablet 1  . olopatadine (PATANOL) 0.1 % ophthalmic solution Place 1 drop into both eyes 2 (two) times daily.     No current facility-administered medications on file  prior to visit.     BP (!) 154/96 (BP Location: Right Arm, Patient Position: Sitting, Cuff Size: Large)   Pulse (!) 104   Temp 98.6 F (37 C) (Oral)   Ht 5\' 9"  (1.753 m)   Wt (!) 308 lb 9.6 oz (140 kg)   LMP 01/07/2018 (Exact Date)   SpO2 100%   BMI 45.57 kg/m    Objective:   Physical Exam  Constitutional: She is oriented to person, place, and time. She appears well-developed and well-nourished.  Cardiovascular: Normal rate, regular rhythm and normal heart sounds.  No murmur heard. Pulmonary/Chest: Effort normal and breath sounds normal. No respiratory distress. She has no wheezes.  Neurological: She is alert and oriented to person, place, and time.  Skin: Skin is warm and dry.  Psychiatric: She has a normal mood and affect. Her behavior is normal. Judgment and thought content normal.  breast:  Firm tender, mobile pea sized mass noted right breast at 3 oclock, adjacent to sternum.         Assessment & Plan:  Sebaceous cyst-  New. Exam most consistent with sebaceous cyst right breast. Advised keflex and warm compresses. Re-evaluate in 2 weeks. If not improved plan breast imaging.  HTN- uncontrolled. Will add losartan 50mg  once daily.

## 2018-07-30 NOTE — Patient Instructions (Addendum)
Please add losartan 50mg  once daily.  Start keflex for the boil and add keflex (antibiotic) 3 times daily.

## 2018-08-13 ENCOUNTER — Ambulatory Visit: Payer: 59 | Admitting: Family

## 2018-09-03 ENCOUNTER — Ambulatory Visit: Payer: 59 | Admitting: Family Medicine

## 2018-09-14 ENCOUNTER — Other Ambulatory Visit: Payer: Self-pay | Admitting: Family Medicine

## 2018-09-24 ENCOUNTER — Ambulatory Visit: Payer: 59 | Admitting: Family Medicine

## 2018-09-24 DIAGNOSIS — H35033 Hypertensive retinopathy, bilateral: Secondary | ICD-10-CM | POA: Diagnosis not present

## 2018-10-29 ENCOUNTER — Other Ambulatory Visit: Payer: Self-pay | Admitting: Family Medicine

## 2018-12-10 ENCOUNTER — Telehealth: Payer: Self-pay | Admitting: Family

## 2018-12-10 NOTE — Telephone Encounter (Signed)
Please call pt to arrange a virtual visit for follow up of her blood pressure.

## 2018-12-17 NOTE — Telephone Encounter (Signed)
Lvm for patient to be aware we are doing virtual visits and to call us for bp follow up appointment.

## 2018-12-24 ENCOUNTER — Other Ambulatory Visit: Payer: Self-pay | Admitting: Family

## 2018-12-24 NOTE — Telephone Encounter (Signed)
Please advise pt that a 30 day rx has been sent but she needs OV prior to additional refills.

## 2018-12-26 NOTE — Telephone Encounter (Signed)
Patient advised rx was sent and scheduled for Tuesday 01-01-19

## 2019-01-01 ENCOUNTER — Ambulatory Visit (INDEPENDENT_AMBULATORY_CARE_PROVIDER_SITE_OTHER): Payer: 59 | Admitting: Family

## 2019-01-01 DIAGNOSIS — J45909 Unspecified asthma, uncomplicated: Secondary | ICD-10-CM | POA: Diagnosis not present

## 2019-01-01 DIAGNOSIS — J302 Other seasonal allergic rhinitis: Secondary | ICD-10-CM

## 2019-01-01 DIAGNOSIS — I10 Essential (primary) hypertension: Secondary | ICD-10-CM

## 2019-01-01 NOTE — Progress Notes (Signed)
Virtual Visit via Video Note  I connected with Amy Sutton on 01/01/19 at  5:20 PM EDT by a video enabled telemedicine application and verified that I am speaking with the correct person using two identifiers. This visit type was conducted due to national recommendations for restrictions regarding the COVID-19 Pandemic (e.g. social distancing).  This format is felt to be most appropriate for this patient at this time.   I discussed the limitations of evaluation and management by telemedicine and the availability of in person appointments. The patient expressed understanding and agreed to proceed.  Only the patient and myself were on today's video visit. The patient was at home and I was in my office at the time of today's visit.   History of Present Illness:  HTN- bp medications include amlodipine 10mg , toprol xl 150mg , reports that she takes her blood pressure meds regularly. Denies SOB/CP or LE edema. Does not have a blood pressure cuff.   BP Readings from Last 3 Encounters:  07/30/18 (!) 154/96  07/16/18 (!) 151/90  06/11/18 (!) 168/84   Asthma- reports no wheezing. Has not needed albuterol recently.   Reports that she has had some sneezing.     Observations/Objective:  Gen: Awake, alert, no acute distress Resp: Breathing is even and non-labored Psych: calm/pleasant demeanor Neuro: Alert and Oriented x 3, + facial symmetry, speech is clear.    Assessment and Plan:  HTN- states she never received losartan. She plans to purchase an otc blood pressure cuff and send me her reading. If elevated will recommend that she start losartan and plan a nurse visit bp check following initiation of losartan which will include lab work.  Asthma- stable. continue prn albuterol.   Seasonal allergies- stable. Not needing to use patanol. Monitor.   Follow Up Instructions:    I discussed the assessment and treatment plan with the patient. The patient was provided an opportunity to ask  questions and all were answered. The patient agreed with the plan and demonstrated an understanding of the instructions.   The patient was advised to call back or seek an in-person evaluation if the symptoms worsen or if the condition fails to improve as anticipated.    Lemont Fillers, NP

## 2019-01-22 ENCOUNTER — Other Ambulatory Visit: Payer: Self-pay | Admitting: Family

## 2019-01-31 ENCOUNTER — Other Ambulatory Visit: Payer: Self-pay | Admitting: Family Medicine

## 2019-02-17 ENCOUNTER — Other Ambulatory Visit: Payer: Self-pay | Admitting: Family

## 2019-02-18 ENCOUNTER — Other Ambulatory Visit: Payer: Self-pay | Admitting: Family

## 2019-03-10 ENCOUNTER — Other Ambulatory Visit: Payer: Self-pay | Admitting: Family

## 2019-03-17 ENCOUNTER — Other Ambulatory Visit: Payer: Self-pay | Admitting: Family

## 2019-03-19 ENCOUNTER — Telehealth: Payer: Self-pay | Admitting: Family

## 2019-03-19 ENCOUNTER — Other Ambulatory Visit: Payer: Self-pay

## 2019-03-19 ENCOUNTER — Ambulatory Visit (INDEPENDENT_AMBULATORY_CARE_PROVIDER_SITE_OTHER): Payer: 59

## 2019-03-19 VITALS — BP 146/80 | HR 89

## 2019-03-19 DIAGNOSIS — I10 Essential (primary) hypertension: Secondary | ICD-10-CM

## 2019-03-19 MED ORDER — METOPROLOL SUCCINATE ER 50 MG PO TB24: ORAL_TABLET | ORAL | 5 refills | Status: DC

## 2019-03-19 MED ORDER — LOSARTAN POTASSIUM 100 MG PO TABS: 100.0000 mg | ORAL_TABLET | Freq: Every day | ORAL | 3 refills | Status: DC

## 2019-03-19 MED ORDER — METOPROLOL SUCCINATE ER 100 MG PO TB24: 100.0000 mg | ORAL_TABLET | Freq: Every day | ORAL | 5 refills | Status: DC

## 2019-03-19 NOTE — Telephone Encounter (Addendum)
Pt states she usully take metoprolol 50mg  metoprolol 100mg  amlodipine 10mg  and hydrochlorothiazide 25 mg. Pt did not take Metoprolol 100mg  this mornig because she has been out for two days. Pt states she does not take losartan anymore and wants to know if she has to add it back or continue Metoprolol 100mg ?

## 2019-03-19 NOTE — Telephone Encounter (Signed)
Patient scheduled for same day bmet at 10:30 on lab schedule.

## 2019-03-19 NOTE — Telephone Encounter (Signed)
Pt stated also is needing a lab appt after her bp check, there is no orders on file, please advise since pt has another NV on April 17, 2019 and would like lab appt on same day of her NV.

## 2019-03-19 NOTE — Telephone Encounter (Signed)
Verbal order at time of nurse visit was to increase losartan from 50mg  to 100mg . (not to increase metoprolol).  Please contact patient to advise her of this and let her know that I sent 100 mg tabs of losartan to her pharmacy.

## 2019-03-19 NOTE — Addendum Note (Signed)
Addended by: [name] on: 03/19/2019 12:14 PM   Modules accepted: Orders

## 2019-03-19 NOTE — Progress Notes (Cosign Needed Addendum)
  Pt here for Blood pressure check per [name]   Pt currently takes:Metoprolol 50mg   amlodipine 10mg  and hydrochlorothiazide 25 mg   Pt reports compliance with medication.  BP today  = 146/80 HR =89  Pt advised per:  reurn 1 month and bmet when return start metoprolol 100mg    Addendum: correction to above- verbal order was for pt to increase losartan from 50mg  to 100mg .  Not to increase metoprolol.  See phone note.   Nance Pear NP

## 2019-03-19 NOTE — Telephone Encounter (Signed)
Spoke to patient.  She reports that she has been out of her 100 mg dose of metoprolol since Saturday.  She normally takes a total of 150 mg once daily.  She only took 50 mg of metoprolol this morning.  She reports that she never received prescription for losartan therefore did not start.  I advised the patient to restart the metoprolol 100 mg in addition to her current 50 mg.  Keep her upcoming appointment in 1 month.  We will plan to obtain a follow-up basic metabolic panel at that time.  Patient verbalizes understanding.  Losartan prescription was canceled at the med center [facility].

## 2019-04-17 ENCOUNTER — Other Ambulatory Visit: Payer: Self-pay

## 2019-04-17 ENCOUNTER — Other Ambulatory Visit (INDEPENDENT_AMBULATORY_CARE_PROVIDER_SITE_OTHER): Payer: 59

## 2019-04-17 ENCOUNTER — Other Ambulatory Visit: Payer: Self-pay | Admitting: Family

## 2019-04-17 ENCOUNTER — Ambulatory Visit (INDEPENDENT_AMBULATORY_CARE_PROVIDER_SITE_OTHER): Payer: 59 | Admitting: Family

## 2019-04-17 DIAGNOSIS — I10 Essential (primary) hypertension: Secondary | ICD-10-CM

## 2019-04-17 LAB — BASIC METABOLIC PANEL
BUN: 13 mg/dL (ref 6–23)
CO2: 27 mEq/L (ref 19–32)
Calcium: 9.5 mg/dL (ref 8.4–10.5)
Chloride: 105 mEq/L (ref 96–112)
Creatinine, Ser: 0.75 mg/dL (ref 0.40–1.20)
GFR: 105.17 mL/min (ref >60.00)
Glucose, Bld: 93 mg/dL (ref 70–99)
Potassium: 3.8 mEq/L (ref 3.5–5.1)
Sodium: 140 mEq/L (ref 135–145)

## 2019-04-17 MED ORDER — LOSARTAN POTASSIUM 50 MG PO TABS: 50.0000 mg | ORAL_TABLET | Freq: Every day | ORAL | 1 refills | Status: DC

## 2019-04-17 MED FILL — LOSARTAN POTASSIUM 50 MG TA: 50 | 30 days supply | Qty: 30 | Fill #0

## 2019-04-17 NOTE — Progress Notes (Signed)
Pt here for Blood pressure check per [name]   Last blood pressure on 03/19/19 was 146/80 p 89  Pt currently takes:Metoprolol 150mg   amlodipine 10mg  and hydrochlorothiazide 25 mg  Has taken medication today  No missed doses  BP today  =  Right arm 144/84 HR = 85 Left wrist (has nexplanon in arm)  138/84 HR 86  Add losartan 50mg  and see [name] in one month.  Appointment made for 1 month.

## 2019-05-20 ENCOUNTER — Telehealth: Payer: Self-pay | Admitting: Family

## 2019-05-20 ENCOUNTER — Ambulatory Visit: Payer: 59 | Admitting: Family

## 2019-05-20 NOTE — Telephone Encounter (Signed)
Left message requesting that pt reschedule her appointment.

## 2019-05-21 ENCOUNTER — Other Ambulatory Visit: Payer: Self-pay

## 2019-05-21 ENCOUNTER — Ambulatory Visit (INDEPENDENT_AMBULATORY_CARE_PROVIDER_SITE_OTHER): Payer: 59 | Admitting: Family

## 2019-05-21 VITALS — BP 176/99 | HR 87

## 2019-05-21 DIAGNOSIS — I10 Essential (primary) hypertension: Secondary | ICD-10-CM | POA: Diagnosis not present

## 2019-05-21 NOTE — Progress Notes (Signed)
Virtual Visit via Video Note  I connected with Amy Sutton on 05/21/19 at  5:20 PM EDT by a video enabled telemedicine application and verified that I am speaking with the correct person using two identifiers.  Location: Patient: home Provider: work   I discussed the limitations of evaluation and management by telemedicine and the availability of in person appointments. The patient expressed understanding and agreed to proceed.  History of Present Illness:  Patient is a 38 yr old female who presents today for follow up of her blood pressure.  HTN- last visit we added losartan once daily for her blood pressure. She is currently maintained on amlodipine, hctz and metoprolol 100mg + 50mg  once daily. Reports that she ran out of the 50mg  tabs so she did not take them.    BP Readings from Last 3 Encounters:  05/21/19 (!) 176/99  03/19/19 (!) 146/80  07/30/18 (!) 154/96   Past Medical History:  Diagnosis Date  . Hypertension      Social History   Socioeconomic History  . Marital status: Single    Spouse name: Not on file  . Number of children: 1  . Years of education: Not on file  . Highest education level: Not on file  Occupational History    Employer: [employer]  Social Needs  . Financial resource strain: Not on file  . Food insecurity    Worry: Not on file    Inability: Not on file  . Transportation needs    Medical: Not on file    Non-medical: Not on file  Tobacco Use  . Smoking status: Never Smoker  . Smokeless tobacco: Never Used  Substance and Sexual Activity  . Alcohol use: Yes    Alcohol/week: 3.0 standard drinks    Types: 3 Standard drinks or equivalent per week  . Drug use: No  . Sexual activity: Not Currently    Birth control/protection: Implant    Comment: 1st intercourse 18 yo-5 partners-Nexplanon inserted  2 -2015  Lifestyle  . Physical activity    Days per week: Not on file    Minutes per session: Not on file  . Stress: Not on file   Relationships  . Social Herbalist on phone: Not on file    Gets together: Not on file    Attends religious service: Not on file    Active member of club or organization: Not on file    Attends meetings of clubs or organizations: Not on file    Relationship status: Not on file  . Intimate partner violence    Fear of current or ex partner: Not on file    Emotionally abused: Not on file    Physically abused: Not on file    Forced sexual activity: Not on file  Other Topics Concern  . Not on file  Social History Narrative   Regular exercise:  Not currently   Caffeine Use:  Sometimes   5 yr old son   Ship broker at [employer].   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 Nexplanon     Inserted 10-2013    Family History  Problem Relation Age of Onset  . Arthritis Mother   . Hypertension Mother   . Diabetes Mother   . Hyperlipidemia Mother   . Hypertension Father   . Diabetes Father   .  Hyperlipidemia Father   . Cancer Maternal Uncle        prostate  . Cancer Paternal Aunt        Lymphoma    No Known Allergies  Current Outpatient Medications on File Prior to Visit  Medication Sig Dispense Refill  . albuterol (PROAIR HFA) 108 (90 Base) MCG/ACT inhaler Inhale 2 puffs into the lungs every 6 (six) hours as needed for wheezing or shortness of breath. 3 Inhaler 1  . amLODipine (NORVASC) 10 MG tablet TAKE 1 TABLET BY MOUTH EVERY DAY 30 tablet 5  . diclofenac (VOLTAREN) 75 MG EC tablet TAKE 1 TABLET BY MOUTH TWICE A DAY 60 tablet 0  . etonogestrel (NEXPLANON) 68 MG IMPL implant Inject 1 each into the skin once.    . hydrochlorothiazide (HYDRODIURIL) 25 MG tablet TAKE 1 TABLET BY MOUTH EVERY DAY 30 tablet 5  . losartan (COZAAR) 50 MG tablet Take 1 tablet (50 mg total) by mouth daily. 30 tablet 1  . metoprolol succinate (TOPROL XL) 100 MG 24 hr tablet Take 1 tablet (100 mg total) by mouth  daily. Take with or immediately following a meal. 30 tablet 5  . metoprolol succinate (TOPROL-XL) 50 MG 24 hr tablet Take with or immediately following a meal. 30 tablet 5   No current facility-administered medications on file prior to visit.     BP (!) 176/99   Pulse 87       Observations/Objective:   Gen: Awake, alert, no acute distress Resp: Breathing is even and non-labored Psych: calm/pleasant demeanor Neuro: Alert and Oriented x 3, + facial symmetry, speech is clear.   Assessment and Plan:  HTN- bp uncontrolled. Advised pt to take 1/2 of her 100mg  metoprolol now and then pick up the 50mg  rx tomorrow.  She is to check bp once daily for the next 3 days and send me her readings via mychart on Friday.   Follow Up Instructions:    I discussed the assessment and treatment plan with the patient. The patient was provided an opportunity to ask questions and all were answered. The patient agreed with the plan and demonstrated an understanding of the instructions.   The patient was advised to call back or seek an in-person evaluation if the symptoms worsen or if the condition fails to improve as anticipated.  Lemont FillersMelissa S O'Sullivan, NP

## 2019-05-22 ENCOUNTER — Telehealth: Payer: Self-pay | Admitting: Family

## 2019-05-22 NOTE — Telephone Encounter (Signed)
LVM to schedule 4 week f/u appointment from [facility] 05/21/19.

## 2019-05-24 ENCOUNTER — Encounter: Payer: Self-pay | Admitting: Family

## 2019-05-24 MED ORDER — LOSARTAN POTASSIUM 50 MG PO TABS: 75.0000 mg | ORAL_TABLET | Freq: Every day | ORAL | 2 refills | Status: DC

## 2019-05-27 MED FILL — LOSARTAN POTASSIUM 50 MG TA: 50 | 30 days supply | Qty: 45 | Fill #0

## 2019-05-28 ENCOUNTER — Encounter: Payer: Self-pay | Admitting: Gynecology

## 2019-06-11 ENCOUNTER — Other Ambulatory Visit: Payer: Self-pay

## 2019-06-12 ENCOUNTER — Encounter: Payer: Self-pay | Admitting: Family

## 2019-06-12 ENCOUNTER — Ambulatory Visit: Payer: 59 | Admitting: Family

## 2019-06-12 ENCOUNTER — Other Ambulatory Visit: Payer: Self-pay

## 2019-06-12 VITALS — BP 138/72 | HR 86 | Temp 97.9°F | Resp 16 | Ht 70.0 in | Wt 277.0 lb

## 2019-06-12 DIAGNOSIS — I10 Essential (primary) hypertension: Secondary | ICD-10-CM

## 2019-06-12 DIAGNOSIS — M5442 Lumbago with sciatica, left side: Secondary | ICD-10-CM

## 2019-06-12 DIAGNOSIS — G8929 Other chronic pain: Secondary | ICD-10-CM | POA: Diagnosis not present

## 2019-06-12 DIAGNOSIS — Z23 Encounter for immunization: Secondary | ICD-10-CM | POA: Diagnosis not present

## 2019-06-12 MED ORDER — MELOXICAM 7.5 MG PO TABS: 7.5000 mg | ORAL_TABLET | Freq: Every day | ORAL | 0 refills | Status: DC

## 2019-06-12 NOTE — Patient Instructions (Addendum)
Please begin meloxicam once daily for your back pain. Begin back exercises twice daily as below. Keep up the good work with healthy diet, exercise and weight loss.    Back Exercises The following exercises strengthen the muscles that help to support the trunk and back. They also help to keep the lower back flexible. Doing these exercises can help to prevent back pain or lessen existing pain.  If you have back pain or discomfort, try doing these exercises 2-3 times each day or as told by your health care provider.  As your pain improves, do them once each day, but increase the number of times that you repeat the steps for each exercise (do more repetitions).  To prevent the recurrence of back pain, continue to do these exercises once each day or as told by your health care provider. Do exercises exactly as told by your health care provider and adjust them as directed. It is normal to feel mild stretching, pulling, tightness, or discomfort as you do these exercises, but you should stop right away if you feel sudden pain or your pain gets worse. Exercises Single knee to chest Repeat these steps 3-5 times for each leg: 1. Lie on your back on a firm bed or the floor with your legs extended. 2. Bring one knee to your chest. Your other leg should stay extended and in contact with the floor. 3. Hold your knee in place by grabbing your knee or thigh with both hands and hold. 4. Pull on your knee until you feel a gentle stretch in your lower back or buttocks. 5. Hold the stretch for 10-30 seconds. 6. Slowly release and straighten your leg. Pelvic tilt Repeat these steps 5-10 times: 1. Lie on your back on a firm bed or the floor with your legs extended. 2. Bend your knees so they are pointing toward the ceiling and your feet are flat on the floor. 3. Tighten your lower abdominal muscles to press your lower back against the floor. This motion will tilt your pelvis so your tailbone points up toward the  ceiling instead of pointing to your feet or the floor. 4. With gentle tension and even breathing, hold this position for 5-10 seconds. Cat-cow Repeat these steps until your lower back becomes more flexible: 1. Get into a hands-and-knees position on a firm surface. Keep your hands under your shoulders, and keep your knees under your hips. You may place padding under your knees for comfort. 2. Let your head hang down toward your chest. Contract your abdominal muscles and point your tailbone toward the floor so your lower back becomes rounded like the back of a cat. 3. Hold this position for 5 seconds. 4. Slowly lift your head, let your abdominal muscles relax and point your tailbone up toward the ceiling so your back forms a sagging arch like the back of a cow. 5. Hold this position for 5 seconds.  Press-ups Repeat these steps 5-10 times: 1. Lie on your abdomen (face-down) on the floor. 2. Place your palms near your head, about shoulder-width apart. 3. Keeping your back as relaxed as possible and keeping your hips on the floor, slowly straighten your arms to raise the top half of your body and lift your shoulders. Do not use your back muscles to raise your upper torso. You may adjust the placement of your hands to make yourself more comfortable. 4. Hold this position for 5 seconds while you keep your back relaxed. 5. Slowly return to lying flat  on the floor.  Bridges Repeat these steps 10 times: 1. Lie on your back on a firm surface. 2. Bend your knees so they are pointing toward the ceiling and your feet are flat on the floor. Your arms should be flat at your sides, next to your body. 3. Tighten your buttocks muscles and lift your buttocks off the floor until your waist is at almost the same height as your knees. You should feel the muscles working in your buttocks and the back of your thighs. If you do not feel these muscles, slide your feet 1-2 inches farther away from your buttocks. 4. Hold  this position for 3-5 seconds. 5. Slowly lower your hips to the starting position, and allow your buttocks muscles to relax completely. If this exercise is too easy, try doing it with your arms crossed over your chest. Abdominal crunches Repeat these steps 5-10 times: 1. Lie on your back on a firm bed or the floor with your legs extended. 2. Bend your knees so they are pointing toward the ceiling and your feet are flat on the floor. 3. Cross your arms over your chest. 4. Tip your chin slightly toward your chest without bending your neck. 5. Tighten your abdominal muscles and slowly raise your trunk (torso) high enough to lift your shoulder blades a tiny bit off the floor. Avoid raising your torso higher than that because it can put too much stress on your low back and does not help to strengthen your abdominal muscles. 6. Slowly return to your starting position. Back lifts Repeat these steps 5-10 times: 1. Lie on your abdomen (face-down) with your arms at your sides, and rest your forehead on the floor. 2. Tighten the muscles in your legs and your buttocks. 3. Slowly lift your chest off the floor while you keep your hips pressed to the floor. Keep the back of your head in line with the curve in your back. Your eyes should be looking at the floor. 4. Hold this position for 3-5 seconds. 5. Slowly return to your starting position. Contact a health care provider if:  Your back pain or discomfort gets much worse when you do an exercise.  Your worsening back pain or discomfort does not lessen within 2 hours after you exercise. If you have any of these problems, stop doing these exercises right away. Do not do them again unless your health care provider says that you can. Get help right away if:  You develop sudden, severe back pain. If this happens, stop doing the exercises right away. Do not do them again unless your health care provider says that you can. This information is not intended to  replace advice given to you by your health care provider. Make sure you discuss any questions you have with your health care provider. Document Released: 09/29/2004 Document Revised: 12/27/2018 Document Reviewed: 05/24/2018 Elsevier Patient Education  2020 [employer].

## 2019-06-12 NOTE — Progress Notes (Signed)
Subjective:    Patient ID: Amy Sutton, female    DOB: [birth year], 38 y.o.   MRN: [id number]  HPI  Patient is a 38 yr old female who presents today to discuss weight management. She has been working on a [employer] and walking when her schedule allows.    Wt Readings from Last 3 Encounters:  06/12/19 277 lb (125.6 kg)  07/30/18 (!) 308 lb 9.6 oz (140 kg)  07/16/18 (!) 307 lb (139.3 kg)   Back pain- reports low back pain which radiates down the left buttock.  Sometimes it will reach down the left posterior thigh. She has hx of scoliosis.  Reports that pain started months ago. She has tried stretching, tylenol.  Reports that her current pain is 5/10.  Lab Results  Component Value Date   CREATININE 0.75 04/17/2019    Review of Systems  See HPI  Past Medical History:  Diagnosis Date  . Hypertension      Social History   Socioeconomic History  . Marital status: Single    Spouse name: Not on file  . Number of children: 1  . Years of education: Not on file  . Highest education level: Not on file  Occupational History    Employer: [employer]  Social Needs  . Financial resource strain: Not on file  . Food insecurity    Worry: Not on file    Inability: Not on file  . Transportation needs    Medical: Not on file    Non-medical: Not on file  Tobacco Use  . Smoking status: Never Smoker  . Smokeless tobacco: Never Used  Substance and Sexual Activity  . Alcohol use: Yes    Alcohol/week: 3.0 standard drinks    Types: 3 Standard drinks or equivalent per week  . Drug use: No  . Sexual activity: Not Currently    Birth control/protection: Implant    Comment: 1st intercourse 18 yo-5 partners-Nexplanon inserted  2 -2015  Lifestyle  . Physical activity    Days per week: Not on file    Minutes per session: Not on file  . Stress: Not on file  Relationships  . Social Herbalist on phone: Not on file    Gets together: Not on file    Attends  religious service: Not on file    Active member of club or organization: Not on file    Attends meetings of clubs or organizations: Not on file    Relationship status: Not on file  . Intimate partner violence    Fear of current or ex partner: Not on file    Emotionally abused: Not on file    Physically abused: Not on file    Forced sexual activity: Not on file  Other Topics Concern  . Not on file  Social History Narrative   Regular exercise:  Not currently   Caffeine Use:  Sometimes   12 yr old son   Ship broker at [employer].   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 Nexplanon     Inserted 10-2013    Family History  Problem Relation Age of Onset  . Arthritis Mother   . Hypertension Mother   . Diabetes Mother   . Hyperlipidemia Mother   . Hypertension Father   . Diabetes Father   . Hyperlipidemia Father   .  Cancer Maternal Uncle        prostate  . Cancer Paternal Aunt        Lymphoma    No Known Allergies  Current Outpatient Medications on File Prior to Visit  Medication Sig Dispense Refill  . albuterol (PROAIR HFA) 108 (90 Base) MCG/ACT inhaler Inhale 2 puffs into the lungs every 6 (six) hours as needed for wheezing or shortness of breath. 3 Inhaler 1  . amLODipine (NORVASC) 10 MG tablet TAKE 1 TABLET BY MOUTH EVERY DAY 30 tablet 5  . diclofenac (VOLTAREN) 75 MG EC tablet TAKE 1 TABLET BY MOUTH TWICE A DAY 60 tablet 0  . etonogestrel (NEXPLANON) 68 MG IMPL implant Inject 1 each into the skin once.    . hydrochlorothiazide (HYDRODIURIL) 25 MG tablet TAKE 1 TABLET BY MOUTH EVERY DAY 30 tablet 5  . losartan (COZAAR) 50 MG tablet Take 1.5 tablets (75 mg total) by mouth daily. 45 tablet 2  . metoprolol succinate (TOPROL XL) 100 MG 24 hr tablet Take 1 tablet (100 mg total) by mouth daily. Take with or immediately following a meal. 30 tablet 5  . metoprolol succinate (TOPROL-XL) 50 MG 24 hr  tablet Take with or immediately following a meal. 30 tablet 5   No current facility-administered medications on file prior to visit.     BP 138/72 (BP Location: Right Arm, Patient Position: Sitting, Cuff Size: Large)   Pulse 86   Temp 97.9 F (36.6 C) (Temporal)   Resp 16   Ht 5\' 10"  (1.778 m)   Wt 277 lb (125.6 kg)   SpO2 100%   BMI 39.75 kg/m       Objective:   Physical Exam Constitutional:      Appearance: Normal appearance. She is well-developed.  Cardiovascular:     Rate and Rhythm: Normal rate and regular rhythm.     Heart sounds: Normal heart sounds. No murmur.  Pulmonary:     Effort: Pulmonary effort is normal. No respiratory distress.     Breath sounds: Normal breath sounds. No wheezing.  Neurological:     Mental Status: She is alert and oriented to person, place, and time.  Psychiatric:        Behavior: Behavior normal.        Thought Content: Thought content normal.        Judgment: Judgment normal.           Assessment & Plan:  Morbid obesity- commended pt on her weight loss and encouraged her to continue her current diet/exercise and weight loss efforts.  HTN- bp stable following weight loss. Continue current bp meds.  Low back pain with sciatica- trial of meloxicam and back exercises as outlined in AVS.

## 2019-06-18 ENCOUNTER — Ambulatory Visit: Payer: 59 | Admitting: Family

## 2019-07-01 ENCOUNTER — Other Ambulatory Visit: Payer: Self-pay | Admitting: Family

## 2019-07-24 ENCOUNTER — Other Ambulatory Visit: Payer: Self-pay | Admitting: Family

## 2019-07-25 ENCOUNTER — Other Ambulatory Visit: Payer: Self-pay | Admitting: Family

## 2019-07-25 ENCOUNTER — Encounter: Payer: Self-pay | Admitting: Family

## 2019-07-25 ENCOUNTER — Other Ambulatory Visit: Payer: Self-pay

## 2019-07-25 MED ORDER — ALBUTEROL SULFATE HFA 108 (90 BASE) MCG/ACT IN AERS: 2.0000 | INHALATION_SPRAY | Freq: Four times a day (QID) | RESPIRATORY_TRACT | 1 refills | Status: DC | PRN

## 2019-07-29 MED ORDER — MELOXICAM 7.5 MG PO TABS: 7.5000 mg | ORAL_TABLET | Freq: Every day | ORAL | 0 refills | Status: DC

## 2019-08-21 ENCOUNTER — Other Ambulatory Visit: Payer: Self-pay | Admitting: Family

## 2019-08-25 ENCOUNTER — Other Ambulatory Visit: Payer: Self-pay | Admitting: Family

## 2019-09-24 ENCOUNTER — Other Ambulatory Visit: Payer: Self-pay | Admitting: Family

## 2019-10-22 ENCOUNTER — Other Ambulatory Visit: Payer: Self-pay | Admitting: Family

## 2019-10-23 ENCOUNTER — Telehealth: Payer: Self-pay | Admitting: *Deleted

## 2019-10-23 NOTE — Telephone Encounter (Signed)
Patient called c/o irregular bleeding with Nexplanon every other months, lasting 2 weeks. I advised patient schedule office visit with provider, transferred to appointment's.

## 2019-10-29 ENCOUNTER — Other Ambulatory Visit: Payer: Self-pay

## 2019-10-29 ENCOUNTER — Encounter: Payer: Self-pay | Admitting: Obstetrics and Gynecology

## 2019-10-29 ENCOUNTER — Ambulatory Visit: Payer: 59 | Admitting: Obstetrics and Gynecology

## 2019-10-29 VITALS — BP 124/80

## 2019-10-29 DIAGNOSIS — Z3046 Encounter for surveillance of implantable subdermal contraceptive: Secondary | ICD-10-CM | POA: Diagnosis not present

## 2019-10-29 DIAGNOSIS — N939 Abnormal uterine and vaginal bleeding, unspecified: Secondary | ICD-10-CM

## 2019-10-29 DIAGNOSIS — N925 Other specified irregular menstruation: Secondary | ICD-10-CM

## 2019-10-29 MED ORDER — ESTRADIOL 0.5 MG PO TABS: 0.5000 mg | ORAL_TABLET | Freq: Every day | ORAL | 1 refills | Status: DC

## 2019-10-29 NOTE — Patient Instructions (Signed)
When the bleeding is starting to occur, please start with 2-3 tablets of ibuprofen (400-600 mg) every 6 hours (4 times per day) taken with food, for up to 5 days. May cause stomach upset. But can help with reducing the amount of menstrual bleeding. Take the oral estrogen tablet daily for 2 weeks when not having the bleeding. We will see how it is going when we see you for your physical soon.

## 2019-10-29 NOTE — Progress Notes (Signed)
   Amy Sutton  1980-10-14 [id number]  HPI The patient is a 39 y.o. G1P1 who presents today for irregular menstrual bleeding while using the Nexplanon. Nexplanon placed 11/2017.  Initially she was not getting any periods until now.  Bleeding has been 2 weeks for the month, then no bleeding the next month, but sometimes gets the 2 weeks of bleeding monthly.  Bleeding starts as a heavier flow, then gets lighter, sometimes will relapse to heavier.  Not enough to saturate a pad but enough to require use of one.  Bleeding mostly stopped yesterday, just slight brown today.  This is her third Nexplanon and she has been using it more or less continuously for several years.  Past medical history,surgical history, problem list, medications, allergies, family history and social history were all reviewed and documented as reviewed in the EPIC chart.  ROS:  Feeling well. No dyspnea or chest pain on exertion.  No abdominal pain, change in bowel habits, black or bloody stools.  No urinary tract symptoms. GYN ROS: + abnormal bleeding, no pelvic pain or discharge.  Physical Exam  BP 124/80 (Cuff Size: Large)  General: Pleasant female, no acute distress, alert and oriented Upper extremity indicates the Nexplanon is clearly in place in the subcutaneous tissue. Pelvic: BUS normal, vulva her skin without lesions or abnormality, vaginal epithelium normal, no abnormal discharge, scant dark menses present in the vaginal vault, cervix is grossly normal without lesions or polyp.  Kennon Portela present for the examination   Assessment 39 yo G1P1 with abnormal uterine bleeding with Nexplanon in place  Plan We discussed that continued use of Nexplanon can result in abnormal uterine bleeding patterns and is actually long the top reasons why women choose to discontinue this contraceptive method.  As she has been using the progestin only birth control for a long time now, we discussed that endometrial atrophy can lead to  fairly unpredictable bleeding patterns.  I suggested that she try scheduled ibuprofen therapy 400 to 600 mg every 6 hours for up to 5 days once her menstrual bleeding starts as this can reduce the amount of bleeding.    To help supplement the endometrium, she also could try oral Estrace for a short limited duration.  She denies any history of blood clots, DVT, stroke, or thrombotic diseases.  We discussed that use of estrogen can cause side effects including these risks.  She is a non-smoker.  She would be willing to try this hormonal supplement so I did give her a 2-week supply for her to try and we will see if this helps her.  She can also do the ibuprofen therapy at the same time.  She will be following up with an annual exam in the near future so we will see how she is doing then.  All questions were answered by the end of the visit.  Theresia Majors MD 10/29/19

## 2019-11-17 ENCOUNTER — Other Ambulatory Visit: Payer: Self-pay | Admitting: Family

## 2019-11-18 ENCOUNTER — Other Ambulatory Visit: Payer: Self-pay

## 2019-11-18 ENCOUNTER — Ambulatory Visit (INDEPENDENT_AMBULATORY_CARE_PROVIDER_SITE_OTHER): Payer: 59 | Admitting: Obstetrics and Gynecology

## 2019-11-18 ENCOUNTER — Encounter: Payer: Self-pay | Admitting: Obstetrics and Gynecology

## 2019-11-18 VITALS — BP 126/84 | Ht 68.0 in | Wt 273.0 lb

## 2019-11-18 DIAGNOSIS — Z01419 Encounter for gynecological examination (general) (routine) without abnormal findings: Secondary | ICD-10-CM

## 2019-11-18 DIAGNOSIS — N939 Abnormal uterine and vaginal bleeding, unspecified: Secondary | ICD-10-CM | POA: Diagnosis not present

## 2019-11-18 MED ORDER — NORGESTIMATE-ETH ESTRADIOL 0.25-35 MG-MCG PO TABS: 1.0000 | ORAL_TABLET | Freq: Every day | ORAL | 3 refills | Status: DC

## 2019-11-18 NOTE — Progress Notes (Signed)
Amy Sutton [birth year] [id number]  SUBJECTIVE:  39 y.o. G1P1 female for annual routine gynecologic exam.  She was in the other week for irregular bleeding on Nexplanon and given a course of oral estrogen, which did stop the bleeding with minimal side effects, but after stopping, the bleeding did start again.  She is now starting a period.   Current Outpatient Medications  Medication Sig Dispense Refill  . albuterol (PROAIR HFA) 108 (90 Base) MCG/ACT inhaler Inhale 2 puffs into the lungs every 6 (six) hours as needed for wheezing or shortness of breath. 8 g 1  . amLODipine (NORVASC) 10 MG tablet TAKE 1 TABLET BY MOUTH EVERY DAY 90 tablet 1  . etonogestrel (NEXPLANON) 68 MG IMPL implant Inject 1 each into the skin once.    . hydrochlorothiazide (HYDRODIURIL) 25 MG tablet TAKE 1 TABLET BY MOUTH EVERY DAY 90 tablet 1  . losartan (COZAAR) 50 MG tablet TAKE 1 AND 1/2 TABLETS BY MOUTH EVERY DAY 45 tablet 1  . meloxicam (MOBIC) 7.5 MG tablet Take 1 tablet (7.5 mg total) by mouth daily. 14 tablet 0  . metoprolol succinate (TOPROL-XL) 100 MG 24 hr tablet TAKE 1 TABLET (100 MG TOTAL) BY MOUTH DAILY. TAKE WITH OR IMMEDIATELY FOLLOWING A MEAL. 30 tablet 2  . metoprolol succinate (TOPROL-XL) 50 MG 24 hr tablet Take with or immediately following a meal. 30 tablet 5  . diclofenac (VOLTAREN) 75 MG EC tablet TAKE 1 TABLET BY MOUTH TWICE A DAY (Patient not taking: Reported on 10/29/2019) 60 tablet 0  . estradiol (ESTRACE) 0.5 MG tablet Take 1 tablet (0.5 mg total) by mouth daily for 14 days. Take when not having bleeding. Repeat the following month if still having irregular bleeding. 14 tablet 1   No current facility-administered medications for this visit.   Allergies: Patient has no known allergies.  Patient's last menstrual period was 11/17/2019.  Past medical history,surgical history, problem list, medications, allergies, family history and social history were all reviewed and documented as reviewed  in the EPIC chart.  ROS:  Feeling well. No dyspnea or chest pain on exertion.  No abdominal pain, change in bowel habits, black or bloody stools.  No urinary tract symptoms. GYN ROS: + abnormal bleeding (on Nexplanon), pelvic pain or discharge, no breast pain or new or enlarging lumps on self exam. No neurological complaints.   OBJECTIVE:  BP 126/84 (Cuff Size: Large)   Ht 5\' 8"  (1.727 m)   Wt 273 lb (123.8 kg)   LMP 11/17/2019   BMI 41.51 kg/m  The patient appears well, alert, oriented x 3, in no distress. ENT normal.  Neck supple. No cervical or supraclavicular adenopathy or thyromegaly.  Lungs are clear, good air entry, no wheezes, rhonchi or rales. S1 and S2 normal, no murmurs, regular rate and rhythm.  Abdomen soft without tenderness, guarding, mass or organomegaly.  Neurological is normal, no focal findings.  BREAST EXAM: breasts appear normal, no suspicious masses, no skin or nipple changes or axillary nodes  PELVIC EXAM: VULVA: normal appearing vulva with no masses, tenderness or lesions, VAGINA: normal appearing vagina with normal color and discharge, no lesions, CERVIX: normal appearing cervix without discharge or lesions, UTERUS: uterus is normal size, shape, consistency and nontender, ADNEXA: normal adnexa in size, nontender and no masses  Chaperone: 11/19/2019 present during the examination  ASSESSMENT:  39 y.o. G1P1 here for annual gynecologic exam  PLAN:   1. No menstrual or hormonal concerns.  2. Pap smear/HPV 06/2017.  Next Pap smear due 2023 following the current 5-year screening guidelines for cotesting. 3. Contraception.  Nexplanon placed 11/2017.  She is having irregular menses and we discussed options including another trial of oral estrogen versus going on a hormonal OCP.  As reviewed last time, she has no medical contraindication to use of OCPs.  I will prescribe her Sprintec she will start this next Sunday as she was just starting her menstrual period  yesterday. 4. Normal breast exam.  Encouraged breast self awareness and notify us of any concerning changes. 5. Health maintenance.  Routine screening blood work done with her primary care provider who she will be seeing soon.  Return annually or sooner, prn.  Joseph Pierini MD  11/18/19

## 2019-11-18 NOTE — Patient Instructions (Signed)
You can start your birth control pill on Sunday, 21 March.  This should help control the irregular bleeding you have been having on the Nexplanon.  If it does not, please let us know.

## 2019-11-25 ENCOUNTER — Other Ambulatory Visit: Payer: Self-pay | Admitting: Family

## 2019-12-19 ENCOUNTER — Other Ambulatory Visit: Payer: Self-pay | Admitting: Family

## 2020-01-13 ENCOUNTER — Other Ambulatory Visit: Payer: Self-pay | Admitting: Family

## 2020-02-09 ENCOUNTER — Other Ambulatory Visit: Payer: Self-pay | Admitting: Family

## 2020-02-10 NOTE — Telephone Encounter (Signed)
Please contact pt to schedule follow up.  

## 2020-02-11 NOTE — Telephone Encounter (Signed)
Called patient let detailed messaged.

## 2020-03-14 ENCOUNTER — Telehealth: Payer: Self-pay | Admitting: Family

## 2020-03-16 NOTE — Telephone Encounter (Signed)
Please contact pt to schedule follow up appointment.  °

## 2020-03-25 ENCOUNTER — Ambulatory Visit: Payer: 59 | Admitting: Family

## 2020-03-25 ENCOUNTER — Encounter: Payer: Self-pay | Admitting: Family

## 2020-03-25 ENCOUNTER — Other Ambulatory Visit: Payer: Self-pay

## 2020-03-25 VITALS — BP 134/77 | HR 89 | Temp 99.0°F | Resp 16 | Ht 66.0 in | Wt 283.0 lb

## 2020-03-25 DIAGNOSIS — J45909 Unspecified asthma, uncomplicated: Secondary | ICD-10-CM

## 2020-03-25 DIAGNOSIS — I10 Essential (primary) hypertension: Secondary | ICD-10-CM | POA: Diagnosis not present

## 2020-03-25 DIAGNOSIS — R635 Abnormal weight gain: Secondary | ICD-10-CM

## 2020-03-25 LAB — BASIC METABOLIC PANEL
BUN: 10 mg/dL (ref 6–23)
CO2: 27 mEq/L (ref 19–32)
Calcium: 9.4 mg/dL (ref 8.4–10.5)
Chloride: 104 mEq/L (ref 96–112)
Creatinine, Ser: 0.73 mg/dL (ref 0.40–1.20)
GFR: 107.96 mL/min (ref >60.00)
Glucose, Bld: 109 mg/dL — ABNORMAL HIGH (ref 70–99)
Potassium: 4 mEq/L (ref 3.5–5.1)
Sodium: 139 mEq/L (ref 135–145)

## 2020-03-25 LAB — TSH: TSH: 2.4 u[IU]/mL (ref 0.35–4.50)

## 2020-03-25 MED ORDER — METOPROLOL SUCCINATE ER 100 MG PO TB24: 100.0000 mg | ORAL_TABLET | Freq: Every day | ORAL | 1 refills | Status: DC

## 2020-03-25 MED ORDER — HYDROCHLOROTHIAZIDE 25 MG PO TABS: 25.0000 mg | ORAL_TABLET | Freq: Every day | ORAL | 1 refills | Status: DC

## 2020-03-25 MED ORDER — AMLODIPINE BESYLATE 10 MG PO TABS: 10.0000 mg | ORAL_TABLET | Freq: Every day | ORAL | 1 refills | Status: DC

## 2020-03-25 NOTE — Progress Notes (Signed)
Subjective:    Patient ID: Amy Sutton, female    DOB: 1981/02/24, 39 y.o.   MRN: [id number]  HPI  Patient is a 39 yr old female who presents today for follow up.  HTN- maintained on amlodipine, and metoprolol 150mg , hctz. Stopped losartan because she felt dizzy.  BP Readings from Last 3 Encounters:  03/25/20 134/77  11/18/19 126/84  10/29/19 124/80   Asthma- maintained on albuterol, has not needed recently  Has completed covid series. Didn't bring her immunization card  Review of Systems See HPI  Past Medical History:  Diagnosis Date  . Hypertension      Social History   Socioeconomic History  . Marital status: Single    Spouse name: Not on file  . Number of children: 1  . Years of education: Not on file  . Highest education level: Not on file  Occupational History    Employer: [employer]  Tobacco Use  . Smoking status: Never Smoker  . Smokeless tobacco: Never Used  Vaping Use  . Vaping Use: Never used  Substance and Sexual Activity  . Alcohol use: Yes    Alcohol/week: 3.0 standard drinks    Types: 3 Standard drinks or equivalent per week  . Drug use: No  . Sexual activity: Not Currently    Birth control/protection: Implant    Comment: 1st intercourse 34 yo-5 partners- Nexplanon inserted 3 -2019  Other Topics Concern  . Not on file  Social History Narrative   Regular exercise:  Not currently   Caffeine Use:  Sometimes   45 yr old son   9 at Consulting civil engineer.   Lives with parents and son   Works as a [employer] Strain:   . Difficulty of Paying Living Expenses:   Food Insecurity:   . Worried About Health visitor in the Last Year:   . Programme researcher, broadcasting/film/video in the Last Year:   Transportation Needs:   . Barista (Medical):   Freight forwarder Lack of Transportation (Non-Medical):   Physical Activity:   . Days of Exercise per Week:   . Minutes of Exercise per  Session:   Stress:   . Feeling of Stress :   Social Connections:   . Frequency of Communication with Friends and Family:   . Frequency of Social Gatherings with Friends and Family:   . Attends Religious Services:   . Active Member of Clubs or Organizations:   . Attends [employer] Meetings:   Banker Marital Status:   Intimate Partner Violence:   . Fear of Current or Ex-Partner:   . Emotionally Abused:   [employer] Physically Abused:   . Sexually Abused:     Past Surgical History:  Procedure Laterality Date  . Nexplanon     Inserted 11-2017    Family History  Problem Relation Age of Onset  . Arthritis Mother   . Hypertension Mother   . Diabetes Mother   . Hyperlipidemia Mother   . Hypertension Father   . Diabetes Father   . Hyperlipidemia Father   . Cancer Maternal Uncle        prostate  . Cancer Paternal Aunt        Lymphoma    No Known Allergies  Current Outpatient Medications on File Prior to Visit  Medication Sig Dispense Refill  . amLODipine (NORVASC) 10 MG tablet TAKE 1 TABLET  BY MOUTH EVERY DAY 30 tablet 0  . diclofenac (VOLTAREN) 75 MG EC tablet TAKE 1 TABLET BY MOUTH TWICE A DAY 60 tablet 0  . etonogestrel (NEXPLANON) 68 MG IMPL implant Inject 1 each into the skin once.    . hydrochlorothiazide (HYDRODIURIL) 25 MG tablet TAKE 1 TABLET BY MOUTH EVERY DAY 30 tablet 0  . meloxicam (MOBIC) 7.5 MG tablet Take 1 tablet (7.5 mg total) by mouth daily. 14 tablet 0  . metoprolol succinate (TOPROL-XL) 100 MG 24 hr tablet TAKE 1 TABLET (100 MG TOTAL) BY MOUTH DAILY. TAKE WITH OR IMMEDIATELY FOLLOWING A MEAL. 30 tablet 0  . metoprolol succinate (TOPROL-XL) 50 MG 24 hr tablet TAKE WITH OR IMMEDIATELY FOLLOWING A MEAL. 90 tablet 1  . norgestimate-ethinyl estradiol (ORTHO-CYCLEN) 0.25-35 MG-MCG tablet Take 1 tablet by mouth daily. 3 Package 3  . VENTOLIN HFA 108 (90 Base) MCG/ACT inhaler Please specify directions, refills and quantity 6.7 g 3  . estradiol (ESTRACE) 0.5 MG tablet  Take 1 tablet (0.5 mg total) by mouth daily for 14 days. Take when not having bleeding. Repeat the following month if still having irregular bleeding. 14 tablet 1  . losartan (COZAAR) 50 MG tablet TAKE 1.5 TABLETS BY MOUTH EVERY DAY (Patient not taking: Reported on 03/25/2020) 45 tablet 0   No current facility-administered medications on file prior to visit.    BP 134/77 (BP Location: Right Arm, Patient Position: Sitting, Cuff Size: Large)   Pulse 89   Temp 99 F (37.2 C) (Oral)   Resp 16   Ht 5\' 6"  (1.676 m)   Wt 283 lb (128.4 kg)   SpO2 100%   BMI 45.68 kg/m       Objective:   Physical Exam Constitutional:      Appearance: She is well-developed.  Cardiovascular:     Rate and Rhythm: Normal rate and regular rhythm.     Heart sounds: Normal heart sounds. No murmur heard.   Pulmonary:     Effort: Pulmonary effort is normal. No respiratory distress.     Breath sounds: Normal breath sounds. No wheezing.  Psychiatric:        Behavior: Behavior normal.        Thought Content: Thought content normal.        Judgment: Judgment normal.                Assessment & Plan:   HTN- bp stable without losartan. Continue other meds. Obtain follow up bmet.  Asthma- stable, continue prn albuterol.  Weight gain- new. pt attributes to [facility] which is being prescribed by her GYN.  We discussed importance of regular exercise.   Wt Readings from Last 3 Encounters:  03/25/20 283 lb (128.4 kg)  11/18/19 273 lb (123.8 kg)  06/12/19 277 lb (125.6 kg)     This visit occurred during the SARS-CoV-2 public health emergency.  Safety protocols were in place, including screening questions prior to the visit, additional usage of staff PPE, and extensive cleaning of exam room while observing appropriate contact time as indicated for disinfecting solutions.         Assessment & Plan:

## 2020-05-05 ENCOUNTER — Encounter: Payer: Self-pay | Admitting: Family

## 2020-05-05 ENCOUNTER — Ambulatory Visit (INDEPENDENT_AMBULATORY_CARE_PROVIDER_SITE_OTHER): Payer: 59 | Admitting: Family

## 2020-05-05 ENCOUNTER — Other Ambulatory Visit: Payer: Self-pay

## 2020-05-05 VITALS — BP 129/75 | HR 83 | Temp 99.2°F | Resp 16 | Ht 69.0 in | Wt 283.8 lb

## 2020-05-05 DIAGNOSIS — Z0000 Encounter for general adult medical examination without abnormal findings: Secondary | ICD-10-CM | POA: Diagnosis not present

## 2020-05-05 DIAGNOSIS — E781 Pure hyperglyceridemia: Secondary | ICD-10-CM

## 2020-05-05 DIAGNOSIS — Z1159 Encounter for screening for other viral diseases: Secondary | ICD-10-CM

## 2020-05-05 NOTE — Patient Instructions (Signed)
Please complete lab work prior to leaving. Continue to work on healthy diet exercise, weight loss.  

## 2020-05-05 NOTE — Progress Notes (Signed)
Subjective:    Patient ID: Amy Sutton, female    DOB: 10-14-1980, 39 y.o.   MRN: [id number]  HPI  Patient presents today for complete physical.  Immunizations: pfizer up to date, Td up to date, flu shot today Diet: feels like she is choosing the wrong foods.   Wt Readings from Last 3 Encounters:  05/05/20 283 lb 12.8 oz (128.7 kg)  03/25/20 283 lb (128.4 kg)  11/18/19 273 lb (123.8 kg)  Exercise:  Not exercising, son has been keeping her busy.   Pap Smear: due 07/17/20 Vision: earlier this year Dental: up to dateReview of Systems  Constitutional: Negative for unexpected weight change.  HENT: Negative for hearing loss and rhinorrhea.   Eyes: Negative for visual disturbance.  Respiratory: Negative for cough and shortness of breath.   Cardiovascular: Negative for chest pain.  Gastrointestinal: Negative for blood in stool, constipation and diarrhea.  Genitourinary: Negative for hematuria and menstrual problem.  Musculoskeletal: Negative for arthralgias and myalgias.  Skin: Negative for rash.  Neurological: Negative for headaches.  Hematological: Negative for adenopathy.  Psychiatric/Behavioral:       Denies depression/anxiety       Past Medical History:  Diagnosis Date  . Hypertension      Social History   Socioeconomic History  . Marital status: Single    Spouse name: Not on file  . Number of children: 1  . Years of education: Not on file  . Highest education level: Not on file  Occupational History    Employer: [employer]  Tobacco Use  . Smoking status: Never Smoker  . Smokeless tobacco: Never Used  Vaping Use  . Vaping Use: Never used  Substance and Sexual Activity  . Alcohol use: Yes    Alcohol/week: 3.0 standard drinks    Types: 3 Standard drinks or equivalent per week  . Drug use: No  . Sexual activity: Not Currently    Birth control/protection: Implant    Comment: 1st intercourse 29 yo-5 partners- Nexplanon inserted 3 -2019  Other  Topics Concern  . Not on file  Social History Narrative   Regular exercise:  Not currently   Caffeine Use:  Sometimes   41 yr old son   Consulting civil engineer at [employer].   Lives with parents and son   Works as a Health visitor Strain:   . Difficulty of Paying Living Expenses: Not on file  Food Insecurity:   . Worried About Programme researcher, broadcasting/film/video in the Last Year: Not on file  . Ran Out of Food in the Last Year: Not on file  Transportation Needs:   . Lack of Transportation (Medical): Not on file  . Lack of Transportation (Non-Medical): Not on file  Physical Activity:   . Days of Exercise per Week: Not on file  . Minutes of Exercise per Session: Not on file  Stress:   . Feeling of Stress : Not on file  Social Connections:   . Frequency of Communication with Friends and Family: Not on file  . Frequency of Social Gatherings with Friends and Family: Not on file  . Attends Religious Services: Not on file  . Active Member of Clubs or Organizations: Not on file  . Attends Banker Meetings: Not on file  . Marital Status: Not on file  Intimate Partner Violence:   . Fear of Current or Ex-Partner: Not on file  .  Emotionally Abused: Not on file  . Physically Abused: Not on file  . Sexually Abused: Not on file    Past Surgical History:  Procedure Laterality Date  . Nexplanon     Inserted 11-2017    Family History  Problem Relation Age of Onset  . Arthritis Mother   . Hypertension Mother   . Diabetes Mother   . Hyperlipidemia Mother   . Hypertension Father   . Diabetes Father   . Hyperlipidemia Father   . Cancer Maternal Uncle        prostate  . Cancer Paternal Aunt        Lymphoma    No Known Allergies  Current Outpatient Medications on File Prior to Visit  Medication Sig Dispense Refill  . amLODipine (NORVASC) 10 MG tablet Take 1 tablet (10 mg total) by mouth daily. 90 tablet 1  . etonogestrel  (NEXPLANON) 68 MG IMPL implant Inject 1 each into the skin once.    . hydrochlorothiazide (HYDRODIURIL) 25 MG tablet Take 1 tablet (25 mg total) by mouth daily. 90 tablet 1  . metoprolol succinate (TOPROL-XL) 100 MG 24 hr tablet Take 1 tablet (100 mg total) by mouth daily. Take with or immediately following a meal. 90 tablet 1  . metoprolol succinate (TOPROL-XL) 50 MG 24 hr tablet TAKE WITH OR IMMEDIATELY FOLLOWING A MEAL. 90 tablet 1  . norgestimate-ethinyl estradiol (ORTHO-CYCLEN) 0.25-35 MG-MCG tablet Take 1 tablet by mouth daily. 3 Package 3  . VENTOLIN HFA 108 (90 Base) MCG/ACT inhaler Please specify directions, refills and quantity 6.7 g 3   No current facility-administered medications on file prior to visit.    BP 129/75 (BP Location: Right Arm, Patient Position: Sitting, Cuff Size: Large)   Pulse 83   Temp 99.2 F (37.3 C) (Oral)   Resp 16   Ht 5\' 9"  (1.753 m)   Wt 283 lb 12.8 oz (128.7 kg)   SpO2 100%   BMI 41.91 kg/m    Objective:   Physical Exam  Physical Exam  Constitutional: She is oriented to person, place, and time. She appears well-developed and well-nourished. No distress.  HENT:  Head: Normocephalic and atraumatic.  Right Ear: Tympanic membrane and ear canal normal.  Left Ear: Tympanic membrane and ear canal normal.  Mouth/Throat: Not examined- pt wearing mask Eyes: Pupils are equal, round, and reactive to light. No scleral icterus.  Neck: Normal range of motion. No thyromegaly present.  Cardiovascular: Normal rate and regular rhythm.   No murmur heard. Pulmonary/Chest: Effort normal and breath sounds normal. No respiratory distress. He has no wheezes. She has no rales. She exhibits no tenderness.  Abdominal: Soft. Bowel sounds are normal. She exhibits no distension and no mass. There is no tenderness. There is no rebound and no guarding.  Musculoskeletal: She exhibits no edema.  Lymphadenopathy:    She has no cervical adenopathy.  Neurological: She is alert  and oriented to person, place, and time. She has normal patellar reflexes. She exhibits normal muscle tone. Coordination normal.  Skin: Skin is warm and dry.  Psychiatric: She has a normal mood and affect. Her behavior is normal. Judgment and thought content normal.  Breasts: Examined lying Right: Without masses, retractions, discharge or axillary adenopathy.  Left: Without masses, retractions, discharge or axillary adenopathy.  Deferred          Assessment & Plan:   Preventative care- discussed healthy diet, exercise, weight loss. She declines pap today. Flu shot today. Tetanus and covid vaccinations up  to date.  Obtain routine lab work.    This visit occurred during the SARS-CoV-2 public health emergency.  Safety protocols were in place, including screening questions prior to the visit, additional usage of staff PPE, and extensive cleaning of exam room while observing appropriate contact time as indicated for disinfecting solutions.         Assessment & Plan:

## 2020-05-06 LAB — CBC WITH DIFFERENTIAL/PLATELET
Absolute Monocytes: 331 cells/uL (ref 200–950)
Basophils Absolute: 17 cells/uL (ref 0–200)
Basophils Relative: 0.3 %
Eosinophils Absolute: 29 cells/uL (ref 15–500)
Eosinophils Relative: 0.5 %
HCT: 43.6 % (ref 35.0–45.0)
Hemoglobin: 14.4 g/dL (ref 11.7–15.5)
Lymphs Abs: 2181 cells/uL (ref 850–3900)
MCH: 27.3 pg (ref 27.0–33.0)
MCHC: 33 g/dL (ref 32.0–36.0)
MCV: 82.6 fL (ref 80.0–100.0)
MPV: 11.5 fL (ref 7.5–12.5)
Monocytes Relative: 5.7 %
Neutro Abs: 3242 cells/uL (ref 1500–7800)
Neutrophils Relative %: 55.9 %
Platelets: 303 10*3/uL (ref 140–400)
RBC: 5.28 10*6/uL — ABNORMAL HIGH (ref 3.80–5.10)
RDW: 12.7 % (ref 11.0–15.0)
Total Lymphocyte: 37.6 %
WBC: 5.8 10*3/uL (ref 3.8–10.8)

## 2020-05-06 LAB — LIPID PANEL
Cholesterol: 173 mg/dL (ref <200)
HDL: 48 mg/dL — ABNORMAL LOW (ref >=50)
LDL Cholesterol (Calc): 105 mg/dL (calc) — ABNORMAL HIGH
Non-HDL Cholesterol (Calc): 125 mg/dL (calc) (ref <130)
Total CHOL/HDL Ratio: 3.6 (calc) (ref <5.0)
Triglycerides: 102 mg/dL (ref <150)

## 2020-05-06 LAB — HEPATIC FUNCTION PANEL
AG Ratio: 1.5 (calc) (ref 1.0–2.5)
ALT: 17 U/L (ref 6–29)
AST: 11 U/L (ref 10–30)
Albumin: 4.2 g/dL (ref 3.6–5.1)
Alkaline phosphatase (APISO): 53 U/L (ref 31–125)
Bilirubin, Direct: 0.1 mg/dL (ref 0.0–0.2)
Globulin: 2.8 g/dL (calc) (ref 1.9–3.7)
Indirect Bilirubin: 0.3 mg/dL (calc) (ref 0.2–1.2)
Total Bilirubin: 0.4 mg/dL (ref 0.2–1.2)
Total Protein: 7 g/dL (ref 6.1–8.1)

## 2020-05-06 LAB — HEPATITIS C ANTIBODY
Hepatitis C Ab: NONREACTIVE
SIGNAL TO CUT-OFF: 0.01 (ref <1.00)

## 2020-06-05 ENCOUNTER — Other Ambulatory Visit: Payer: Self-pay | Admitting: Family

## 2020-06-05 NOTE — Telephone Encounter (Signed)
[name]- please advise what dose Pt should be taking for metoprolol XL, she has both 50mg  and 100mg  on med list or does she take both? Thank you.

## 2020-08-04 ENCOUNTER — Ambulatory Visit: Payer: 59 | Admitting: Family

## 2020-08-14 ENCOUNTER — Ambulatory Visit: Payer: 59 | Admitting: Family

## 2020-09-21 ENCOUNTER — Ambulatory Visit: Payer: 59 | Admitting: Family

## 2020-10-15 ENCOUNTER — Other Ambulatory Visit: Payer: Self-pay | Admitting: Family

## 2020-10-15 ENCOUNTER — Other Ambulatory Visit: Payer: Self-pay | Admitting: Obstetrics and Gynecology

## 2020-10-15 NOTE — Telephone Encounter (Signed)
Annual exam scheduled on 11/19/20

## 2020-11-06 ENCOUNTER — Other Ambulatory Visit: Payer: Self-pay | Admitting: Obstetrics & Gynecology

## 2020-11-08 ENCOUNTER — Telehealth: Payer: Self-pay | Admitting: Family

## 2020-11-10 MED ORDER — METOPROLOL SUCCINATE ER 100 MG PO TB24: 100.0000 mg | ORAL_TABLET | Freq: Every day | ORAL | 0 refills | Status: DC

## 2020-11-10 NOTE — Telephone Encounter (Signed)
Refills sent. Please contact pt to schedule follow up.

## 2020-11-10 NOTE — Telephone Encounter (Signed)
lvm to schduled appt

## 2020-11-19 ENCOUNTER — Other Ambulatory Visit: Payer: Self-pay

## 2020-11-19 ENCOUNTER — Encounter: Payer: Self-pay | Admitting: Obstetrics & Gynecology

## 2020-11-19 ENCOUNTER — Ambulatory Visit (INDEPENDENT_AMBULATORY_CARE_PROVIDER_SITE_OTHER): Payer: 59 | Admitting: Obstetrics & Gynecology

## 2020-11-19 VITALS — BP 140/86 | Ht 68.0 in | Wt 286.0 lb

## 2020-11-19 DIAGNOSIS — Z01419 Encounter for gynecological examination (general) (routine) without abnormal findings: Secondary | ICD-10-CM | POA: Diagnosis not present

## 2020-11-19 DIAGNOSIS — Z30013 Encounter for initial prescription of injectable contraceptive: Secondary | ICD-10-CM

## 2020-11-19 DIAGNOSIS — Z3046 Encounter for surveillance of implantable subdermal contraceptive: Secondary | ICD-10-CM

## 2020-11-19 MED ORDER — MEDROXYPROGESTERONE ACETATE 150 MG/ML IM SUSP
150.0000 mg | Freq: Once | INTRAMUSCULAR | Status: AC
  Administered 2020-11-19: 150 mg via INTRAMUSCULAR

## 2020-11-19 NOTE — Progress Notes (Signed)
Amy Sutton 1980/11/16 [id number]   History:    40 y.o. G1P1L1 Single  RP:  Established patient presenting for annual gyn exam and Nexplanon removal  HPI: BTB on Nexplanon with back up BCPs.  Desires removal of Nexplanon and switch to Depo Provera which controled her cycle well in the past.  No pelvic pain.  Currently abstinent.  Breasts normal.  BMI 43.49.  Planning to improve diet and exercise more.  Health labs with Fam MD.  Past medical history,surgical history, family history and social history were all reviewed and documented in the EPIC chart.  Gynecologic History No LMP recorded. Patient has had an implant.  Obstetric History OB History  Gravida Para Term Preterm AB Living  1 1       1   SAB IAB Ectopic Multiple Live Births               # Outcome Date GA Lbr Len/2nd Weight Sex Delivery Anes PTL Lv  1 Para              ROS: A ROS was performed and pertinent positives and negatives are included in the history.  GENERAL: No fevers or chills. HEENT: No change in vision, no earache, sore throat or sinus congestion. NECK: No pain or stiffness. CARDIOVASCULAR: No chest pain or pressure. No palpitations. PULMONARY: No shortness of breath, cough or wheeze. GASTROINTESTINAL: No abdominal pain, nausea, vomiting or diarrhea, melena or bright red blood per rectum. GENITOURINARY: No urinary frequency, urgency, hesitancy or dysuria. MUSCULOSKELETAL: No joint or muscle pain, no back pain, no recent trauma. DERMATOLOGIC: No rash, no itching, no lesions. ENDOCRINE: No polyuria, polydipsia, no heat or cold intolerance. No recent change in weight. HEMATOLOGICAL: No anemia or easy bruising or bleeding. NEUROLOGIC: No headache, seizures, numbness, tingling or weakness. PSYCHIATRIC: No depression, no loss of interest in normal activity or change in sleep pattern.     Exam:   BP 140/86   Ht 5\' 8"  (1.727 m)   Wt 286 lb (129.7 kg)   BMI 43.49 kg/m   Body mass index is 43.49  kg/m.  General appearance : Well developed well nourished female. No acute distress HEENT: Eyes: no retinal hemorrhage or exudates,  Neck supple, trachea midline, no carotid bruits, no thyroidmegaly Lungs: Clear to auscultation, no rhonchi or wheezes, or rib retractions  Heart: Regular rate and rhythm, no murmurs or gallops Breast:Examined in sitting and supine position were symmetrical in appearance, no palpable masses or tenderness,  no skin retraction, no nipple inversion, no nipple discharge, no skin discoloration, no axillary or supraclavicular lymphadenopathy Abdomen: no palpable masses or tenderness, no rebound or guarding Extremities: no edema or skin discoloration or tenderness  Pelvic: Vulva: Normal             Vagina: No gross lesions or discharge  Cervix: No gross lesions or discharge.  Pap reflex done.  Uterus  AV, normal size, shape and consistency, non-tender and mobile  Adnexa  Without masses or tenderness  Anus: Normal                                                             Nexplanon procedure note (removal)  The patient presented to the office today requesting for removal of her Nexplanon that was placed  in the year 11/2017 on her Left arm.   On examination the nexplanon implant was palpated and the distal end  (end  closest to the elbow) was marked. The area was sterilized with Betadine solution. 1% lidocaine was used for local anesthesia and approximately 1 cc  was injected into the site that was marked where the incision was to be made. The local anesthetic was injected under the implant in an effort to keep it  close to the skin surface. Slight pressure pushing downward was made at the proximal end  of the implant in an effort to stabilize it. A bulge appeared indicating the distal end of the implant. A small transverse incision of 2 mm was made at that location. By gently pushing the implant toward the incision, the tip became visible. Grasping the implant with a curved  forcep facilitated in gently removing the implant. Full confirmation of the entire implant which is 4 cm long was inspected and was intact and was shown to the patient and discarded. After removing the implant, the incision was closed with 3Steri-Strips, a band-aid and a bandage. Patient will be instructed to remove the pressure bandage in 24 hours, the band-aid in 3 days and the Steri-Strips in 7 days.   Assessment/Plan:  40 y.o. female for annual exam   1. Encounter for routine gynecological examination with Papanicolaou smear of cervix Normal gynecologic exam.  Pap reflex done.  Breast exam normal.  Health labs with family nurse practitioner.  2. Encounter for Nexplanon removal Easy removal of Nexplanon, no complication.  Well tolerated by patient.  Post procedure precautions reviewed.  3. Encounter for initial prescription of injectable contraceptive Counseling on contraception done.  Decision to start on DepoProvera.  Risks/Benefits reviewed.  Injection given.  4. Obesity, morbid (HCC) Lower calorie/carb diet recommended.  Intermittent fasting recommended.  Aerobic activities 5 times a week and light weight lifting every 2 days.  Genia Del MD, 8:47 AM 11/19/2020

## 2020-11-20 LAB — PAP IG W/ RFLX HPV ASCU

## 2020-11-26 ENCOUNTER — Encounter: Payer: Self-pay | Admitting: Obstetrics & Gynecology

## 2020-12-14 ENCOUNTER — Other Ambulatory Visit: Payer: Self-pay | Admitting: Family

## 2020-12-14 ENCOUNTER — Telehealth: Payer: Self-pay | Admitting: Family

## 2020-12-14 NOTE — Telephone Encounter (Signed)
Lvm for patient to call back and scheduled follow up

## 2020-12-14 NOTE — Telephone Encounter (Signed)
Pt is overdue for follow up appointment. Please contact pt to schedule.

## 2020-12-15 NOTE — Telephone Encounter (Signed)
Patient is now scheduled for 12-26-20

## 2020-12-29 ENCOUNTER — Telehealth (INDEPENDENT_AMBULATORY_CARE_PROVIDER_SITE_OTHER): Payer: 59 | Admitting: Family

## 2020-12-29 ENCOUNTER — Other Ambulatory Visit: Payer: Self-pay

## 2020-12-29 ENCOUNTER — Encounter: Payer: Self-pay | Admitting: Family

## 2020-12-29 DIAGNOSIS — J45909 Unspecified asthma, uncomplicated: Secondary | ICD-10-CM | POA: Diagnosis not present

## 2020-12-29 DIAGNOSIS — J302 Other seasonal allergic rhinitis: Secondary | ICD-10-CM

## 2020-12-29 DIAGNOSIS — I10 Essential (primary) hypertension: Secondary | ICD-10-CM

## 2020-12-29 MED ORDER — HYDROCHLOROTHIAZIDE 25 MG PO TABS: 1.0000 | ORAL_TABLET | Freq: Every day | ORAL | 1 refills | Status: DC

## 2020-12-29 MED ORDER — AMLODIPINE BESYLATE 10 MG PO TABS: 1.0000 | ORAL_TABLET | Freq: Every day | ORAL | 1 refills | Status: DC

## 2020-12-29 MED ORDER — METOPROLOL SUCCINATE ER 50 MG PO TB24: 50.0000 mg | ORAL_TABLET | Freq: Every day | ORAL | 1 refills | Status: DC

## 2020-12-29 MED ORDER — ALBUTEROL SULFATE HFA 108 (90 BASE) MCG/ACT IN AERS: INHALATION_SPRAY | RESPIRATORY_TRACT | 3 refills | Status: DC

## 2020-12-29 MED ORDER — METOPROLOL SUCCINATE ER 100 MG PO TB24: 100.0000 mg | ORAL_TABLET | Freq: Every day | ORAL | 1 refills | Status: DC

## 2020-12-29 NOTE — Patient Instructions (Signed)
Please check your blood pressure and heart rate at home tonight and send me your readings via mychart.

## 2020-12-29 NOTE — Progress Notes (Signed)
Virtual Visit via Video Note  I connected with Amy Sutton on 12/29/20 at  6:00 PM EDT by a video enabled telemedicine application and verified that I am speaking with the correct person using two identifiers.  Location: Patient: parked car Provider: office   I discussed the limitations of evaluation and management by telemedicine and the availability of in person appointments. The patient expressed understanding and agreed to proceed. Only the patient and myself were present for today's video call.   History of Present Illness:  She has a new position at work. 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  HTN- She denies LE edema, SOB, CP.  She is maintained on toprol xl 150mg , amlodipine 10mg  and hctz 25mg  once daily. Pt reports bp reading 135/80.  BP Readings from Last 3 Encounters:  11/19/20 140/86  05/05/20 129/75  03/25/20 134/77   Asthma- mild symptoms with pollen.    Observations/Objective:   Gen: Awake, alert, no acute distress Resp: Breathing is even and non-labored Psych: calm/pleasant demeanor Neuro: Alert and Oriented x 3, + facial symmetry, speech is clear.   Assessment and Plan: HTN- bp is stable on toprol xl 150mg , amlodipine 10mg  and hctz 25mg  once daily. Continue same. Obtain follow up bmet to assess renal function/electrolytes.  Asthma/Seasonal allergies- stable on prn albuterol mdi. Continue same.    Follow Up Instructions:    I discussed the assessment and treatment plan with the patient. The patient was provided an opportunity to ask questions and all were answered. The patient agreed with the plan and demonstrated an understanding of the instructions.   The patient was advised to call back or seek an in-person evaluation if the symptoms worsen or if the condition fails to improve as anticipated.  11/21/20, NP

## 2021-01-05 ENCOUNTER — Other Ambulatory Visit (INDEPENDENT_AMBULATORY_CARE_PROVIDER_SITE_OTHER): Payer: 59

## 2021-01-05 ENCOUNTER — Other Ambulatory Visit: Payer: Self-pay

## 2021-01-05 DIAGNOSIS — I10 Essential (primary) hypertension: Secondary | ICD-10-CM | POA: Diagnosis not present

## 2021-01-05 LAB — BASIC METABOLIC PANEL
BUN: 11 mg/dL (ref 6–23)
CO2: 23 mEq/L (ref 19–32)
Calcium: 9.5 mg/dL (ref 8.4–10.5)
Chloride: 107 mEq/L (ref 96–112)
Creatinine, Ser: 0.76 mg/dL (ref 0.40–1.20)
GFR: 99.15 mL/min (ref >60.00)
Glucose, Bld: 114 mg/dL — ABNORMAL HIGH (ref 70–99)
Potassium: 3.8 mEq/L (ref 3.5–5.1)
Sodium: 140 mEq/L (ref 135–145)

## 2021-01-07 ENCOUNTER — Other Ambulatory Visit: Payer: 59

## 2021-02-04 ENCOUNTER — Ambulatory Visit: Payer: 59

## 2021-02-11 ENCOUNTER — Other Ambulatory Visit: Payer: Self-pay

## 2021-02-11 ENCOUNTER — Ambulatory Visit (INDEPENDENT_AMBULATORY_CARE_PROVIDER_SITE_OTHER): Payer: 59 | Admitting: *Deleted

## 2021-02-11 DIAGNOSIS — Z30013 Encounter for initial prescription of injectable contraceptive: Secondary | ICD-10-CM | POA: Diagnosis not present

## 2021-02-11 MED ORDER — MEDROXYPROGESTERONE ACETATE 150 MG/ML IM SUSP
150.0000 mg | Freq: Once | INTRAMUSCULAR | Status: AC
  Administered 2021-02-11: 150 mg via INTRAMUSCULAR

## 2021-06-01 ENCOUNTER — Ambulatory Visit: Payer: 59

## 2021-06-02 ENCOUNTER — Other Ambulatory Visit: Payer: Self-pay

## 2021-06-02 ENCOUNTER — Ambulatory Visit (INDEPENDENT_AMBULATORY_CARE_PROVIDER_SITE_OTHER): Payer: 59 | Admitting: *Deleted

## 2021-06-02 DIAGNOSIS — N912 Amenorrhea, unspecified: Secondary | ICD-10-CM

## 2021-06-02 DIAGNOSIS — Z3042 Encounter for surveillance of injectable contraceptive: Secondary | ICD-10-CM

## 2021-06-02 LAB — PREGNANCY, URINE: Preg Test, Ur: NEGATIVE

## 2021-06-02 MED ORDER — MEDROXYPROGESTERONE ACETATE 150 MG/ML IM SUSP
150.0000 mg | Freq: Once | INTRAMUSCULAR | Status: AC
  Administered 2021-06-02: 150 mg via INTRAMUSCULAR

## 2021-06-02 NOTE — Progress Notes (Signed)
Patient is here for Depo Provera Injection Patient is within Depo Provera Calender Limits no last depo given 02/11/21 Upt is being done  Next Depo Due between: dec 14- dec 28 Last AEX: 11/19/20 AEX Scheduled: none scheduled   Patient is aware when next depo is due  Pt tolerated Injection well.  Routed to provider for review, encounter closed.

## 2021-07-24 ENCOUNTER — Other Ambulatory Visit: Payer: Self-pay | Admitting: Family

## 2021-08-18 ENCOUNTER — Other Ambulatory Visit: Payer: Self-pay

## 2021-08-18 ENCOUNTER — Ambulatory Visit (INDEPENDENT_AMBULATORY_CARE_PROVIDER_SITE_OTHER): Payer: 59 | Admitting: Gynecology

## 2021-08-18 DIAGNOSIS — Z3042 Encounter for surveillance of injectable contraceptive: Secondary | ICD-10-CM

## 2021-08-18 MED ORDER — MEDROXYPROGESTERONE ACETATE 150 MG/ML IM SUSP
150.0000 mg | Freq: Once | INTRAMUSCULAR | Status: AC
  Administered 2021-08-18: 09:00:00 150 mg via INTRAMUSCULAR

## 2021-11-04 ENCOUNTER — Ambulatory Visit: Payer: 59

## 2021-11-04 ENCOUNTER — Other Ambulatory Visit: Payer: Self-pay | Admitting: Family

## 2021-11-04 NOTE — Telephone Encounter (Signed)
I sent a 14 day supply of metoprolol. I have not seen her in a year. Please contact pt to schedule follow up.  ?

## 2021-11-04 NOTE — Telephone Encounter (Signed)
Lvm for patient to call back

## 2021-11-05 ENCOUNTER — Other Ambulatory Visit: Payer: Self-pay

## 2021-11-05 ENCOUNTER — Encounter: Payer: Self-pay | Admitting: Obstetrics & Gynecology

## 2021-11-05 ENCOUNTER — Other Ambulatory Visit (HOSPITAL_COMMUNITY)
Admission: RE | Admit: 2021-11-05 | Discharge: 2021-11-05 | Disposition: A | Discharge status: Home / Self Care | Payer: 59 | Source: Ambulatory Visit | Attending: Obstetrics & Gynecology | Admitting: Obstetrics & Gynecology

## 2021-11-05 ENCOUNTER — Ambulatory Visit (INDEPENDENT_AMBULATORY_CARE_PROVIDER_SITE_OTHER): Payer: 59 | Admitting: Obstetrics & Gynecology

## 2021-11-05 VITALS — BP 130/90 | HR 76 | Resp 16 | Ht 67.5 in | Wt 288.0 lb

## 2021-11-05 DIAGNOSIS — Z3042 Encounter for surveillance of injectable contraceptive: Secondary | ICD-10-CM | POA: Diagnosis not present

## 2021-11-05 DIAGNOSIS — Z01419 Encounter for gynecological examination (general) (routine) without abnormal findings: Secondary | ICD-10-CM | POA: Insufficient documentation

## 2021-11-05 DIAGNOSIS — Z113 Encounter for screening for infections with a predominantly sexual mode of transmission: Secondary | ICD-10-CM

## 2021-11-05 MED ORDER — MEDROXYPROGESTERONE ACETATE 150 MG/ML IM SUSP
150.0000 mg | Freq: Once | INTRAMUSCULAR | Status: AC
  Administered 2021-11-05: 150 mg via INTRAMUSCULAR

## 2021-11-05 MED ORDER — MEDROXYPROGESTERONE ACETATE 150 MG/ML IM SUSP
150.0000 mg | INTRAMUSCULAR | 4 refills | Status: DC
Start: 2021-11-05 — End: 2022-09-27

## 2021-11-05 NOTE — Progress Notes (Signed)
? ? ?Amy Sutton [birth year] [id number] ? ? ?History:    41 y.o. G1P1L1 Single ?  ?RP:  Established patient presenting for annual gyn exam and Nexplanon removal ?  ?HPI: Well on Depo Provera x last year.  Due for injection today.  Had light intermittent spotting x 3 last weeks.  No pelvic pain.  Sexually active.  Pap Neg 11/2020. Repeat Pap reflex today.  STI screen done.  Breasts normal.  BMI 44.44.  Planning to improve diet and exercise more.  Health labs with Fam MD. ?  ? ?Past medical history,surgical history, family history and social history were all reviewed and documented in the EPIC chart. ? ?Gynecologic History ?No LMP recorded. Patient has had an injection. ? ?Obstetric History ?OB History  ?Gravida Para Term Preterm AB Living  ?1 1       1   ?SAB IAB Ectopic Multiple Live Births  ?           ?  ?# Outcome Date GA Lbr Len/2nd Weight Sex Delivery Anes PTL Lv  ?1 Para           ? ? ? ?ROS: A ROS was performed and pertinent positives and negatives are included in the history. ? GENERAL: No fevers or chills. HEENT: No change in vision, no earache, sore throat or sinus congestion. NECK: No pain or stiffness. CARDIOVASCULAR: No chest pain or pressure. No palpitations. PULMONARY: No shortness of breath, cough or wheeze. GASTROINTESTINAL: No abdominal pain, nausea, vomiting or diarrhea, melena or bright red blood per rectum. GENITOURINARY: No urinary frequency, urgency, hesitancy or dysuria. MUSCULOSKELETAL: No joint or muscle pain, no back pain, no recent trauma. DERMATOLOGIC: No rash, no itching, no lesions. ENDOCRINE: No polyuria, polydipsia, no heat or cold intolerance. No recent change in weight. HEMATOLOGICAL: No anemia or easy bruising or bleeding. NEUROLOGIC: No headache, seizures, numbness, tingling or weakness. PSYCHIATRIC: No depression, no loss of interest in normal activity or change in sleep pattern.  ?  ? ?Exam: ? ? ?BP 130/90   Pulse 76   Resp 16   Ht 5' 7.5" (1.715 m)   Wt 288 lb (130.6 kg)    BMI 44.44 kg/m?  ? ?Body mass index is 44.44 kg/m?. ? ?General appearance : Well developed well nourished female. No acute distress ?HEENT: Eyes: no retinal hemorrhage or exudates,  Neck supple, trachea midline, no carotid bruits, no thyroidmegaly ?Lungs: Clear to auscultation, no rhonchi or wheezes, or rib retractions  ?Heart: Regular rate and rhythm, no murmurs or gallops ?Breast:Examined in sitting and supine position were symmetrical in appearance, no palpable masses or tenderness,  no skin retraction, no nipple inversion, no nipple discharge, no skin discoloration, no axillary or supraclavicular lymphadenopathy ?Abdomen: no palpable masses or tenderness, no rebound or guarding ?Extremities: no edema or skin discoloration or tenderness ? ?Pelvic: Vulva: Normal ?            Vagina: No gross lesions or discharge ? Cervix: No gross lesions or discharge.  Pap reflex, Gono-Chlam done. ? Uterus  AV, normal size, shape and consistency, non-tender and mobile ? Adnexa  Without masses or tenderness ? Anus: Normal ? ? ?Assessment/Plan:  41 y.o. female for annual exam  ? ?1. Encounter for routine gynecological examination with Papanicolaou smear of cervix ?Well on Depo Provera x last year.  Due for injection today.  Had light intermittent spotting x 3 last weeks.  No pelvic pain.  Sexually active.  Pap Neg 11/2020. Repeat Pap reflex today.  STI screen done.  Breasts normal.  BMI 44.44.  Planning to improve diet and exercise more.  Health labs with Fam MD. ?- Cytology - PAP( [place]) ? ?2. Surveillance for Depo-Provera contraception ?Well on Depo Provera x last year.  Due for injection today.  Had light intermittent spotting x 3 last weeks.  No pelvic pain.  Sexually active.  Counseling on advancing the DepoProvera injection if worsening BTB.  No CI to continue.  Prescription sent to pharmacy. ? ?3. Screen for STD (sexually transmitted disease) ?- HIV antibody (with reflex) ?- RPR ?- Hepatitis B Surface AntiGEN ?-  Hepatitis C Antibody ?- Cytology - PAP( [place]) with Gono-Chlam ? ?4. Obesity, morbid (HCC) ?Lower calorie/carb diet.  Increase fitness activities. ? ?Other orders ?- medroxyPROGESTERone (DEPO-PROVERA) 150 MG/ML injection; Inject 1 mL (150 mg total) into the muscle every 3 (three) months.  ? ?Genia Del MD, 1:44 PM 11/05/2021 ? ?  ?

## 2021-11-05 NOTE — Addendum Note (Signed)
Addended by: Eliezer Bottom on: 11/05/2021 02:08 PM ? ? Modules accepted: Orders ? ?

## 2021-11-08 NOTE — Telephone Encounter (Signed)
Called but no answer, MyChart message sent 

## 2021-11-09 LAB — HEPATITIS C ANTIBODY
Hepatitis C Ab: NONREACTIVE
SIGNAL TO CUT-OFF: 0.02 (ref <1.00)

## 2021-11-09 LAB — RPR: RPR Ser Ql: NONREACTIVE

## 2021-11-09 LAB — HIV ANTIBODY (ROUTINE TESTING W REFLEX): HIV 1&2 Ab, 4th Generation: NONREACTIVE

## 2021-11-09 LAB — HEPATITIS B SURFACE ANTIGEN: Hepatitis B Surface Ag: NONREACTIVE

## 2021-11-10 LAB — CYTOLOGY - PAP
Chlamydia: NEGATIVE
Comment: NEGATIVE
Comment: NEGATIVE
Comment: NORMAL
Diagnosis: NEGATIVE
High risk HPV: NEGATIVE
Neisseria Gonorrhea: NEGATIVE

## 2021-11-18 ENCOUNTER — Telehealth: Payer: Self-pay

## 2021-11-18 NOTE — Telephone Encounter (Signed)
FYI.  ?Pt called inquiring about depo provera injection. Medication has just been administered on 11/05/21. Pt reports that she just picked up refill from pharmacy and is inquiring if mistake or not.  ? ?Pt informed via VM per DPR that it is ok that she received her medication from pharamcy. Her next one isnt due until 01/2022. Advised her to keep it stored in room temperature and not in a too hot/too cold area. Also advised her to keep at look at expiration date. Pt notified to call if any addition questions/concerns.  ?

## 2022-01-05 ENCOUNTER — Other Ambulatory Visit: Payer: 59

## 2022-01-21 ENCOUNTER — Ambulatory Visit (INDEPENDENT_AMBULATORY_CARE_PROVIDER_SITE_OTHER): Payer: 59 | Admitting: *Deleted

## 2022-01-21 DIAGNOSIS — Z3042 Encounter for surveillance of injectable contraceptive: Secondary | ICD-10-CM

## 2022-01-21 MED ORDER — MEDROXYPROGESTERONE ACETATE 150 MG/ML IM SUSP
150.0000 mg | Freq: Once | INTRAMUSCULAR | Status: AC
  Administered 2022-01-21: 150 mg via INTRAMUSCULAR

## 2022-02-08 ENCOUNTER — Ambulatory Visit: Payer: 59 | Admitting: Family

## 2022-02-12 ENCOUNTER — Other Ambulatory Visit: Payer: Self-pay | Admitting: Obstetrics & Gynecology

## 2022-02-21 ENCOUNTER — Ambulatory Visit: Payer: 59 | Admitting: Family

## 2022-02-21 VITALS — BP 167/55 | HR 92 | Temp 98.4°F | Resp 16 | Wt 292.0 lb

## 2022-02-21 DIAGNOSIS — R739 Hyperglycemia, unspecified: Secondary | ICD-10-CM

## 2022-02-21 DIAGNOSIS — J45909 Unspecified asthma, uncomplicated: Secondary | ICD-10-CM | POA: Diagnosis not present

## 2022-02-21 DIAGNOSIS — G8929 Other chronic pain: Secondary | ICD-10-CM

## 2022-02-21 DIAGNOSIS — M545 Low back pain, unspecified: Secondary | ICD-10-CM

## 2022-02-21 DIAGNOSIS — I10 Essential (primary) hypertension: Secondary | ICD-10-CM | POA: Diagnosis not present

## 2022-02-21 LAB — BASIC METABOLIC PANEL
BUN: 7 mg/dL (ref 6–23)
CO2: 24 mEq/L (ref 19–32)
Calcium: 9.5 mg/dL (ref 8.4–10.5)
Chloride: 108 mEq/L (ref 96–112)
Creatinine, Ser: 0.78 mg/dL (ref 0.40–1.20)
GFR: 95.35 mL/min (ref >60.00)
Glucose, Bld: 86 mg/dL (ref 70–99)
Potassium: 3.8 mEq/L (ref 3.5–5.1)
Sodium: 140 mEq/L (ref 135–145)

## 2022-02-21 LAB — HEMOGLOBIN A1C: Hgb A1c MFr Bld: 5.4 % (ref 4.6–6.5)

## 2022-02-21 MED ORDER — MELOXICAM 7.5 MG PO TABS: 7.5000 mg | ORAL_TABLET | Freq: Every day | ORAL | 0 refills | Status: DC | PRN

## 2022-02-21 MED ORDER — CARVEDILOL 12.5 MG PO TABS: 12.5000 mg | ORAL_TABLET | Freq: Two times a day (BID) | ORAL | 3 refills | Status: DC

## 2022-02-21 MED ORDER — AMLODIPINE BESYLATE 10 MG PO TABS: 10.0000 mg | ORAL_TABLET | Freq: Every day | ORAL | 1 refills | Status: DC

## 2022-02-21 MED ORDER — ALBUTEROL SULFATE HFA 108 (90 BASE) MCG/ACT IN AERS: INHALATION_SPRAY | RESPIRATORY_TRACT | 3 refills | Status: DC

## 2022-02-21 MED ORDER — HYDROCHLOROTHIAZIDE 25 MG PO TABS: 25.0000 mg | ORAL_TABLET | Freq: Every day | ORAL | 1 refills | Status: DC

## 2022-02-21 NOTE — Progress Notes (Signed)
Subjective:   By signing my name below, I, [name], attest that this documentation has been prepared under the direction and in the presence of [name], NP 02/21/2022    Patient ID: Amy Sutton, female    DOB: October 20, 1980, 41 y.o.   MRN: [id number]  Chief Complaint  Patient presents with   Hypertension    Here for follow up    Hypertension   Patient is in today for a follow up visit.   Hypertension- Her blood pressure is elevated during this visit. She stopped taking 100 mg metoprolol succinate daily PO due to experiencing dizziness while taking both the 50 and 100 mg dose. She was experiencing dizziness throughout the day on days she taken both 100 and 50 mg doses. She denies experiencing dizziness while only taking 50 mg metoprolol succinate with her other blood pressure medications. She continues taking 10 mg amlodipine daily PO, 25 mg hydrochlorothiazide daily PO, 50 mg metoprolol succinate daily PO and reports no new issues while taking them. She has not measured her blood pressure at home in a while. She reports measuring her blood pressure prior to there last visit and reports it measured normal.  BP Readings from Last 3 Encounters:  02/21/22 (!) 167/55  11/05/21 130/90  11/19/20 140/86   Pulse Readings from Last 3 Encounters:  02/21/22 92  11/05/21 76  05/05/20 83   Back pain- She continues experiencing occasional dull back pain. Her back pain keeps her up at night.   Allergies- She is managing her asthma well at this time. She is requesting a refill on her albuterol inhaler.   Birth control- She continues receiving depo injections form her GYN specialist regularly.    There are no preventive care reminders to display for this patient.  Past Medical History:  Diagnosis Date   Hypertension     Past Surgical History:  Procedure Laterality Date   Nexplanon     Inserted 11-2017    Family History  Problem Relation Age of Onset   Arthritis  Mother    Hypertension Mother    Diabetes Mother    Hyperlipidemia Mother    Hypertension Father    Diabetes Father    Hyperlipidemia Father    Cancer Maternal Uncle        prostate   Cancer Paternal Aunt        Lymphoma   Asthma Son    Obesity Son     Social History   Socioeconomic History   Marital status: Single    Spouse name: Not on file   Number of children: 1   Years of education: Not on file   Highest education level: Not on file  Occupational History    Employer: [employer]  Tobacco Use   Smoking status: Never   Smokeless tobacco: Never  Vaping Use   Vaping Use: Never used  Substance and Sexual Activity   Alcohol use: Not Currently   Drug use: No   Sexual activity: Yes    Partners: Male    Birth control/protection: Injection    Comment: 1st intercourse 89 yo-5 partners-,   Other Topics Concern   Not on file  Social History Narrative   Regular exercise:  Not currently   Caffeine Use:  Sometimes   33 yr old son   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 1 mL 4   albuterol (VENTOLIN HFA) 108 (90 Base) MCG/ACT inhaler 2 puffs every 6 hours as needed. 6.7 g 3   amLODipine (NORVASC) 10 MG tablet TAKE 1 TABLET BY MOUTH EVERY DAY 90 tablet 1   hydrochlorothiazide (HYDRODIURIL) 25 MG tablet TAKE 1 TABLET (25 MG TOTAL) BY MOUTH DAILY. 90 tablet 1   metoprolol  succinate (TOPROL-XL) 50 MG 24 hr tablet TAKE 1 TABLET BY MOUTH DAILY. TAKE WITH OR IMMEDIATELY FOLLOWING A MEAL. 90 tablet 1   metoprolol succinate (TOPROL-XL) 100 MG 24 hr tablet TAKE 1 TABLET BY MOUTH DAILY. TAKE WITH OR IMMEDIATELY FOLLOWING A MEAL. (Patient not taking: Reported on 02/21/2022) 14 tablet 0   No facility-administered medications prior to visit.    No Known Allergies  Review of Systems  Musculoskeletal:  Positive for back pain (dull back pain).       Objective:    Physical Exam Constitutional:      General: She is not in acute distress.    Appearance: Normal appearance. She is not ill-appearing.  HENT:     Head: Normocephalic and atraumatic.     Right Ear: External ear normal.     Left Ear: External ear normal.  Eyes:     Extraocular Movements: Extraocular movements intact.     Pupils: Pupils are equal, round, and reactive to light.  Cardiovascular:     Rate and Rhythm: Normal rate and regular rhythm.     Heart sounds: Normal heart sounds. No murmur heard.    No gallop.  Pulmonary:     Effort: Pulmonary effort is normal. No respiratory distress.     Breath sounds: Normal breath sounds. No wheezing or rales.  Skin:    General: Skin is warm and dry.  Neurological:     Mental Status: She is alert and oriented to person, place, and time.  Psychiatric:        Judgment: Judgment normal.     BP (!) 167/55 (BP Location: Right Arm, Patient Position: Sitting, Cuff Size: Large)   Pulse 92   Temp 98.4 F (36.9 C) (Oral)   Resp 16   Wt 292 lb (132.5 kg)   SpO2 100%   BMI 45.06 kg/m  Wt Readings from Last 3 Encounters:  02/21/22 292 lb (132.5 kg)  11/05/21 288 lb (130.6 kg)  11/19/20 286 lb (129.7 kg)       Assessment & Plan:   Problem List Items Addressed This Visit       Unprioritized   Hyperglycemia - Primary    Lab Results  Component Value Date   HGBA1C 5.7 06/11/2018  Both parents have DM. Sugars have been mildly elevated. Will obtain A1C.        Relevant Orders   Basic metabolic panel   Hemoglobin A1c   HTN (hypertension)    Uncontrolled. Had dizziness on higher dose of toprol xl (150) so she dropped to 50mg . BP is high. Had cough with ARB. Already on CCB and diuretic. Advised pt to d/c toprol  xl and begin carvedilol 12.5 mg bid.       Relevant Medications   carvedilol (COREG) 12.5 MG tablet   amLODipine (NORVASC) 10 MG tablet   hydrochlorothiazide (HYDRODIURIL) 25 MG tablet   Chronic midline low back pain without sciatica    New. Will rx with meloxicam once daily prn. Also gave pt exercises to do at home.       Relevant Medications   meloxicam (MOBIC) 7.5 MG tablet   Asthma    Stable on prn albuterol. Continue same.       Relevant Medications   albuterol (VENTOLIN HFA) 108 (90 Base) MCG/ACT inhaler     Meds ordered this encounter  Medications   carvedilol (COREG) 12.5 MG tablet    Sig: Take 1 tablet (12.5 mg total) by mouth 2 (two) times daily with a meal.    Dispense:  60 tablet    Refill:  3    Order Specific Question:   Supervising Provider    Answer:   Penni Homans A [[id number]]   albuterol (VENTOLIN HFA) 108 (90 Base) MCG/ACT inhaler    Sig: 2 puffs every 6 hours as needed.    Dispense:  6.7 g    Refill:  3    Order Specific Question:   Supervising Provider    Answer:   Penni Homans A [[id number]]   amLODipine (NORVASC) 10 MG tablet    Sig: Take 1 tablet (10 mg total) by mouth daily.    Dispense:  90 tablet    Refill:  1    Order Specific Question:   Supervising Provider    Answer:   Penni Homans A [[id number]]   hydrochlorothiazide (HYDRODIURIL) 25 MG tablet    Sig: Take 1 tablet (25 mg total) by mouth daily.    Dispense:  90 tablet    Refill:  1    Order Specific Question:   Supervising Provider    Answer:   Penni Homans A [[id number]]   meloxicam (MOBIC) 7.5 MG tablet    Sig: Take 1 tablet (7.5 mg total) by mouth daily as needed for pain.    Dispense:  14 tablet    Refill:  0    Order Specific Question:    Supervising Provider    Answer:   Penni Homans A [[id number]]    I, Nance Pear, NP, personally preformed the services described in this documentation.  All medical record entries made by the scribe were at my direction and in my presence.  I have reviewed the chart and discharge instructions (if applicable) and agree that the record reflects my personal performance and is accurate and complete. 02/21/2022   I,[name],acting as a Education administrator for Nance Pear, NP.,have documented all relevant documentation on the behalf of Nance Pear, NP,as directed by  Nance Pear, NP while in the presence of Nance Pear, NP.   Nance Pear, NP

## 2022-02-21 NOTE — Assessment & Plan Note (Signed)
Lab Results  Component Value Date   HGBA1C 5.7 06/11/2018   Both parents have DM. Sugars have been mildly elevated. Will obtain A1C.

## 2022-02-21 NOTE — Assessment & Plan Note (Signed)
New. Will rx with meloxicam once daily prn. Also gave pt exercises to do at home.

## 2022-02-21 NOTE — Patient Instructions (Addendum)
Stop Toprol xl. Start carvedilol 12.5mg  twice daily.  Begin meloxicam once daily as needed for low back pain. Try to do the attached exercises once daily for back pain.

## 2022-02-21 NOTE — Assessment & Plan Note (Signed)
Uncontrolled. Had dizziness on higher dose of toprol xl (150) so she dropped to 50mg . BP is high. Had cough with ARB. Already on CCB and diuretic. Advised pt to d/c toprol xl and begin carvedilol 12.5 mg bid.

## 2022-02-21 NOTE — Assessment & Plan Note (Signed)
Stable on prn albuterol.  Continue same.  

## 2022-02-24 ENCOUNTER — Other Ambulatory Visit: Payer: Self-pay | Admitting: Family

## 2022-03-11 ENCOUNTER — Ambulatory Visit: Payer: 59 | Admitting: Family

## 2022-03-11 DIAGNOSIS — M545 Low back pain, unspecified: Secondary | ICD-10-CM

## 2022-03-11 DIAGNOSIS — G8929 Other chronic pain: Secondary | ICD-10-CM

## 2022-03-11 DIAGNOSIS — I10 Essential (primary) hypertension: Secondary | ICD-10-CM

## 2022-03-11 NOTE — Progress Notes (Addendum)
Subjective:   By signing my name below, I, Amy Sutton, attest that this documentation has been prepared under the direction and in the presence of Amy Craze, NP  03/11/2022    Patient ID: Amy Sutton, female    DOB: 01/02/1981, 41 y.o.   MRN: [id number]  Chief Complaint  Patient presents with   Follow-up    2 weeks    HPI Patient is in today for an office visit.   Meloxicam- She reports that her back pain has improved. She only took meloxicam once since last visit.   HTN- last visit we discontinued metoprolol and instead placed her on carvedilol.  She denies any dizziness since she started carvedilol. Her blood pressure is elevated today. She did not take her medication this AM.   BP Readings from Last 3 Encounters:  03/11/22 (!) 152/98  02/21/22 (!) 167/55  11/05/21 130/90   Pulse Readings from Last 3 Encounters:  03/11/22 83  02/21/22 92  11/05/21 76      Past Medical History:  Diagnosis Date   Hypertension     Past Surgical History:  Procedure Laterality Date   Nexplanon     Inserted 11-2017    Family History  Problem Relation Age of Onset   Arthritis Mother    Hypertension Mother    Diabetes Mother    Hyperlipidemia Mother    Hypertension Father    Diabetes Father    Hyperlipidemia Father    Cancer Maternal Uncle        prostate   Cancer Paternal Aunt        Lymphoma   Asthma Son    Obesity Son     Social History   Socioeconomic History   Marital status: Single    Spouse name: Not on file   Number of children: 1   Years of education: Not on file   Highest education level: Not on file  Occupational History    Employer: [employer]  Tobacco Use   Smoking status: Never   Smokeless tobacco: Never  Vaping Use   Vaping Use: Never used  Substance and Sexual Activity   Alcohol use: Not Currently   Drug use: No   Sexual activity: Yes    Partners: Male    Birth control/protection: Injection    Comment: 1st  intercourse 23 yo-5 partners-,   Other Topics Concern   Not on file  Social History Narrative   Regular exercise:  Not currently   Caffeine Use:  Sometimes   86 yr old son   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 6.7 g 3   amLODipine (NORVASC) 10 MG tablet Take 1 tablet (10 mg total) by mouth daily. 90 tablet 1   carvedilol (COREG) 12.5 MG tablet Take 1 tablet (12.5 mg total) by mouth 2 (two) times daily with a meal. 60  tablet 3   hydrochlorothiazide (HYDRODIURIL) 25 MG tablet Take 1 tablet (25 mg total) by mouth daily. 90 tablet 1   medroxyPROGESTERone (DEPO-PROVERA) 150 MG/ML injection Inject 1 mL (150 mg total) into the muscle every 3 (three) months. 1 mL 4   meloxicam (MOBIC) 7.5 MG tablet Take 1 tablet (7.5 mg total) by mouth daily as needed for pain. 14 tablet 0   No facility-administered medications prior to visit.    No Known Allergies  Review of Systems  Neurological:  Positive for dizziness.       Objective:    Physical Exam Constitutional:      Appearance: Normal appearance.  HENT:     Head: Normocephalic and atraumatic.  Eyes:     Extraocular Movements: Extraocular movements intact.     Pupils: Pupils are equal, round, and reactive to light.  Cardiovascular:     Rate and Rhythm: Normal rate and regular rhythm.     Pulses: Normal pulses.     Heart sounds: Normal heart sounds. No murmur heard.    No  gallop.  Pulmonary:     Effort: Pulmonary effort is normal. No respiratory distress.     Breath sounds: Normal breath sounds. No wheezing.  Skin:    General: Skin is warm and dry.  Neurological:     Mental Status: She is alert and oriented to person, place, and time.  Psychiatric:        Judgment: Judgment normal.     BP (!) 152/98   Pulse 83   Resp 18   Ht 5' 7.5" (1.715 m)   Wt 291 lb (132 kg)   SpO2 100%   BMI 44.90 kg/m  Wt Readings from Last 3 Encounters:  03/11/22 291 lb (132 kg)  02/21/22 292 lb (132.5 kg)  11/05/21 288 lb (130.6 kg)     Assessment & Plan:   Problem List Items Addressed This Visit       Unprioritized   HTN (hypertension)    BP Readings from Last 3 Encounters:  03/11/22 (!) 152/98  02/21/22 (!) 167/55  11/05/21 130/90  BP is elevated this AM, however she did not take her AM medications today.  I recommended that she schedule a follow up appointment to recheck her blood pressure on a day when she has taken her medication.        Chronic midline low back pain without sciatica    This has improved. Only needed to take meloxicam once.           No orders of the defined types were placed in this encounter.   I, Lemont Fillers, NP, personally preformed the services described in this documentation.  All medical record entries made by the scribe were at my direction and in my presence.  I have reviewed the chart and discharge instructions (if applicable) and agree that the record reflects my personal performance and is accurate and complete. 03/11/2022   I,Amy Sutton,acting as a scribe for Lemont Fillers, NP.,have documented all relevant documentation on the behalf of Lemont Fillers, NP,as directed by  Lemont Fillers, NP while in the presence of Lemont Fillers, NP.    Lemont Fillers, NP

## 2022-03-11 NOTE — Assessment & Plan Note (Addendum)
This has improved. Only needed to take meloxicam once.

## 2022-03-11 NOTE — Assessment & Plan Note (Signed)
BP Readings from Last 3 Encounters:  03/11/22 (!) 152/98  02/21/22 (!) 167/55  11/05/21 130/90   BP is elevated this AM, however she did not take her AM medications today.  I recommended that she schedule a follow up appointment to recheck her blood pressure on a day when she has taken her medication.

## 2022-03-23 ENCOUNTER — Ambulatory Visit: Payer: 59 | Admitting: Family

## 2022-03-23 ENCOUNTER — Encounter: Payer: Self-pay | Admitting: Family

## 2022-03-23 DIAGNOSIS — I10 Essential (primary) hypertension: Secondary | ICD-10-CM | POA: Diagnosis not present

## 2022-03-23 MED ORDER — CARVEDILOL 25 MG PO TABS: 25.0000 mg | ORAL_TABLET | Freq: Two times a day (BID) | ORAL | 3 refills | Status: DC

## 2022-03-23 NOTE — Patient Instructions (Signed)
Please increase carvedilol to 25mg twice daily

## 2022-03-23 NOTE — Assessment & Plan Note (Signed)
Blood pressure is improving but still above goal. Will increase carvedilol from 12.5mg  bid to 25mg  bid. Continue hctz and amlodipine.

## 2022-03-23 NOTE — Progress Notes (Signed)
Subjective:   By signing my name below, I, [name], attest that this documentation has been prepared under the direction and in the presence of [name], NP. 03/23/2022    Patient ID: Amy Sutton, female    DOB: 1980-09-20, 41 y.o.   MRN: [id number]  Chief Complaint  Patient presents with   Hypertension   Patient is in today for a office visit.   Hypertension- Her blood pressure has improved since last visit but her blood pressure is still elevated. She continues taking 10 mg amlodipine daily PO, 25 mg carvedilol daily PO, 25 mg hydrochlorothiazide daily PO and reports no new issues while taking them.  BP Readings from Last 3 Encounters:  03/23/22 (!) 145/83  03/11/22 (!) 152/98  02/21/22 (!) 167/55   Pulse Readings from Last 3 Encounters:  03/23/22 90  03/11/22 83  02/21/22 92    There are no preventive care reminders to display for this patient.  Past Medical History:  Diagnosis Date   Hypertension     Past Surgical History:  Procedure Laterality Date   Nexplanon     Inserted 11-2017    Family History  Problem Relation Age of Onset   Arthritis Mother    Hypertension Mother    Diabetes Mother    Hyperlipidemia Mother    Hypertension Father    Diabetes Father    Hyperlipidemia Father    Cancer Maternal Uncle        prostate   Cancer Paternal Aunt        Lymphoma   Asthma Son    Obesity Son     Social History   Socioeconomic History   Marital status: Single    Spouse name: Not on file   Number of children: 1   Years of education: Not on file   Highest education level: Not on file  Occupational History    Employer: [employer]  Tobacco Use   Smoking status: Never   Smokeless tobacco: Never  Vaping Use   Vaping Use: Never used  Substance and Sexual Activity   Alcohol use: Not Currently   Drug use: No   Sexual activity: Yes    Partners: Male    Birth control/protection: Injection    Comment: 1st intercourse 78  yo-5 partners-,   Other Topics Concern   Not on file  Social History Narrative   Regular exercise:  Not currently   Caffeine Use:  Sometimes   83 yr old son   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 6.7 g 3   amLODipine (NORVASC) 10 MG tablet Take 1 tablet (10 mg total) by mouth daily. 90 tablet 1   hydrochlorothiazide (HYDRODIURIL) 25 MG tablet Take 1 tablet (25 mg total) by mouth daily. 90 tablet 1   medroxyPROGESTERone (DEPO-PROVERA) 150 MG/ML injection Inject 1 mL (150 mg total) into  the muscle every 3 (three) months. 1 mL 4   meloxicam (MOBIC) 7.5 MG tablet Take 1 tablet (7.5 mg total) by mouth daily as needed for pain. 14 tablet 0   carvedilol (COREG) 12.5 MG tablet Take 1 tablet (12.5 mg total) by mouth 2 (two) times daily with a meal. 60 tablet 3   No facility-administered medications prior to visit.    No Known Allergies  ROS See HPI    Objective:    Physical Exam Constitutional:      General: She is not in acute distress.    Appearance: Normal appearance. She is not ill-appearing.  HENT:     Head: Normocephalic and atraumatic.     Right Ear: External ear normal.     Left Ear: External ear normal.  Eyes:     Extraocular Movements: Extraocular movements intact.     Pupils: Pupils are equal, round, and reactive to light.  Cardiovascular:     Rate and Rhythm: Normal rate and regular rhythm.     Heart sounds:  Normal heart sounds. No murmur heard.    No gallop.  Pulmonary:     Effort: Pulmonary effort is normal. No respiratory distress.     Breath sounds: Normal breath sounds. No wheezing or rales.  Skin:    General: Skin is warm and dry.  Neurological:     Mental Status: She is alert and oriented to person, place, and time.  Psychiatric:        Judgment: Judgment normal.     BP (!) 145/83 Comment: took bp meds at 6:45 am  Pulse 90   Temp 98.6 F (37 C) (Oral)   Ht 5\' 8"  (1.727 m)   Wt 294 lb 6 oz (133.5 kg)   SpO2 100%   BMI 44.76 kg/m  Wt Readings from Last 3 Encounters:  03/23/22 294 lb 6 oz (133.5 kg)  03/11/22 291 lb (132 kg)  02/21/22 292 lb (132.5 kg)       Assessment & Plan:   Problem List Items Addressed This Visit       Unprioritized   HTN (hypertension)    Blood pressure is improving but still above goal. Will increase carvedilol from 12.5mg  bid to 25mg  bid. Continue hctz and amlodipine.       Relevant Medications   carvedilol (COREG) 25 MG tablet     Meds ordered this encounter  Medications   carvedilol (COREG) 25 MG tablet    Sig: Take 1 tablet (25 mg total) by mouth 2 (two) times daily with a meal.    Dispense:  60 tablet    Refill:  3    Order Specific Question:   Supervising Provider    Answer:   02/23/22 A [[id number]]    I, , NP, personally preformed the services described in this documentation.  All medical record entries made by the scribe were at my direction and in my presence.  I have reviewed the chart and discharge instructions (if applicable) and agree that the record reflects my personal performance and is accurate and complete. 03/23/2022   I,[name],acting as a Lemont Fillers for 03/25/2022, NP.,have documented all relevant documentation on the behalf of Neurosurgeon, NP,as directed by  Lemont Fillers, NP while in the presence of Lemont Fillers, NP.   Lemont Fillers, NP

## 2022-04-04 ENCOUNTER — Other Ambulatory Visit: Payer: Self-pay | Admitting: Family

## 2022-04-06 ENCOUNTER — Ambulatory Visit: Payer: 59 | Admitting: Family

## 2022-04-06 DIAGNOSIS — I10 Essential (primary) hypertension: Secondary | ICD-10-CM

## 2022-04-06 MED ORDER — VALSARTAN 160 MG PO TABS: 160.0000 mg | ORAL_TABLET | Freq: Every day | ORAL | 3 refills | Status: DC

## 2022-04-06 NOTE — Patient Instructions (Signed)
Add valsartan 160mg  once daily.

## 2022-04-06 NOTE — Progress Notes (Signed)
Subjective:   By signing my name below, I, Parks Ranger, attest that this documentation has been prepared under the direction and in the presence of [name], NP 04/06/2022    Patient ID: Amy Sutton, female    DOB: March 13, 1981, 41 y.o.   MRN: [id number]  Chief Complaint  Patient presents with   Follow-up    HPI Patient is in today for follow up visit.   Hypertension: Her blood pressure was continues to be high despit etaking her medications. Her blood pressure during the 145/85 mmHg.  Exercise and Diet: She reports that she has been exercising regularly and follows a healthy diet.  Medications: She remains complaint with amlodipine and carvedilol and reports that she did take her medications prior to the visit. She also uses Depo-Provera to manage her birthcontrol, and is administered by her gynecologist. She no longer takes losartan due to symptoms of dizziness. She also does not use lisinopril because it caused her ace cough.   Health Maintenance Due  Topic Date Due   INFLUENZA VACCINE  04/05/2022    Past Medical History:  Diagnosis Date   Hypertension     Past Surgical History:  Procedure Laterality Date   Nexplanon     Inserted 11-2017    Family History  Problem Relation Age of Onset   Arthritis Mother    Hypertension Mother    Diabetes Mother    Hyperlipidemia Mother    Hypertension Father    Diabetes Father    Hyperlipidemia Father    Cancer Maternal Uncle        prostate   Cancer Paternal Aunt        Lymphoma   Asthma Son    Obesity Son     Social History   Socioeconomic History   Marital status: Single    Spouse name: Not on file   Number of children: 1   Years of education: Not on file   Highest education level: Not on file  Occupational History    Employer: [employer]  Tobacco Use   Smoking status: Never   Smokeless tobacco: Never  Vaping Use   Vaping Use: Never used  Substance and Sexual Activity   Alcohol  use: Not Currently   Drug use: No   Sexual activity: Yes    Partners: Male    Birth control/protection: Injection    Comment: 1st intercourse 58 yo-5 partners-,   Other Topics Concern   Not on file  Social History Narrative   Regular exercise:  Not currently   Caffeine Use:  Sometimes   31 yr old son   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 6.7 g 3   amLODipine (NORVASC) 10 MG tablet Take 1 tablet (10 mg total) by mouth daily. 90 tablet 1   carvedilol (COREG) 25 MG tablet Take 1 tablet (25 mg  total) by mouth 2 (two) times daily with a meal. 60 tablet 3   hydrochlorothiazide (HYDRODIURIL) 25 MG tablet Take 1 tablet (25 mg total) by mouth daily. 90 tablet 1   medroxyPROGESTERone (DEPO-PROVERA) 150 MG/ML injection Inject 1 mL (150 mg total) into the muscle every 3 (three) months. 1 mL 4   meloxicam (MOBIC) 7.5 MG tablet Take 1 tablet (7.5 mg total) by mouth daily as needed for pain. 14 tablet 0   No facility-administered medications prior to visit.    No Known Allergies  ROS     Objective:    Physical Exam Constitutional:      Appearance: Normal appearance. She is not ill-appearing.  HENT:     Head: Normocephalic and atraumatic.     Right Ear: External ear normal.     Left Ear: External ear normal.  Eyes:     Extraocular Movements: Extraocular movements intact.     Right eye: No nystagmus.     Left eye: No nystagmus.      Pupils: Pupils are equal, round, and reactive to light.  Cardiovascular:     Rate and Rhythm: Normal rate and regular rhythm.     Pulses: Normal pulses.     Heart sounds: Normal heart sounds. No murmur heard.    No gallop.  Pulmonary:     Effort: Pulmonary effort is normal. No respiratory distress.     Breath sounds: Normal breath sounds. No wheezing or rales.  Lymphadenopathy:     Cervical: No cervical adenopathy.  Skin:    General: Skin is warm and dry.  Neurological:     Mental Status: She is alert and oriented to person, place, and time.  Psychiatric:        Behavior: Behavior normal.        Judgment: Judgment normal.     BP (!) 145/85   Pulse 74   Temp 98.5 F (36.9 C)   Resp 18   Ht 5\' 8"  (1.727 m)   Wt 296 lb (134.3 kg)   SpO2 100%   BMI 45.01 kg/m  Wt Readings from Last 3 Encounters:  04/06/22 296 lb (134.3 kg)  03/23/22 294 lb 6 oz (133.5 kg)  03/11/22 291 lb (132 kg)       Assessment & Plan:   Problem List Items Addressed This Visit       Unprioritized   HTN (hypertension)    BP Readings from Last 3 Encounters:  04/06/22 (!) 150/70  03/23/22 (!) 145/83  03/11/22 (!) 152/98  Uncontrolled.  She had some dizziness on losartan previously. Cough on lisinopril. We will retry arb with valsartan.  She will follow back up in 1 week.        Relevant Medications   valsartan (DIOVAN) 160 MG tablet     Meds ordered this encounter  Medications   valsartan (DIOVAN) 160 MG tablet    Sig: Take 1 tablet (160 mg total) by mouth daily.    Dispense:  30 tablet    Refill:  3    Order Specific Question:   Supervising Provider    Answer:   05/12/22 A [[id number]]    I, Danise Edge, NP, personally preformed the services described in this documentation.  All medical record entries made by the scribe were at my direction and in my presence.  I have reviewed the chart and discharge instructions (if applicable) and agree that the record reflects my  personal performance and is accurate and complete. 04/06/2022  I,Tinashe Williams,acting as a Neurosurgeon for [employer], NP.,have documented all relevant documentation on the behalf of Lemont Fillers, NP,as directed by  Lemont Fillers, NP while in the presence of Lemont Fillers, NP.    Lemont Fillers, NP

## 2022-04-06 NOTE — Assessment & Plan Note (Addendum)
BP Readings from Last 3 Encounters:  04/06/22 (!) 150/70  03/23/22 (!) 145/83  03/11/22 (!) 152/98   Uncontrolled.  She had some dizziness on losartan previously. Cough on lisinopril. We will retry arb with valsartan.  She will follow back up in 1 week.

## 2022-04-08 ENCOUNTER — Ambulatory Visit (INDEPENDENT_AMBULATORY_CARE_PROVIDER_SITE_OTHER): Payer: 59

## 2022-04-08 DIAGNOSIS — Z3042 Encounter for surveillance of injectable contraceptive: Secondary | ICD-10-CM

## 2022-04-08 MED ORDER — MEDROXYPROGESTERONE ACETATE 150 MG/ML IM SUSP
150.0000 mg | Freq: Once | INTRAMUSCULAR | Status: AC
  Administered 2022-04-08: 150 mg via INTRAMUSCULAR

## 2022-04-08 NOTE — Progress Notes (Signed)
Depo Provera 150mg  injection given IM LUOQ.  Patient tolerated injection well.  Next injection is due 06/24/22-07/08/22.  Patient supplied medication.

## 2022-04-13 ENCOUNTER — Ambulatory Visit: Payer: 59 | Admitting: Family

## 2022-04-13 VITALS — BP 134/69 | HR 82 | Temp 98.5°F | Resp 16 | Wt 297.0 lb

## 2022-04-13 DIAGNOSIS — G8929 Other chronic pain: Secondary | ICD-10-CM | POA: Diagnosis not present

## 2022-04-13 DIAGNOSIS — I10 Essential (primary) hypertension: Secondary | ICD-10-CM | POA: Diagnosis not present

## 2022-04-13 DIAGNOSIS — M545 Low back pain, unspecified: Secondary | ICD-10-CM | POA: Diagnosis not present

## 2022-04-13 LAB — BASIC METABOLIC PANEL
BUN: 12 mg/dL (ref 6–23)
CO2: 24 mEq/L (ref 19–32)
Calcium: 9.4 mg/dL (ref 8.4–10.5)
Chloride: 106 mEq/L (ref 96–112)
Creatinine, Ser: 0.76 mg/dL (ref 0.40–1.20)
GFR: 98.27 mL/min (ref >60.00)
Glucose, Bld: 81 mg/dL (ref 70–99)
Potassium: 3.9 mEq/L (ref 3.5–5.1)
Sodium: 140 mEq/L (ref 135–145)

## 2022-04-13 MED ORDER — MELOXICAM 7.5 MG PO TABS
7.5000 mg | ORAL_TABLET | Freq: Every day | ORAL | 1 refills | Status: DC | PRN
Start: 2022-04-13 — End: 2022-05-31

## 2022-04-13 NOTE — Progress Notes (Signed)
Subjective:   By signing my name below, I, [name], attest that this documentation has been prepared under the direction and in the presence of [name], NP 04/13/2022    Patient ID: Amy Sutton, female    DOB: 1981-04-22, 41 y.o.   MRN: [id number]  Chief Complaint  Patient presents with   Hypertension    Here for follow up    Hypertension   Patient is in today for a follow up visit.   Blood pressure- Her blood pressure is doing well during this visit. She started 160 mg valsartan daily PO last week and reports no new issues while taking it. She denies dizziness while taking it. She continues taking 10 mg amlodipine, 25 mg hydrochlorothiazide, 25 mg carvedilol and reports no new issues while taking them.  BP Readings from Last 3 Encounters:  04/13/22 134/69  04/06/22 (!) 145/85  03/23/22 (!) 145/83   Pulse Readings from Last 3 Encounters:  04/13/22 82  04/06/22 74  03/23/22 90   Meloxicam- She is requesting a refill for 7.5 mg meloxicam to manage her low back pain from her sciatica.   Health Maintenance Due  Topic Date Due   INFLUENZA VACCINE  04/05/2022    Past Medical History:  Diagnosis Date   Hypertension     Past Surgical History:  Procedure Laterality Date   Nexplanon     Inserted 11-2017    Family History  Problem Relation Age of Onset   Arthritis Mother    Hypertension Mother    Diabetes Mother    Hyperlipidemia Mother    Hypertension Father    Diabetes Father    Hyperlipidemia Father    Cancer Maternal Uncle        prostate   Cancer Paternal Aunt        Lymphoma   Asthma Son    Obesity Son     Social History   Socioeconomic History   Marital status: Single    Spouse name: Not on file   Number of children: 1   Years of education: Not on file   Highest education level: Not on file  Occupational History    Employer: [employer]  Tobacco Use   Smoking status: Never   Smokeless tobacco: Never  Vaping  Use   Vaping Use: Never used  Substance and Sexual Activity   Alcohol use: Not Currently   Drug use: No   Sexual activity: Yes    Partners: Male    Birth control/protection: Injection    Comment: 1st intercourse 36 yo-5 partners-,   Other Topics Concern   Not on file  Social History Narrative   Regular exercise:  Not currently   Caffeine Use:  Sometimes   65 yr old son   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 6.7 g 3   amLODipine (NORVASC) 10  MG tablet Take 1 tablet (10 mg total) by mouth daily. 90 tablet 1   carvedilol (COREG) 25 MG tablet Take 1 tablet (25 mg total) by mouth 2 (two) times daily with a meal. 60 tablet 3   hydrochlorothiazide (HYDRODIURIL) 25 MG tablet Take 1 tablet (25 mg total) by mouth daily. 90 tablet 1   medroxyPROGESTERone (DEPO-PROVERA) 150 MG/ML injection Inject 1 mL (150 mg total) into the muscle every 3 (three) months. 1 mL 4   valsartan (DIOVAN) 160 MG tablet Take 1 tablet (160 mg total) by mouth daily. 30 tablet 3   meloxicam (MOBIC) 7.5 MG tablet Take 1 tablet (7.5 mg total) by mouth daily as needed for pain. 14 tablet 0   No facility-administered medications prior to visit.    No Known Allergies  Review of Systems  Neurological:  Negative for dizziness.       Objective:    Physical Exam Constitutional:      General: She is not in acute distress.    Appearance: Normal appearance. She  is not ill-appearing.  HENT:     Head: Normocephalic and atraumatic.     Right Ear: External ear normal.     Left Ear: External ear normal.  Eyes:     Extraocular Movements: Extraocular movements intact.     Pupils: Pupils are equal, round, and reactive to light.  Cardiovascular:     Rate and Rhythm: Normal rate and regular rhythm.     Heart sounds: Normal heart sounds. No murmur heard.    No gallop.  Pulmonary:     Effort: Pulmonary effort is normal. No respiratory distress.     Breath sounds: Normal breath sounds. No wheezing or rales.  Skin:    General: Skin is warm and dry.  Neurological:     Mental Status: She is alert and oriented to person, place, and time.  Psychiatric:        Judgment: Judgment normal.     BP 134/69 (BP Location: Right Arm, Patient Position: Sitting, Cuff Size: Large)   Pulse 82   Temp 98.5 F (36.9 C) (Oral)   Resp 16   Wt 297 lb (134.7 kg)   SpO2 100%   BMI 45.16 kg/m  Wt Readings from Last 3 Encounters:  04/13/22 297 lb (134.7 kg)  04/06/22 296 lb (134.3 kg)  03/23/22 294 lb 6 oz (133.5 kg)       Assessment & Plan:   Problem List Items Addressed This Visit       Unprioritized   HTN (hypertension) - Primary    BP Readings from Last 3 Encounters:  04/13/22 134/69  04/06/22 (!) 145/85  03/23/22 (!) 145/83  BP looks great and is at goal with addition of ARB. Will continue current meds and obtain follow up bmet.       Relevant Orders   Basic metabolic panel   Chronic midline low back pain without sciatica    Improved, wishes to continue prn meloxicam. Rx has been sent.       Relevant Medications   meloxicam (MOBIC) 7.5 MG tablet     Meds ordered this encounter  Medications   meloxicam (MOBIC) 7.5 MG tablet    Sig: Take 1 tablet (7.5 mg total) by mouth daily as needed for pain.    Dispense:  30 tablet    Refill:  1    Order Specific Question:   Supervising Provider    Answer:   Danise Edge A [[id number]]  I, Lemont Fillers, NP, personally preformed the services described in this documentation.  All medical record entries made by the scribe were at my direction and in my presence.  I have reviewed the chart and discharge instructions (if applicable) and agree that the record reflects my personal performance and is accurate and complete. 04/13/2022   I,[name],acting as a scribe for Lemont Fillers, NP.,have documented all relevant documentation on the behalf of Lemont Fillers, NP,as directed by  Lemont Fillers, NP while in the presence of Lemont Fillers, NP.   Lemont Fillers, NP

## 2022-04-13 NOTE — Assessment & Plan Note (Signed)
BP Readings from Last 3 Encounters:  04/13/22 134/69  04/06/22 (!) 145/85  03/23/22 (!) 145/83   BP looks great and is at goal with addition of ARB. Will continue current meds and obtain follow up bmet.

## 2022-04-13 NOTE — Assessment & Plan Note (Signed)
Improved, wishes to continue prn meloxicam. Rx has been sent.

## 2022-05-31 ENCOUNTER — Other Ambulatory Visit: Payer: Self-pay | Admitting: Family

## 2022-06-24 ENCOUNTER — Ambulatory Visit (INDEPENDENT_AMBULATORY_CARE_PROVIDER_SITE_OTHER): Payer: 59 | Admitting: *Deleted

## 2022-06-24 DIAGNOSIS — Z3042 Encounter for surveillance of injectable contraceptive: Secondary | ICD-10-CM

## 2022-06-24 MED ORDER — MEDROXYPROGESTERONE ACETATE 150 MG/ML IM SUSP
150.0000 mg | Freq: Once | INTRAMUSCULAR | Status: AC
  Administered 2022-06-24: 150 mg via INTRAMUSCULAR

## 2022-07-15 ENCOUNTER — Ambulatory Visit: Payer: 59 | Admitting: Family

## 2022-07-18 ENCOUNTER — Ambulatory Visit: Payer: 59 | Admitting: Family

## 2022-07-18 VITALS — BP 138/67 | HR 82 | Temp 98.3°F | Resp 16 | Ht 68.0 in | Wt 305.0 lb

## 2022-07-18 DIAGNOSIS — Z23 Encounter for immunization: Secondary | ICD-10-CM | POA: Diagnosis not present

## 2022-07-18 DIAGNOSIS — J45909 Unspecified asthma, uncomplicated: Secondary | ICD-10-CM

## 2022-07-18 DIAGNOSIS — G8929 Other chronic pain: Secondary | ICD-10-CM

## 2022-07-18 DIAGNOSIS — I10 Essential (primary) hypertension: Secondary | ICD-10-CM | POA: Diagnosis not present

## 2022-07-18 DIAGNOSIS — M545 Low back pain, unspecified: Secondary | ICD-10-CM | POA: Diagnosis not present

## 2022-07-18 MED ORDER — ALBUTEROL SULFATE HFA 108 (90 BASE) MCG/ACT IN AERS: INHALATION_SPRAY | RESPIRATORY_TRACT | 3 refills | Status: DC

## 2022-07-18 MED ORDER — HYDROCHLOROTHIAZIDE 25 MG PO TABS: 25.0000 mg | ORAL_TABLET | Freq: Every day | ORAL | 1 refills | Status: DC

## 2022-07-18 MED ORDER — AMLODIPINE BESYLATE 10 MG PO TABS: 10.0000 mg | ORAL_TABLET | Freq: Every day | ORAL | 1 refills | Status: DC

## 2022-07-18 MED ORDER — MELOXICAM 7.5 MG PO TABS: 7.5000 mg | ORAL_TABLET | Freq: Every day | ORAL | 1 refills | Status: DC | PRN

## 2022-07-18 MED ORDER — VALSARTAN 160 MG PO TABS: 160.0000 mg | ORAL_TABLET | Freq: Every day | ORAL | 5 refills | Status: DC

## 2022-07-18 MED ORDER — CARVEDILOL 25 MG PO TABS: 25.0000 mg | ORAL_TABLET | Freq: Two times a day (BID) | ORAL | 5 refills | Status: DC

## 2022-07-18 NOTE — Assessment & Plan Note (Signed)
Fair control on meloxicam.  Declines PT referral due to scheduling issues with her job.

## 2022-07-18 NOTE — Assessment & Plan Note (Signed)
BP Readings from Last 3 Encounters:  07/18/22 138/67  04/13/22 134/69  04/06/22 (!) 145/85   At goal on amlodipine, carvedilol, hctz and valsartan.  Continue same. Lab work up to date.l

## 2022-07-18 NOTE — Progress Notes (Signed)
Subjective:   By signing my name below, I, Amy Sutton, attest that this documentation has been prepared under the direction and in the presence of Amy Sutton, 07/18/2022.   Patient ID: Amy Sutton, female    DOB: 09/06/1980, 41 y.o.   MRN: [id number]  Chief Complaint  Patient presents with   Hypertension    Here for follow up    HPI Patient is in today for an office visit.  Hypertension Patients blood pressure is normal this visit. She is complaint with her 25 mg Coreg, 160 mg Diovan, 10 mg Norvasc, and 25 mg Hydrodiuril medications. BP Readings from Last 3 Encounters:  07/18/22 138/67  04/13/22 134/69  04/06/22 (!) 145/85   Pulse Readings from Last 3 Encounters:  07/18/22 82  04/13/22 82  04/06/22 74   Low back pain Patient reports that her 7.5 mg Meloxicam does not relieve her back pain. She states that she went to physical therapy a long time ago and does not want to do it now because she does not have time in her schedule.   Asthma Patient reports that she uses her albuterol 108 mcg/act inhaler once a week during her Zumba class.  Contraceptive Patient regularly sees her Ob/Gyn specialist, Dr. Seymour Bars, for her 150 mg/ml Depo-provera injection.  There are no preventive care reminders to display for this patient.   Past Medical History:  Diagnosis Date   Hypertension     Past Surgical History:  Procedure Laterality Date   Nexplanon     Inserted 11-2017    Family History  Problem Relation Age of Onset   Arthritis Mother    Hypertension Mother    Diabetes Mother    Hyperlipidemia Mother    Hypertension Father    Diabetes Father    Hyperlipidemia Father    Cancer Maternal Uncle        prostate   Cancer Paternal Aunt        Lymphoma   Asthma Son    Obesity Son     Social History   Socioeconomic History   Marital status: Single    Spouse name: Not on file   Number of children: 1   Years of education: Not on file   Highest  education level: Not on file  Occupational History    Employer: [employer]  Tobacco Use   Smoking status: Never   Smokeless tobacco: Never  Vaping Use   Vaping Use: Never used  Substance and Sexual Activity   Alcohol use: Not Currently   Drug use: No   Sexual activity: Yes    Partners: Male    Birth control/protection: Injection    Comment: 1st intercourse 43 yo-5 partners-,   Other Topics Concern   Not on file  Social History Narrative   Regular exercise:  Not currently   Caffeine Use:  Sometimes   64 yr old son   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 1 mL 4   albuterol (VENTOLIN HFA) 108 (90 Base) MCG/ACT inhaler 2 puffs every 6 hours as needed. 6.7 g 3   amLODipine (NORVASC) 10 MG tablet Take 1 tablet (10 mg total) by mouth daily. 90 tablet 1   carvedilol (COREG) 25 MG tablet Take 1 tablet (25 mg total) by mouth 2 (two) times daily with a meal. 60 tablet 3   hydrochlorothiazide (HYDRODIURIL) 25 MG tablet Take 1 tablet (25 mg total) by mouth daily. 90 tablet 1   meloxicam (MOBIC) 7.5 MG tablet TAKE 1 TABLET BY MOUTH DAILY AS NEEDED FOR PAIN 30 tablet 1   valsartan (DIOVAN) 160 MG tablet Take 1 tablet (160 mg total) by mouth daily. 30 tablet 3   No facility-administered medications prior to visit.    No Known Allergies  Review of Systems  Musculoskeletal:  Positive for  back pain (lower).       Objective:    Physical Exam Constitutional:      General: She is not in acute distress.    Appearance: Normal appearance. She is not ill-appearing.  HENT:     Head: Normocephalic and atraumatic.     Right Ear: External ear normal.     Left Ear: External ear normal.  Eyes:     Extraocular Movements: Extraocular movements intact.     Pupils: Pupils are equal, round, and reactive to light.  Cardiovascular:     Rate and Rhythm: Normal rate.  Pulmonary:     Effort: Pulmonary effort is normal.  Skin:    General: Skin is warm and dry.  Neurological:     Mental Status: She is alert and oriented to person, place, and time.  Psychiatric:        Mood and Affect: Mood normal.        Behavior: Behavior normal.        Judgment: Judgment normal.     BP 138/67 (BP Location: Right Arm, Patient Position: Sitting, Cuff Size: Large)   Pulse 82   Temp 98.3 F (36.8 C) (Oral)   Resp 16   Ht 5\' 8"  (1.727 m)   Wt (!) 305 lb (138.3 kg)   SpO2 100%   BMI 46.38 kg/m  Wt Readings from Last 3 Encounters:  07/18/22 (!) 305 lb (138.3 kg)  04/13/22 297 lb (134.7 kg)  04/06/22 296 lb (134.3 kg)       Assessment & Plan:   Problem List Items Addressed This Visit       Unprioritized   HTN (hypertension)    BP Readings from Last 3 Encounters:  07/18/22 138/67  04/13/22 134/69  04/06/22 (!) 145/85  At goal on amlodipine, carvedilol, hctz and valsartan.  Continue same. Lab work up to date.l      Relevant Medications   amLODipine (NORVASC) 10 MG tablet   valsartan (DIOVAN) 160 MG tablet   carvedilol (COREG) 25 MG tablet   hydrochlorothiazide (HYDRODIURIL) 25 MG tablet   Chronic midline low back pain without sciatica    Fair control on meloxicam.  Declines PT referral due to scheduling issues with her job.       Relevant Medications   meloxicam (MOBIC) 7.5 MG tablet   Asthma    Stable with use of prn albuterol about once a week. Monitor.       Relevant  Medications   albuterol (VENTOLIN HFA) 108 (90 Base) MCG/ACT  inhaler   Other Visit Diagnoses     Needs flu shot    -  Primary   Relevant Orders   Flu Vaccine QUAD 6+ mos PF IM (Fluarix Quad PF) (Completed)      Meds ordered this encounter  Medications   albuterol (VENTOLIN HFA) 108 (90 Base) MCG/ACT inhaler    Sig: 2 puffs every 6 hours as needed.    Dispense:  6.7 g    Refill:  3    Order Specific Question:   Supervising Provider    Answer:   Danise Edge A [[id number]]   amLODipine (NORVASC) 10 MG tablet    Sig: Take 1 tablet (10 mg total) by mouth daily.    Dispense:  90 tablet    Refill:  1    Order Specific Question:   Supervising Provider    Answer:   Danise Edge A [[id number]]   valsartan (DIOVAN) 160 MG tablet    Sig: Take 1 tablet (160 mg total) by mouth daily.    Dispense:  30 tablet    Refill:  5    Order Specific Question:   Supervising Provider    Answer:   Danise Edge A [[id number]]   carvedilol (COREG) 25 MG tablet    Sig: Take 1 tablet (25 mg total) by mouth 2 (two) times daily with a meal.    Dispense:  60 tablet    Refill:  5    Order Specific Question:   Supervising Provider    Answer:   Danise Edge A [[id number]]   hydrochlorothiazide (HYDRODIURIL) 25 MG tablet    Sig: Take 1 tablet (25 mg total) by mouth daily.    Dispense:  90 tablet    Refill:  1    Order Specific Question:   Supervising Provider    Answer:   Danise Edge A [[id number]]   meloxicam (MOBIC) 7.5 MG tablet    Sig: Take 1 tablet (7.5 mg total) by mouth daily as needed. for pain    Dispense:  90 tablet    Refill:  1    Order Specific Question:   Supervising Provider    Answer:   Renold Genta, personally preformed the services described in this documentation.  All medical record entries made by the scribe were at my direction and in my presence.  I have reviewed the chart and discharge instructions (if applicable) and agree that the record reflects my personal  performance and is accurate and complete. 07/18/2022.    I,Verona Buck,acting as a Neurosurgeon for [employer], NP.,have documented all relevant documentation on the behalf of Lemont Fillers, NP,as directed by  Lemont Fillers, NP while in the presence of Lemont Fillers, NP.      Lemont Fillers, NP

## 2022-07-18 NOTE — Assessment & Plan Note (Signed)
Stable with use of prn albuterol about once a week. Monitor.

## 2022-07-25 ENCOUNTER — Encounter: Payer: Self-pay | Admitting: Family

## 2022-09-09 ENCOUNTER — Ambulatory Visit (INDEPENDENT_AMBULATORY_CARE_PROVIDER_SITE_OTHER): Payer: 59

## 2022-09-09 DIAGNOSIS — Z3042 Encounter for surveillance of injectable contraceptive: Secondary | ICD-10-CM | POA: Diagnosis not present

## 2022-09-09 MED ORDER — MEDROXYPROGESTERONE ACETATE 150 MG/ML IM SUSP
150.0000 mg | Freq: Once | INTRAMUSCULAR | Status: AC
  Administered 2022-09-09: 150 mg via INTRAMUSCULAR

## 2022-09-24 ENCOUNTER — Other Ambulatory Visit: Payer: Self-pay | Admitting: Obstetrics & Gynecology

## 2022-09-24 DIAGNOSIS — Z3042 Encounter for surveillance of injectable contraceptive: Secondary | ICD-10-CM

## 2022-09-26 NOTE — Telephone Encounter (Signed)
Last AEX 11/05/2021--scheduled for 11/28/2022. No mammo seen.   Last depo received on 09/09/2022--will be due at time of annual exam.

## 2022-10-21 ENCOUNTER — Encounter: Payer: Self-pay | Admitting: Family

## 2022-10-21 ENCOUNTER — Ambulatory Visit (INDEPENDENT_AMBULATORY_CARE_PROVIDER_SITE_OTHER): Payer: 59 | Admitting: Family

## 2022-10-21 VITALS — BP 152/78 | HR 86 | Temp 97.7°F | Resp 16 | Ht 68.0 in | Wt 306.0 lb

## 2022-10-21 DIAGNOSIS — R739 Hyperglycemia, unspecified: Secondary | ICD-10-CM | POA: Diagnosis not present

## 2022-10-21 DIAGNOSIS — Z1231 Encounter for screening mammogram for malignant neoplasm of breast: Secondary | ICD-10-CM

## 2022-10-21 DIAGNOSIS — I10 Essential (primary) hypertension: Secondary | ICD-10-CM | POA: Diagnosis not present

## 2022-10-21 DIAGNOSIS — Z0001 Encounter for general adult medical examination with abnormal findings: Secondary | ICD-10-CM

## 2022-10-21 DIAGNOSIS — R635 Abnormal weight gain: Secondary | ICD-10-CM

## 2022-10-21 DIAGNOSIS — J45909 Unspecified asthma, uncomplicated: Secondary | ICD-10-CM | POA: Diagnosis not present

## 2022-10-21 DIAGNOSIS — Z0000 Encounter for general adult medical examination without abnormal findings: Secondary | ICD-10-CM

## 2022-10-21 LAB — LIPID PANEL
Cholesterol: 158 mg/dL (ref 0–200)
HDL: 39.8 mg/dL (ref >39.00)
LDL Cholesterol: 106 mg/dL — ABNORMAL HIGH (ref 0–99)
NonHDL: 118.11
Total CHOL/HDL Ratio: 4
Triglycerides: 62 mg/dL (ref 0.0–149.0)
VLDL: 12.4 mg/dL (ref 0.0–40.0)

## 2022-10-21 LAB — CBC WITH DIFFERENTIAL/PLATELET
Basophils Absolute: 0 10*3/uL (ref 0.0–0.1)
Basophils Relative: 0.4 % (ref 0.0–3.0)
Eosinophils Absolute: 0.1 10*3/uL (ref 0.0–0.7)
Eosinophils Relative: 1.4 % (ref 0.0–5.0)
HCT: 39.6 % (ref 36.0–46.0)
Hemoglobin: 12.8 g/dL (ref 12.0–15.0)
Lymphocytes Relative: 36.4 % (ref 12.0–46.0)
Lymphs Abs: 2.2 10*3/uL (ref 0.7–4.0)
MCHC: 32.3 g/dL (ref 30.0–36.0)
MCV: 84.1 fl (ref 78.0–100.0)
Monocytes Absolute: 0.4 10*3/uL (ref 0.1–1.0)
Monocytes Relative: 6.4 % (ref 3.0–12.0)
Neutro Abs: 3.4 10*3/uL (ref 1.4–7.7)
Neutrophils Relative %: 55.4 % (ref 43.0–77.0)
Platelets: 282 10*3/uL (ref 150.0–400.0)
RBC: 4.71 Mil/uL (ref 3.87–5.11)
RDW: 13.8 % (ref 11.5–15.5)
WBC: 6.1 10*3/uL (ref 4.0–10.5)

## 2022-10-21 LAB — TSH: TSH: 1.66 u[IU]/mL (ref 0.35–5.50)

## 2022-10-21 LAB — COMPREHENSIVE METABOLIC PANEL
ALT: 11 U/L (ref 0–35)
AST: 12 U/L (ref 0–37)
Albumin: 4 g/dL (ref 3.5–5.2)
Alkaline Phosphatase: 68 U/L (ref 39–117)
BUN: 10 mg/dL (ref 6–23)
CO2: 26 mEq/L (ref 19–32)
Calcium: 9.3 mg/dL (ref 8.4–10.5)
Chloride: 107 mEq/L (ref 96–112)
Creatinine, Ser: 0.74 mg/dL (ref 0.40–1.20)
GFR: 101.09 mL/min (ref >60.00)
Glucose, Bld: 94 mg/dL (ref 70–99)
Potassium: 3.9 mEq/L (ref 3.5–5.1)
Sodium: 141 mEq/L (ref 135–145)
Total Bilirubin: 0.5 mg/dL (ref 0.2–1.2)
Total Protein: 6.6 g/dL (ref 6.0–8.3)

## 2022-10-21 LAB — HEMOGLOBIN A1C: Hgb A1c MFr Bld: 5.4 % (ref 4.6–6.5)

## 2022-10-21 MED ORDER — VALSARTAN-HYDROCHLOROTHIAZIDE 320-25 MG PO TABS: 1.0000 | ORAL_TABLET | Freq: Every day | ORAL | 1 refills | Status: DC

## 2022-10-21 NOTE — Assessment & Plan Note (Addendum)
Wt Readings from Last 3 Encounters:  10/21/22 (!) 306 lb (138.8 kg)  07/18/22 (!) 305 lb (138.3 kg)  04/13/22 297 lb (134.7 kg)   Continues zumba 4 days a week.  Discussed improvements in diet.  Refer for mammogram, pap up to date.  She has had some weight gain on Depo-provera. I advised her to discuss this with her GYN.

## 2022-10-21 NOTE — Assessment & Plan Note (Signed)
Repeat A1C.

## 2022-10-21 NOTE — Assessment & Plan Note (Signed)
Uncontrolled. Will d/c hctz d/c 159m valsartan, start Valsartan-hctz 3233m25mg once daily. Continue amlodipine, carvedilol. Follow up in 2 weeks.

## 2022-10-21 NOTE — Assessment & Plan Note (Signed)
Stable, continue prn albuterol.

## 2022-10-21 NOTE — Progress Notes (Signed)
Subjective:   By signing my name below, I, Amy Sutton, attest that this documentation has been prepared under the direction and in the presence of Amy Alar, NP. 10/21/2022   Patient ID: Amy Sutton, female    DOB: 1981/05/11, 42 y.o.   MRN: [id number]  No chief complaint on file.   HPI Patient is in today for a comprehensive physical exam.   Blood pressure: Her blood pressure is elevated during this visit. She continues taking 10 mg Amlodipine daily PO, 25 mg carvedilol 2x daily PO, 25 mg hydrochlorothiazide daily PO, 160 mg Diovan daily PO and reports no new issues while taking them.  BP Readings from Last 3 Encounters:  10/21/22 (!) 152/78  07/18/22 138/67  04/13/22 134/69   Pulse Readings from Last 3 Encounters:  10/21/22 86  07/18/22 82  04/13/22 82   Headaches: She reports having a some headaches last week and thinks the change in weather contributed to it. She has no headaches at this time.   Breathing: Her breathing is stable at this time. She has albuterol with her incase her symptoms worsens but she has not needed it recently.   Birth control: She is using Depo injections for birth control at this time. She has not had a menstrual cycle recently while on depo.   Acute: She denies fever, unexpected weight change, adenopathy, new moles, sinus pain, sore throat, visual disturbance, chest pain, palpitations, leg swelling, cough, shortness of breath, wheezing, nausea, vomiting, diarrhea, constipation, blood in stool, dysuria, frequency, hematuria, new muscle pain, new joint pain, headaches, depression or anxiety at this time.   Social history: She does not drink alcohol. She does not use drugs. She does not use tobacco or vaping products.   Immunizations: She is UTD on tetanus vaccine. She is UTD on the latest flu vaccine. She does not have the latest Covid-19 vaccine and is interested in receiving it at her pharmacy.   Diet: She is not managing a healthy  diet at this time. Her lunch is typically provided by her workplace and she eats whatever is served. She drinks mostly water and occasionally drinks sweet tea.   Exercise: She is participating in regular exercise by participating in [employer] classes 4x weekly.   Pap Smear: Last completed on 11/05/2021. Results are normal. Due in 3 years.   Mammogram: She is interested in scheduling her appointment for a mammogram.   Dental: She is UTD on dental care.   Vision: She has an upcomming vision care appointment next month.    Past Medical History:  Diagnosis Date   Hypertension     Past Surgical History:  Procedure Laterality Date   Nexplanon     Inserted 11-2017    Family History  Problem Relation Age of Onset   Arthritis Mother    Hypertension Mother    Diabetes Mother    Hyperlipidemia Mother    Hypertension Father    Diabetes Father    Hyperlipidemia Father    Cancer Maternal Uncle        prostate   Cancer Paternal Aunt        Lymphoma   Asthma Son    Obesity Son     Social History   Socioeconomic History   Marital status: Single    Spouse name: Not on file   Number of children: 1   Years of education: Not on file   Highest education level: Not on file  Occupational History    Employer: [employer] services  Tobacco Use   Smoking status: Never   Smokeless tobacco: Never  Vaping Use   Vaping Use: Never used  Substance and Sexual Activity   Alcohol use: Not Currently   Drug use: No   Sexual activity: Yes    Partners: Male    Birth control/protection: Injection    Comment: 1st intercourse 89 yo-5 partners-,   Other Topics Concern   Not on file  Social History Narrative   Regular exercise:  Not currently   Caffeine Use:  Sometimes   23 yr old son   Masters in Engineer, agricultural   Lives with parents and son   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 6.7 g 3   amLODipine (NORVASC) 10 MG tablet Take 1 tablet (10 mg total) by mouth daily. 90 tablet 1   carvedilol (COREG) 25 MG tablet Take 1 tablet (25 mg total) by mouth 2 (two) times daily with a meal. 60 tablet 5   medroxyPROGESTERone (DEPO-PROVERA) 150 MG/ML injection INJECT 1 ML (150 MG TOTAL) INTO THE MUSCLE EVERY 3 (THREE) MONTHS 1 mL 0   meloxicam (MOBIC) 7.5 MG tablet Take 1 tablet (7.5 mg total) by mouth daily as needed. for pain 90 tablet 1   hydrochlorothiazide (HYDRODIURIL) 25 MG tablet Take 1 tablet (25 mg total) by mouth daily. 90 tablet 1   valsartan (DIOVAN) 160 MG tablet Take 1 tablet (160 mg total) by mouth daily. 30 tablet 5   No facility-administered medications prior to visit.    No Known Allergies  Review of Systems  Constitutional:  Negative for fever.       (-)unexpected weight change  HENT:  Negative for sinus pain and sore throat.   Eyes:        (-)visual disturbance  Respiratory:  Negative for cough, shortness of breath and wheezing.   Cardiovascular:  Negative for chest pain, palpitations and leg swelling.  Gastrointestinal:  Negative for blood in stool, constipation, diarrhea, nausea and vomiting.  Genitourinary:  Negative for dysuria, frequency and hematuria.  Musculoskeletal:        (-) new muscle pain (-)new joint pain  Skin:        (-) new moles  Neurological:  Negative for headaches.  Psychiatric/Behavioral:  Negative for depression.        (-) anxiety       Objective:    Physical Exam Constitutional:      Appearance: Normal appearance.  HENT:      Head: Normocephalic and atraumatic.     Right Ear: Tympanic membrane, ear canal and external ear normal.     Left Ear: Tympanic membrane, ear canal and external ear normal.     Mouth/Throat:     Mouth: Mucous membranes are moist.     Pharynx: Oropharynx is clear. No oropharyngeal exudate or posterior oropharyngeal erythema.  Eyes:     Extraocular Movements: Extraocular movements intact.  Right eye: No nystagmus.     Left eye: No nystagmus.     Pupils: Pupils are equal, round, and reactive to light.  Cardiovascular:     Rate and Rhythm: Normal rate and regular rhythm.     Heart sounds: Normal heart sounds. No murmur heard.    No gallop.     Comments: Her blood pressure measured 152/78 during manual recheck Pulmonary:     Effort: Pulmonary effort is normal. No respiratory distress.     Breath sounds: Normal breath sounds. No wheezing or rales.  Chest:  Breasts:    Right: Normal.     Left: Normal.  Abdominal:     General: Bowel sounds are normal. There is no distension.     Palpations: Abdomen is soft.     Tenderness: There is no abdominal tenderness. There is no guarding.  Musculoskeletal:     Comments: (+) 5/5 strength in both upper and lower extremities  Lymphadenopathy:     Cervical: No cervical adenopathy.  Skin:    General: Skin is warm.  Neurological:     Mental Status: She is alert and oriented to person, place, and time.     Deep Tendon Reflexes:     Reflex Scores:      Patellar reflexes are 2+ on the right side and 2+ on the left side. Psychiatric:        Judgment: Judgment normal.     BP (!) 152/78   Pulse 86   Temp 97.7 F (36.5 C) (Oral)   Resp 16   Ht 5' 8"$  (1.727 m)   Wt (!) 306 lb (138.8 kg)   SpO2 100%   BMI 46.53 kg/m  Wt Readings from Last 3 Encounters:  10/21/22 (!) 306 lb (138.8 kg)  07/18/22 (!) 305 lb (138.3 kg)  04/13/22 297 lb (134.7 kg)       Assessment & Plan:  Routine general medical examination at a health care  facility Assessment & Plan: Wt Readings from Last 3 Encounters:  10/21/22 (!) 306 lb (138.8 kg)  07/18/22 (!) 305 lb (138.3 kg)  04/13/22 297 lb (134.7 kg)   Continues zumba 4 days a week.  Discussed improvements in diet.  Refer for mammogram, pap up to date.  She has had some weight gain on Depo-provera. I advised her to discuss this with her GYN.   Orders: -     Lipid panel -     CBC with Differential/Platelet  Hyperglycemia Assessment & Plan: Repeat A1C.   Orders: -     Hemoglobin A1c  Breast cancer screening by mammogram -     3D Screening Mammogram, Left and Right; Future  Uncomplicated asthma, unspecified asthma severity, unspecified whether persistent Assessment & Plan: Stable, continue prn albuterol.    Primary hypertension Assessment & Plan: Uncontrolled. Will d/c hctz d/c 137m valsartan, start Valsartan-hctz 3273m25mg once daily. Continue amlodipine, carvedilol. Follow up in 2 weeks.   Orders: -     Comprehensive metabolic panel  Morbid [place]     Lipid panel  Weight gain -     TSH  Other orders -     Valsartan-hydroCHLOROthiazide; Take 1 tablet by mouth daily.  Dispense: 90 tablet; Refill: 1    I, MeNance PearNP, personally preformed the services described in this documentation.  All medical record entries made by the scribe were at my direction and in my presence.  I have reviewed the chart and discharge instructions (if applicable) and agree  that the record reflects my personal performance and is accurate and complete. 10/21/2022  I,Amy Sutton,acting as a scribe for Nance Pear, NP.,have documented all relevant documentation on the behalf of Nance Pear, NP,as directed by  Nance Pear, NP while in the presence of Nance Pear, NP.   Nance Pear, NP

## 2022-11-08 ENCOUNTER — Ambulatory Visit: Payer: 59 | Admitting: Family

## 2022-11-08 VITALS — BP 127/63 | HR 85 | Temp 98.3°F | Resp 16 | Wt 302.0 lb

## 2022-11-08 DIAGNOSIS — I10 Essential (primary) hypertension: Secondary | ICD-10-CM

## 2022-11-08 LAB — BASIC METABOLIC PANEL
BUN: 12 mg/dL (ref 6–23)
CO2: 24 mEq/L (ref 19–32)
Calcium: 9.5 mg/dL (ref 8.4–10.5)
Chloride: 107 mEq/L (ref 96–112)
Creatinine, Ser: 0.76 mg/dL (ref 0.40–1.20)
GFR: 97.87 mL/min (ref >60.00)
Glucose, Bld: 91 mg/dL (ref 70–99)
Potassium: 3.9 mEq/L (ref 3.5–5.1)
Sodium: 140 mEq/L (ref 135–145)

## 2022-11-08 NOTE — Assessment & Plan Note (Signed)
BP is improved and at goal.  Continue Diovant hct, carvedilol and amlodipine.  BP Readings from Last 3 Encounters:  11/08/22 127/63  10/21/22 (!) 152/78  07/18/22 138/67

## 2022-11-08 NOTE — Progress Notes (Signed)
Subjective:   By signing my name below, I, Amy Sutton, attest that this documentation has been prepared under the direction and in the presence of Nance Pear, NP 11/08/22   Patient ID: Amy Sutton, female    DOB: 1981-08-12, 42 y.o.   MRN: [id number]  Chief Complaint  Patient presents with   Hypertension    Here for follow up after medication changed     HPI Patient is in today for a 2 week follow up  Blood pressure: Last visit we changed from losartan, HCTZ to Valsartan HCTZ. She has been managing her hypertension well. She is compliant with her Valsartan once daily, Amlodipine 10 mg daily, and Carvedilol 25 mg twice daily.   Past Medical History:  Diagnosis Date   Hypertension     Past Surgical History:  Procedure Laterality Date   Nexplanon     Inserted 11-2017    Family History  Problem Relation Age of Onset   Arthritis Mother    Hypertension Mother    Diabetes Mother    Hyperlipidemia Mother    Hypertension Father    Diabetes Father    Hyperlipidemia Father    Cancer Maternal Uncle        prostate   Cancer Paternal Aunt        Lymphoma   Asthma Son    Obesity Son     Social History   Socioeconomic History   Marital status: Single    Spouse name: Not on file   Number of children: 1   Years of education: Not on file   Highest education level: Not on file  Occupational History    Employer: [employer]  Tobacco Use   Smoking status: Never   Smokeless tobacco: Never  Vaping Use   Vaping Use: Never used  Substance and Sexual Activity   Alcohol use: Not Currently   Drug use: No   Sexual activity: Yes    Partners: Male    Birth control/protection: Injection    Comment: 1st intercourse 75 yo-5 partners-,   Other Topics Concern   Not on file  Social History Narrative   Regular exercise:  Not currently   Caffeine Use:  Sometimes   75 yr old son   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 6.7 g 3   amLODipine (NORVASC) 10 MG tablet Take 1 tablet (10 mg total) by mouth daily. 90 tablet 1   carvedilol (COREG) 25 MG tablet Take 1 tablet (25 mg total) by mouth 2 (two) times daily with a meal. 60 tablet 5   medroxyPROGESTERone (DEPO-PROVERA) 150 MG/ML injection INJECT 1 ML (150 MG TOTAL) INTO THE MUSCLE EVERY 3 (THREE) MONTHS 1 mL 0   meloxicam (MOBIC) 7.5 MG tablet Take 1 tablet (7.5 mg total) by mouth daily as needed. for pain 90 tablet 1  valsartan-hydrochlorothiazide (DIOVAN-HCT) 320-25 MG tablet Take 1 tablet by mouth daily. 90 tablet 1   No facility-administered medications prior to visit.    No Known Allergies  ROS See HPI    Objective:    Physical Exam Constitutional:      General: She is awake. She is not in acute distress.    Appearance: Normal appearance. She is well-developed.  HENT:     Head: Normocephalic and atraumatic.     Right Ear: External ear normal.     Left Ear: External ear normal.  Eyes:     General: No scleral icterus. Neck:     Thyroid: No thyromegaly.  Cardiovascular:     Rate and Rhythm: Normal rate and regular rhythm.     Heart sounds: Normal heart sounds. No murmur heard. Pulmonary:     Effort: Pulmonary effort is normal. No respiratory distress.     Breath sounds: Normal breath sounds. No wheezing.  Musculoskeletal:     Cervical back: Neck supple.  Skin:    General: Skin is warm and dry.  Neurological:      Mental Status: She is alert and oriented to person, place, and time.     Cranial Nerves: No facial asymmetry.  Psychiatric:        Mood and Affect: Mood normal.        Speech: Speech normal.        Behavior: Behavior normal.        Thought Content: Thought content normal.        Judgment: Judgment normal.     BP 127/63 (BP Location: Right Arm, Patient Position: Sitting, Cuff Size: Large)   Pulse 85   Temp 98.3 F (36.8 C) (Oral)   Resp 16   Wt (!) 302 lb (137 kg)   SpO2 100%   BMI 45.92 kg/m  Wt Readings from Last 3 Encounters:  11/08/22 (!) 302 lb (137 kg)  10/21/22 (!) 306 lb (138.8 kg)  07/18/22 (!) 305 lb (138.3 kg)       Assessment & Plan:  Primary hypertension Assessment & Plan: BP is improved and at goal.  Continue Diovant hct, carvedilol and amlodipine.  BP Readings from Last 3 Encounters:  11/08/22 127/63  10/21/22 (!) 152/78  07/18/22 138/67     Orders: -     Basic metabolic panel      I,Rachel Rivera,acting as a scribe for Nance Pear, NP.,have documented all relevant documentation on the behalf of Nance Pear, NP,as directed by  Nance Pear, NP while in the presence of Nance Pear, NP.   I, Nance Pear, NP, personally preformed the services described in this documentation.  All medical record entries made by the scribe were at my direction and in my presence.  I have reviewed the chart and discharge instructions (if applicable) and agree that the record reflects my personal performance and is accurate and complete. 11/08/22   Nance Pear, NP

## 2022-11-10 ENCOUNTER — Ambulatory Visit: Payer: 59 | Admitting: Obstetrics & Gynecology

## 2022-11-24 ENCOUNTER — Encounter: Payer: Self-pay | Admitting: Obstetrics & Gynecology

## 2022-11-28 ENCOUNTER — Encounter: Payer: Self-pay | Admitting: Obstetrics & Gynecology

## 2022-11-28 ENCOUNTER — Ambulatory Visit (HOSPITAL_BASED_OUTPATIENT_CLINIC_OR_DEPARTMENT_OTHER)
Admission: RE | Admit: 2022-11-28 | Discharge: 2022-11-28 | Disposition: A | Discharge status: Home / Self Care | Payer: 59 | Source: Ambulatory Visit | Attending: Family | Admitting: Family

## 2022-11-28 ENCOUNTER — Encounter (HOSPITAL_BASED_OUTPATIENT_CLINIC_OR_DEPARTMENT_OTHER): Payer: Self-pay

## 2022-11-28 ENCOUNTER — Ambulatory Visit: Payer: 59

## 2022-11-28 ENCOUNTER — Ambulatory Visit (INDEPENDENT_AMBULATORY_CARE_PROVIDER_SITE_OTHER): Payer: 59 | Admitting: Obstetrics & Gynecology

## 2022-11-28 ENCOUNTER — Other Ambulatory Visit (HOSPITAL_COMMUNITY)
Admission: RE | Admit: 2022-11-28 | Discharge: 2022-11-28 | Disposition: A | Discharge status: Home / Self Care | Payer: 59 | Source: Ambulatory Visit | Attending: Obstetrics & Gynecology | Admitting: Obstetrics & Gynecology

## 2022-11-28 VITALS — BP 122/84 | HR 82 | Ht 68.25 in | Wt 302.0 lb

## 2022-11-28 DIAGNOSIS — Z1231 Encounter for screening mammogram for malignant neoplasm of breast: Secondary | ICD-10-CM | POA: Diagnosis not present

## 2022-11-28 DIAGNOSIS — Z308 Encounter for other contraceptive management: Secondary | ICD-10-CM | POA: Diagnosis not present

## 2022-11-28 DIAGNOSIS — Z6841 Body Mass Index (BMI) 40.0 and over, adult: Secondary | ICD-10-CM | POA: Diagnosis not present

## 2022-11-28 DIAGNOSIS — Z01419 Encounter for gynecological examination (general) (routine) without abnormal findings: Secondary | ICD-10-CM | POA: Insufficient documentation

## 2022-11-28 NOTE — Progress Notes (Signed)
Amy Sutton April 09, 1981 [id number]   History:    42 y.o. G1P1L1 Single/Stable partner   RP:  Established patient presenting for annual gyn exam and Nexplanon removal   HPI: Well on Depo Provera x last year, but would like to change to an IUD d/t HBP.  Obesity is another factor which could be helped by stopping the Depo Provera.  Was due for injection today, but will not give.  Will f/u for the Mirena IUD insertion.  No pelvic pain.  Sexually active.  Pap Neg/HPV Neg 11/2021. Repeat Pap reflex today.  Declines STI screen.  Breasts normal. Mammo done today. BMI 45.58. Planning to improve diet and exercise more. Health labs with Fam MD.  Past medical history,surgical history, family history and social history were all reviewed and documented in the EPIC chart.  Gynecologic History No LMP recorded. Patient has had an injection.  Obstetric History OB History  Gravida Para Term Preterm AB Living  1 1       1   SAB IAB Ectopic Multiple Live Births               # Outcome Date GA Lbr Len/2nd Weight Sex Delivery Anes PTL Lv  1 Para              ROS: A ROS was performed and pertinent positives and negatives are included in the history. GENERAL: No fevers or chills. HEENT: No change in vision, no earache, sore throat or sinus congestion. NECK: No pain or stiffness. CARDIOVASCULAR: No chest pain or pressure. No palpitations. PULMONARY: No shortness of breath, cough or wheeze. GASTROINTESTINAL: No abdominal pain, nausea, vomiting or diarrhea, melena or bright red blood per rectum. GENITOURINARY: No urinary frequency, urgency, hesitancy or dysuria. MUSCULOSKELETAL: No joint or muscle pain, no back pain, no recent trauma. DERMATOLOGIC: No rash, no itching, no lesions. ENDOCRINE: No polyuria, polydipsia, no heat or cold intolerance. No recent change in weight. HEMATOLOGICAL: No anemia or easy bruising or bleeding. NEUROLOGIC: No headache, seizures, numbness, tingling or weakness. PSYCHIATRIC: No  depression, no loss of interest in normal activity or change in sleep pattern.     Exam:   BP 122/84   Pulse 82   Ht 5' 8.25" (1.734 m)   Wt (!) 302 lb (137 kg)   SpO2 97%   BMI 45.58 kg/m   Body mass index is 45.58 kg/m.  General appearance : Well developed well nourished female. No acute distress HEENT: Eyes: no retinal hemorrhage or exudates,  Neck supple, trachea midline, no carotid bruits, no thyroidmegaly Lungs: Clear to auscultation, no rhonchi or wheezes, or rib retractions  Heart: Regular rate and rhythm, no murmurs or gallops Breast:Examined in sitting and supine position were symmetrical in appearance, no palpable masses or tenderness,  no skin retraction, no nipple inversion, no nipple discharge, no skin discoloration, no axillary or supraclavicular lymphadenopathy Abdomen: no palpable masses or tenderness, no rebound or guarding Extremities: no edema or skin discoloration or tenderness  Pelvic: Vulva: Normal             Vagina: No gross lesions or discharge  Cervix: No gross lesions or discharge.  Pap reflex done.  Uterus  AV, normal size, shape and consistency, non-tender and mobile  Adnexa  Without masses or tenderness  Anus: Normal   Assessment/Plan:  42 y.o. female for annual exam   1. Encounter for routine gynecological examination with Papanicolaou smear of cervix Well on Depo Provera x last year, but would like to  change to an IUD d/t HBP.  Obesity is another factor which could be helped by stopping the Depo Provera.  Was due for injection today, but will not give.  Will f/u for the Mirena IUD insertion.  No pelvic pain.  Sexually active.  Pap Neg/HPV Neg 11/2021. Repeat Pap reflex today.  Declines STI screen.  Breasts normal. Mammo done today. BMI 45.58. Planning to improve diet and exercise more. Health labs with Fam MD. - Cytology - PAP( [place])  2. Encounter for other contraceptive management Well on Depo Provera x last year, but would like to  change to an IUD d/t HBP.  Obesity is another factor which could be helped by stopping the Depo Provera.  Was due for injection today, but will not give.  Counseling on IUDs including ParaGard and Progesterone IUD. Decision to use a Progesterone IUD to better control the menstrual flow.  IUD insertion reviewed. Will f/u for the Mirena IUD insertion.  Strict condom use until insertion.  UPT just before insertion. - IUD Insertion; Future  3. Class 3 severe obesity due to excess calories with serious comorbidity and body mass index (BMI) of 45.0 to 49.9 in adult [facility])  Stop Depo Provera.  Lower calorie/carb diet.  Fitness activities.  Princess Bruins MD, 2:13 PM

## 2022-11-30 LAB — CYTOLOGY - PAP: Diagnosis: NEGATIVE

## 2022-12-01 ENCOUNTER — Telehealth: Payer: Self-pay

## 2022-12-01 ENCOUNTER — Other Ambulatory Visit: Payer: Self-pay

## 2022-12-01 MED ORDER — METRONIDAZOLE 0.75 % VA GEL: VAGINAL | 0 refills | Status: DC

## 2022-12-01 NOTE — Telephone Encounter (Signed)
She is better using it to treat before IUD insertion. Dr Dellis Filbert

## 2022-12-01 NOTE — Telephone Encounter (Signed)
Last dose of Metrogel is Monday night and IUD insertion Tuesday morning. Just confirming that is fine?

## 2022-12-01 NOTE — Telephone Encounter (Signed)
Zero problem :) Dr Hildred Priest with patient and informed her.

## 2022-12-01 NOTE — Telephone Encounter (Signed)
Patient was prescribed  Metrogel today x 5 nights. She is coming Tuesday 4/2 morning to have IUD inserted.  She questioned if she should not use this now if it might interfere with her appointment?

## 2022-12-06 ENCOUNTER — Encounter: Payer: Self-pay | Admitting: Obstetrics & Gynecology

## 2022-12-06 ENCOUNTER — Ambulatory Visit: Payer: 59 | Admitting: Obstetrics & Gynecology

## 2022-12-06 VITALS — BP 110/76 | HR 80

## 2022-12-06 DIAGNOSIS — Z30430 Encounter for insertion of intrauterine contraceptive device: Secondary | ICD-10-CM | POA: Diagnosis not present

## 2022-12-06 DIAGNOSIS — Z01812 Encounter for preprocedural laboratory examination: Secondary | ICD-10-CM

## 2022-12-06 LAB — PREGNANCY, URINE: Preg Test, Ur: NEGATIVE

## 2022-12-06 MED ORDER — LEVONORGESTREL 20 MCG/DAY IU IUD
1.0000 | INTRAUTERINE_SYSTEM | Freq: Once | INTRAUTERINE | Status: AC
  Administered 2022-12-06: 1 via INTRAUTERINE

## 2022-12-06 NOTE — Progress Notes (Signed)
    Amy Sutton 10-27-1980 [id number]        41 y.o.  G1P1001   RP: Mirena IUD insertion  HPI: Well on Depo Provera x last year, but would like to change to an IUD d/t HBP.  Obesity is another factor which could be helped by stopping the Depo Provera.  Was due for injection 11/28/2022, but not given. Mirena IUD insertion today.  UPT Neg.  No pelvic pain.  Sexually active.    OB History  Gravida Para Term Preterm AB Living  1 1 1     1   SAB IAB Ectopic Multiple Live Births               # Outcome Date GA Lbr Len/2nd Weight Sex Delivery Anes PTL Lv  1 Term             Past medical history,surgical history, problem list, medications, allergies, family history and social history were all reviewed and documented in the EPIC chart.   Directed ROS with pertinent positives and negatives documented in the history of present illness/assessment and plan.  Exam:  Vitals:   12/06/22 1026  BP: 110/76  Pulse: 80  SpO2: 98%   General appearance:  Normal                                                                    IUD procedure note       Patient presented to the office today for placement of Mirena IUD. The patient had previously been provided with literature information on this method of contraception. The risks benefits and pros and cons were discussed and all her questions were answered. She is fully aware that this form of contraception is 99% effective and is good for 8 years.  Pelvic exam: Vulva normal Vagina: No lesions or discharge Cervix: No lesions or discharge Uterus: AV position Adnexa: No masses or tenderness Rectal exam: Not done  The cervix was cleansed with Betadine solution. Hurricane spray on the cervix.  A single-tooth tenaculum was placed on the anterior cervical lip. Os finder used. The IUD was shown to the patient and inserted in a sterile fashion.  Hysterometry with the IUD as being inserted was 7 cm.  The IUD strings were trimmed. The single-tooth  tenaculum was removed. Patient was instructed to return back to the office in one month for follow up.        Assessment/Plan:  43 y.o. G1P1001   1. Encounter for insertion of intrauterine contraceptive device (IUD) Well on Depo Provera x last year, but would like to change to an IUD d/t HBP.  Obesity is another factor which could be helped by stopping the Depo Provera.  Was due for injection 11/28/2022, but not given. Mirena IUD insertion today.  UPT Neg.  No pelvic pain.  Sexually active.  Mirena IUD insertion after written informed consent.  Easy insertion with no Cx.  Well tolerated.  Post procedure precautions.  F/U 4 weeks for IUD check. - IUD Insertion  2. Encounter for preprocedural laboratory examination UPT Neg - Pregnancy, urine   Princess Bruins MD, 11:02 AM 12/06/2022

## 2022-12-06 NOTE — Addendum Note (Signed)
Addended by: Susy Manor on: 12/06/2022 11:27 AM   Modules accepted: Orders

## 2022-12-25 ENCOUNTER — Other Ambulatory Visit: Payer: Self-pay | Admitting: Obstetrics & Gynecology

## 2022-12-25 DIAGNOSIS — Z3042 Encounter for surveillance of injectable contraceptive: Secondary | ICD-10-CM

## 2023-01-05 ENCOUNTER — Ambulatory Visit: Payer: 59 | Admitting: Obstetrics & Gynecology

## 2023-01-11 ENCOUNTER — Encounter: Payer: Self-pay | Admitting: Obstetrics & Gynecology

## 2023-01-11 ENCOUNTER — Ambulatory Visit (INDEPENDENT_AMBULATORY_CARE_PROVIDER_SITE_OTHER): Payer: 59 | Admitting: Obstetrics & Gynecology

## 2023-01-11 VITALS — BP 118/80 | HR 80

## 2023-01-11 DIAGNOSIS — L292 Pruritus vulvae: Secondary | ICD-10-CM | POA: Diagnosis not present

## 2023-01-11 DIAGNOSIS — Z30431 Encounter for routine checking of intrauterine contraceptive device: Secondary | ICD-10-CM

## 2023-01-11 LAB — WET PREP FOR TRICH, YEAST, CLUE

## 2023-01-11 MED ORDER — FLUCONAZOLE 150 MG PO TABS: 150.0000 mg | ORAL_TABLET | ORAL | 0 refills | Status: AC

## 2023-01-11 NOTE — Progress Notes (Signed)
    Amy Sutton 05-Apr-1981 [id number]        42 y.o.  G1P1001   RP: Mirena IUD check at 4 weeks  HPI: Mirena IUD check at 4 weeks.  C/O mild vulvar itching. Mildly improved post Monistat.  No BTB.  No pelvic pain.  No fever.   OB History  Gravida Para Term Preterm AB Living  1 1 1     1   SAB IAB Ectopic Multiple Live Births               # Outcome Date GA Lbr Len/2nd Weight Sex Delivery Anes PTL Lv  1 Term             Past medical history,surgical history, problem list, medications, allergies, family history and social history were all reviewed and documented in the EPIC chart.   Directed ROS with pertinent positives and negatives documented in the history of present illness/assessment and plan.  Exam:  Vitals:   01/11/23 1059  BP: 118/80  Pulse: 80  SpO2: 99%   General appearance:  Normal  Gynecologic exam: Vulva normal.  Speculum:  Cervix/vagina normal.  IUD strings visible at EO.  Mild vaginal discharge.  Wet prep done.  Wet Prep:  Yeasts present   Assessment/Plan:  42 y.o. G1P1001   1. Encounter for routine checking of intrauterine contraceptive device (IUD) Mirena IUD check at 4 weeks.  C/O mild vulvar itching. Mildly improved post Monistat.  No BTB.  No pelvic pain.  No fever.  IUD in good position, well tolerated, no sign of infection.  Reassured.  2. Vulvar itching Yeast vaginitis on wet prep.  Will treat with Fluconazole 1 tab PO every other day x 3.  Usage reviewed, prescription sent to pharmacy. - WET PREP FOR TRICH, YEAST, CLUE   Other orders - fluconazole (DIFLUCAN) 150 MG tablet; Take 1 tablet (150 mg total) by mouth every other day for 3 doses.   Genia Del MD, 11:13 AM 01/11/2023

## 2023-01-20 ENCOUNTER — Other Ambulatory Visit: Payer: Self-pay | Admitting: Family

## 2023-02-08 ENCOUNTER — Ambulatory Visit: Payer: 59 | Admitting: Family

## 2023-02-08 VITALS — BP 128/72 | HR 88 | Temp 98.0°F | Resp 18 | Ht 68.5 in | Wt 300.0 lb

## 2023-02-08 DIAGNOSIS — I10 Essential (primary) hypertension: Secondary | ICD-10-CM

## 2023-02-08 DIAGNOSIS — M545 Low back pain, unspecified: Secondary | ICD-10-CM | POA: Diagnosis not present

## 2023-02-08 DIAGNOSIS — J45909 Unspecified asthma, uncomplicated: Secondary | ICD-10-CM | POA: Diagnosis not present

## 2023-02-08 DIAGNOSIS — G8929 Other chronic pain: Secondary | ICD-10-CM | POA: Diagnosis not present

## 2023-02-08 MED ORDER — AMLODIPINE BESYLATE 10 MG PO TABS: 10.0000 mg | ORAL_TABLET | Freq: Every day | ORAL | 1 refills | Status: DC

## 2023-02-08 MED ORDER — VALSARTAN-HYDROCHLOROTHIAZIDE 320-25 MG PO TABS: 1.0000 | ORAL_TABLET | Freq: Every day | ORAL | 1 refills | Status: DC

## 2023-02-08 NOTE — Progress Notes (Signed)
Subjective:     Patient ID: Amy Sutton, female    DOB: 1980/09/27, 42 y.o.   MRN: [id number]  Chief Complaint  Patient presents with   Follow-up    3 month    HPI  Discussed the use of AI scribe software for clinical note transcription with the patient, who gave verbal consent to proceed.  History of Present Illness   The patient, with a history of hypertension, asthma, and lower back pain, presents for a routine follow-up. She reports persistent lower back pain, localized to the left side, which has not improved with meloxicam. She has not undergone physical therapy for this issue in several years, but has been given exercises to perform at home, which she is currently doing.  The patient's hypertension is well-controlled on valsartan-hydrochlorothiazide, carvedilol and amlodipine, with a current blood pressure of 128/72. Her asthma is also well-managed, and she has not needed to use her albuterol inhaler in several months.  The patient also mentions occasional lower extremity swelling, which she attributes to prolonged sitting at work. She uses compression socks to manage this symptom. She has recently switched from Depo-Provera to Mirena for contraception, which she reports liking so far.          There are no preventive care reminders to display for this patient.  Past Medical History:  Diagnosis Date   Hypertension     Past Surgical History:  Procedure Laterality Date   Nexplanon     Inserted 11-2017    Family History  Problem Relation Age of Onset   Arthritis Mother    Hypertension Mother    Diabetes Mother    Hyperlipidemia Mother    Hypertension Father    Diabetes Father    Hyperlipidemia Father    Cancer Maternal Uncle        prostate   Cancer Paternal Aunt        Lymphoma   Asthma Son    Obesity Son     Social History   Socioeconomic History   Marital status: Single    Spouse name: Not on file   Number of children: 1   Years of  education: Not on file   Highest education level: Not on file  Occupational History    Employer: [employer]  Tobacco Use   Smoking status: Never   Smokeless tobacco: Never  Vaping Use   Vaping Use: Never used  Substance and Sexual Activity   Alcohol use: Not Currently   Drug use: No   Sexual activity: Yes    Partners: Male    Birth control/protection: I.U.D.    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 6.7 g 3   carvedilol (COREG) 25 MG tablet TAKE 1 TABLET (25 MG TOTAL) BY MOUTH TWICE A DAY WITH MEALS 60 tablet 5   levonorgestrel (MIRENA) 20 MCG/DAY IUD 1 each by Intrauterine route once.     amLODipine (NORVASC) 10 MG tablet Take 1 tablet (10 mg total) by mouth daily. 90 tablet 1   meloxicam (MOBIC) 7.5 MG tablet Take 1 tablet (7.5 mg total) by mouth daily as needed. for pain 90 tablet 1   valsartan-hydrochlorothiazide (DIOVAN-HCT) 320-25 MG tablet Take 1 tablet by mouth daily. 90 tablet 1   No facility-administered medications prior to visit.    No Known Allergies  ROS     Objective:    Physical Exam Constitutional:      General: She is not in acute distress.    Appearance: Normal appearance. She is  well-developed.  HENT:     Head: Normocephalic and atraumatic.     Right Ear: External ear normal.     Left Ear: External ear normal.  Eyes:     General: No scleral icterus. Neck:     Thyroid: No thyromegaly.  Cardiovascular:     Rate and Rhythm: Normal rate and regular rhythm.     Heart sounds: Normal heart sounds. No murmur heard. Pulmonary:     Effort: Pulmonary effort is normal. No respiratory distress.     Breath sounds: Normal breath sounds. No wheezing.  Musculoskeletal:     Cervical back: Neck supple.     Right lower leg: 1+ Edema present.     Left lower leg: 1+ Edema present.  Skin:    General: Skin is warm and dry.  Neurological:     Mental Status: She is alert and oriented to person, place, and time.  Psychiatric:        Mood and Affect: Mood normal.        Behavior: Behavior normal.        Thought Content: Thought content normal.        Judgment: Judgment normal.      BP 128/72   Pulse 88   Temp 98 F (36.7 C)   Resp 18   Ht 5' 8.5" (1.74 m)   Wt 300 lb (136.1 kg)   SpO2 100%   BMI 44.95 kg/m  Wt Readings from Last 3 Encounters:  02/08/23 300 lb (136.1 kg)  11/28/22 (!) 302 lb (137 kg)  11/08/22 (!) 302 lb (137 kg)       Assessment & Plan:   Problem List Items Addressed This Visit       Unprioritized   HTN (hypertension)     Hypertension: Well-controlled on Valsartan/Hydrochlorothiazide and Amlodipine. Blood pressure at today's visit was 128/72. -Continue current antihypertensive regimen. -Check blood pressure at next visit. -had normal BMET in the end of March.      Relevant Medications   valsartan-hydrochlorothiazide (DIOVAN-HCT) 320-25 MG tablet   amLODipine (NORVASC) 10 MG tablet   Chronic midline low back pain without sciatica - Primary    Chronic. Meloxicam not providing relief. Patient is performing home exercises. Discussed the need for physical therapy before further imaging can be considered. -Discontinue Meloxicam as it is not  providing relief. -Consider re-initiating physical therapy. Pt currently does not have time for PT.      Asthma    Well-controlled, no use of Albuterol inhaler in the past couple of months. -Continue current management. -Ensure adequate refills  of Albuterol for as-needed use.        I have discontinued Verlena I. Tomb's meloxicam. I am also having her maintain her albuterol, levonorgestrel, carvedilol, valsartan-hydrochlorothiazide, and amLODipine.  Meds ordered this encounter  Medications   valsartan-hydrochlorothiazide (DIOVAN-HCT) 320-25 MG tablet    Sig: Take 1 tablet by mouth daily.    Dispense:  90 tablet    Refill:  1    Order Specific Question:   Supervising Provider    Answer:   Danise Edge A [[id number]]   amLODipine (NORVASC) 10 MG tablet    Sig: Take 1 tablet (10 mg total) by mouth daily.    Dispense:  90 tablet    Refill:  1    Order Specific Question:   Supervising Provider    Answer:   Danise Edge A [[id number]]

## 2023-02-08 NOTE — Assessment & Plan Note (Signed)
Chronic. Meloxicam not providing relief. Patient is performing home exercises. Discussed the need for physical therapy before further imaging can be considered. -Discontinue Meloxicam as it is not providing relief. -Consider re-initiating physical therapy. Pt currently does not have time for PT.

## 2023-02-08 NOTE — Assessment & Plan Note (Signed)
Well-controlled, no use of Albuterol inhaler in the past couple of months. -Continue current management. -Ensure adequate refills of Albuterol for as-needed use.

## 2023-02-08 NOTE — Assessment & Plan Note (Signed)
  Hypertension: Well-controlled on Valsartan/Hydrochlorothiazide and Amlodipine. Blood pressure at today's visit was 128/72. -Continue current antihypertensive regimen. -Check blood pressure at next visit. -had normal BMET in the end of March.

## 2023-02-16 ENCOUNTER — Other Ambulatory Visit: Payer: Self-pay | Admitting: Family

## 2023-03-03 ENCOUNTER — Encounter: Payer: Self-pay | Admitting: Family

## 2023-03-06 NOTE — Telephone Encounter (Signed)
Form completed and given to patient's mother at her appt

## 2023-04-28 ENCOUNTER — Other Ambulatory Visit: Payer: Self-pay | Admitting: Family

## 2023-07-30 ENCOUNTER — Other Ambulatory Visit: Payer: Self-pay | Admitting: Family

## 2023-08-11 ENCOUNTER — Ambulatory Visit: Payer: 59 | Admitting: Family

## 2023-08-18 ENCOUNTER — Ambulatory Visit: Payer: 59 | Admitting: Family

## 2023-08-18 VITALS — BP 150/85 | HR 82 | Temp 98.6°F | Ht 67.0 in | Wt 315.0 lb

## 2023-08-18 DIAGNOSIS — Z23 Encounter for immunization: Secondary | ICD-10-CM

## 2023-08-18 DIAGNOSIS — I10 Essential (primary) hypertension: Secondary | ICD-10-CM

## 2023-08-18 DIAGNOSIS — R635 Abnormal weight gain: Secondary | ICD-10-CM | POA: Diagnosis not present

## 2023-08-18 DIAGNOSIS — R739 Hyperglycemia, unspecified: Secondary | ICD-10-CM

## 2023-08-18 DIAGNOSIS — J45909 Unspecified asthma, uncomplicated: Secondary | ICD-10-CM | POA: Diagnosis not present

## 2023-08-18 LAB — COMPREHENSIVE METABOLIC PANEL
ALT: 20 U/L (ref 0–35)
AST: 17 U/L (ref 0–37)
Albumin: 4 g/dL (ref 3.5–5.2)
Alkaline Phosphatase: 67 U/L (ref 39–117)
BUN: 10 mg/dL (ref 6–23)
CO2: 28 meq/L (ref 19–32)
Calcium: 9 mg/dL (ref 8.4–10.5)
Chloride: 106 meq/L (ref 96–112)
Creatinine, Ser: 0.74 mg/dL (ref 0.40–1.20)
GFR: 100.51 mL/min (ref >60.00)
Glucose, Bld: 99 mg/dL (ref 70–99)
Potassium: 4.2 meq/L (ref 3.5–5.1)
Sodium: 141 meq/L (ref 135–145)
Total Bilirubin: 0.5 mg/dL (ref 0.2–1.2)
Total Protein: 6.5 g/dL (ref 6.0–8.3)

## 2023-08-18 LAB — TSH: TSH: 1.68 u[IU]/mL (ref 0.35–5.50)

## 2023-08-18 LAB — HEMOGLOBIN A1C: Hgb A1c MFr Bld: 5.6 % (ref 4.6–6.5)

## 2023-08-18 MED ORDER — AMLODIPINE BESYLATE 10 MG PO TABS: 10.0000 mg | ORAL_TABLET | Freq: Every day | ORAL | 1 refills | Status: DC

## 2023-08-18 MED ORDER — VALSARTAN-HYDROCHLOROTHIAZIDE 320-25 MG PO TABS: 1.0000 | ORAL_TABLET | Freq: Every day | ORAL | 1 refills | Status: DC

## 2023-08-18 MED ORDER — CLONIDINE HCL 0.1 MG PO TABS: 0.1000 mg | ORAL_TABLET | Freq: Two times a day (BID) | ORAL | 2 refills | Status: DC

## 2023-08-18 NOTE — Assessment & Plan Note (Signed)
Lab Results   Component Value Date    HGBA1C 5.4 10/21/2022

## 2023-08-18 NOTE — Assessment & Plan Note (Signed)
Reports that she has been using her albuterol about 3x a week with good relief.

## 2023-08-18 NOTE — Assessment & Plan Note (Signed)
  Increase in weight despite regular exercise. Discussed thyroid check today and referral to [facility].

## 2023-08-18 NOTE — Patient Instructions (Signed)
VISIT SUMMARY:  During today's visit, we discussed your elevated blood pressure, stable asthma, recent weight gain, and general health maintenance. We reviewed your current medications and made some adjustments to better manage your blood pressure. We also talked about your exercise routine and potential next steps for weight management. Additionally, we administered your influenza vaccine and planned for some routine lab tests.  YOUR PLAN:  -HYPERTENSION: Hypertension means high blood pressure. Despite being on the maximum doses of your current medications, your blood pressure remains high. Please change your carvedilol to one tablet twice daily. Please check your home blood pressure on 08/21/2023 and communicate the results to [place]. If still elevated then we will plan for you to add clonidine.   -ASTHMA: Asthma is a condition where your airways narrow and swell, making it difficult to breathe. Your asthma has been stable, and you are using your Albuterol inhaler about three times a week due to weather changes. Continue with your current management as it is providing good relief.  -WEIGHT GAIN: You have experienced a weight gain of 15 pounds despite regular exercise. We will check your thyroid function and refer  you to the [facility] for further weight management support.  -GENERAL HEALTH MAINTENANCE: We administered your influenza vaccine today. We will also check your diabetes test, kidney, and liver function tests. Continue monitoring for diabetes due to your family history.  INSTRUCTIONS:  Please check your home blood pressure on 08/21/2023 and communicate the results to [place]. Consider getting thyroid function tests done and follow up with the [facility] for weight management. Continue monitoring your blood sugar levels due to your family history of diabetes.

## 2023-08-18 NOTE — Assessment & Plan Note (Signed)
  Elevated blood pressure despite maximum doses of Valsartan-Hydrochlorothiazide, Carvedilol, and Amlodipine. She tells me that she has been taking the carvedilol 25mg  two tablets together in the AM rather than splitting it up.  I advised her to split into bid dosing beginning tomorrow.  Recheck bp in AM at home on Monday and send me her results via mychart. If still above goal, then we will add clonidine 0.1mg  bid.

## 2023-08-18 NOTE — Progress Notes (Signed)
Subjective:     Patient ID: Amy Sutton, female    DOB: 12-Aug-1981, 42 y.o.   MRN: [id number]  Chief Complaint  Patient presents with   Hypertension    Here for follow up    Hypertension    Discussed the use of AI scribe software for clinical note transcription with the patient, who gave verbal consent to proceed.  History of Present Illness   The patient, with a history of hypertension, asthma, and a family history of diabetes, presents for a follow-up visit. She has not been checking her blood pressure at home. She has had a weight gain of 15 pounds since last visit, despite working out three days a week. She has a desk job and has noticed an improvement in ankle swelling since starting valsartan.  Her asthma has been stable, with the use of albuterol approximately three times a week due to weather changes. She reports good relief with the albuterol.     BP Readings from Last 3 Encounters:  08/18/23 (!) 150/85  02/08/23 128/72  01/11/23 118/80    Wt Readings from Last 3 Encounters:  08/18/23 (!) 315 lb (142.9 kg)  02/08/23 300 lb (136.1 kg)  11/28/22 (!) 302 lb (137 kg)      Health Maintenance Due  Topic Date Due   INFLUENZA VACCINE  04/06/2023    Past Medical History:  Diagnosis Date   Hypertension     Past Surgical History:  Procedure Laterality Date   Nexplanon     Inserted 11-2017    Family History  Problem Relation Age of Onset   Arthritis Mother    Hypertension Mother    Diabetes Mother    Hyperlipidemia Mother    Hypertension Father    Diabetes Father    Hyperlipidemia Father    Cancer Maternal Uncle        prostate   Cancer Paternal Aunt        Lymphoma   Asthma Son    Obesity Son     Social History   Socioeconomic History   Marital status: Single    Spouse name: Not on file   Number of children: 1   Years of education: Not on file   Highest education level: Master's degree (e.g., MA, MS, MEng, MEd, MSW, MBA)  Occupational  History    Employer: [employer]  Tobacco Use   Smoking status: Never   Smokeless tobacco: Never  Vaping Use   Vaping status: Never Used  Substance and Sexual Activity   Alcohol use: Not Currently   Drug use: No   Sexual activity: Yes    Partners: Male    Birth control/protection: I.U.D.    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             Social Drivers of Corporate investment banker Strain: Low Risk  (08/17/2023)   Overall Financial Resource Strain (CARDIA)    Difficulty of Paying Living Expenses: Not very hard  Food Insecurity: No Food Insecurity (08/17/2023)   Hunger Vital Sign    Worried About Running Out of Food in the  Last Year: Never true    Ran Out of Food in the Last Year: Never true  Transportation Needs: No Transportation Needs (08/17/2023)   PRAPARE - Administrator, Civil Service (Medical): No    Lack of Transportation (Non-Medical): No  Physical Activity: Sufficiently Active (08/17/2023)   Exercise Vital Sign    Days of Exercise per Week: 3 days    Minutes of Exercise per Session: 50 min  Stress: Stress Concern Present (08/17/2023)   [employer] of Occupational Health - Occupational Stress Questionnaire    Feeling of Stress : To some extent  Social Connections: Moderately Isolated (08/17/2023)   Social Connection and Isolation Panel [NHANES]    Frequency of Communication with Friends and Family: More than three times a week    Frequency of Social Gatherings with Friends and Family: Twice a week    Attends Religious Services: More than 4 times per year    Active Member of [employer] or Organizations: No    Attends Museum/gallery exhibitions officer: Not on file    Marital Status: Divorced  Intimate Partner Violence: Not on file    Outpatient Medications Prior to Visit  Medication Sig Dispense Refill   albuterol (VENTOLIN HFA) 108 (90 Base) MCG/ACT inhaler 2 puffs every 6 hours as needed. 6.7 g 3   carvedilol (COREG) 25 MG tablet TAKE 1 TABLET (25 MG TOTAL) BY MOUTH TWICE A DAY WITH MEALS 180 tablet 1   levonorgestrel (MIRENA) 20 MCG/DAY IUD 1 each by Intrauterine route once.     meloxicam (MOBIC) 7.5 MG tablet TAKE 1 TABLET (7.5 MG TOTAL) BY MOUTH DAILY AS NEEDED. FOR PAIN 90 tablet 1   amLODipine (NORVASC) 10 MG tablet Take 1 tablet (10 mg total) by mouth daily. 90 tablet 1   valsartan-hydrochlorothiazide (DIOVAN-HCT) 320-25 MG tablet Take 1 tablet by mouth daily. 90 tablet 1   No facility-administered medications prior to visit.    No Known Allergies  ROS See HPI    Objective:    Physical Exam Constitutional:      General: She is not in acute distress.    Appearance: Normal appearance. She is well-developed.  HENT:     Head: Normocephalic and atraumatic.     Right Ear: External ear normal.     Left Ear: External ear normal.  Eyes:     General: No scleral icterus. Neck:     Thyroid: No thyromegaly.  Cardiovascular:     Rate and Rhythm: Normal rate and regular rhythm.     Heart sounds: Normal heart sounds. No murmur heard. Pulmonary:     Effort: Pulmonary effort is normal. No respiratory distress.     Breath sounds: Normal breath sounds. No wheezing.  Musculoskeletal:     Cervical back: Neck supple.  Skin:    General: Skin is warm and dry.  Neurological:     Mental Status: She is alert and oriented to person, place, and time.  Psychiatric:        Mood and Affect: Mood normal.        Behavior: Behavior normal.        Thought Content: Thought content normal.        Judgment: Judgment normal.     Comments: Became tearful when we discussed her recent weight gain      BP (!) 150/85    Pulse 82   Temp 98.6 F (37 C) (Oral)   Ht 5\' 7"  (1.702 m)   Wt (!) 315  lb (142.9 kg)   BMI 49.34 kg/m  Wt Readings from Last 3 Encounters:  08/18/23 (!) 315 lb (142.9 kg)  02/08/23 300 lb (136.1 kg)  11/28/22 (!) 302 lb (137 kg)       Assessment & Plan:   Problem List Items Addressed This Visit       Unprioritized   Obesity, morbid (HCC)    Increase in weight despite regular exercise. Discussed thyroid check today and referral [facility].      Hyperglycemia - Primary   Lab Results  Component Value Date   HGBA1C 5.4 10/21/2022         Relevant Orders   HgB A1c   Comp Met (CMET)   HTN (hypertension)    Elevated blood pressure despite maximum doses of Valsartan-Hydrochlorothiazide, Carvedilol, and Amlodipine. She tells me that she has been taking the carvedilol 25mg  two tablets together in the AM rather than splitting it up.  I advised her to split into bid dosing beginning tomorrow.  Recheck bp in AM at home on Monday and send me her results via mychart. If still above goal, then we will add clonidine 0.1mg  bid.       Relevant Medications   valsartan-hydrochlorothiazide (DIOVAN-HCT) 320-25 MG tablet   amLODipine (NORVASC) 10 MG tablet   Asthma   Reports that she has been using her albuterol about 3x a week with good relief.       Other Visit Diagnoses       Weight gain       Relevant Orders   TSH     Morbid obesity (HCC)       Relevant Orders   Amb Ref to Medical Weight Management       I have discontinued Daviana I. Viveiros's cloNIDine. I am also having her maintain her albuterol, levonorgestrel, meloxicam, carvedilol, valsartan-hydrochlorothiazide, and amLODipine.  Meds ordered this encounter  Medications   DISCONTD: cloNIDine (CATAPRES) 0.1 MG tablet    Sig: Take 1 tablet (0.1 mg total) by mouth 2 (two) times daily.    Dispense:  60 tablet    Refill:  2    Supervising Provider:   Danise Edge A [[id number]]    valsartan-hydrochlorothiazide (DIOVAN-HCT) 320-25 MG tablet    Sig: Take 1 tablet by mouth daily.    Dispense:  90 tablet    Refill:  1    Supervising Provider:   Danise Edge A [[id number]]   amLODipine (NORVASC) 10 MG tablet    Sig: Take 1 tablet (10 mg total) by mouth daily.    Dispense:  90 tablet    Refill:  1    Supervising Provider:   Danise Edge A [[id number]]

## 2023-09-25 ENCOUNTER — Ambulatory Visit: Payer: 59 | Admitting: Family

## 2023-09-25 VITALS — BP 130/72 | HR 85 | Temp 98.3°F | Resp 16 | Ht 67.0 in | Wt 307.0 lb

## 2023-09-25 DIAGNOSIS — I10 Essential (primary) hypertension: Secondary | ICD-10-CM

## 2023-09-25 NOTE — Progress Notes (Signed)
Subjective:     Patient ID: Amy Sutton, female    DOB: [birth year], 43 y.o.   MRN: [id number]  Chief Complaint  Patient presents with   Hypertension    Here for follow up    Hypertension    Discussed the use of AI scribe software for clinical note transcription with the patient, who gave verbal consent to proceed.  History of Present Illness   The patient, with a history of hypertension presents for a routine follow-up on her blood pressure. Last visit she was dosing carvedilol 25mg  two tabs in the AM.  We advised her to split dosing into 25mg  bid. She reports compliance with her medication regimen. She has not reported any new or worsening symptoms. Her blood pressure at this visit is improved at 130/72. She denies needing any medication refills at this time.          Health Maintenance Due  Topic Date Due   Pneumococcal Vaccine 24-3 Years old (1 of 2 - PCV) Never done    Past Medical History:  Diagnosis Date   Hypertension     Past Surgical History:  Procedure Laterality Date   Nexplanon     Inserted 11-2017    Family History  Problem Relation Age of Onset   Arthritis Mother    Hypertension Mother    Diabetes Mother    Hyperlipidemia Mother    Hypertension Father    Diabetes Father    Hyperlipidemia Father    Cancer Maternal Uncle        prostate   Cancer Paternal Aunt        Lymphoma   Asthma Son    Obesity Son     Social History   Socioeconomic History   Marital status: Single    Spouse name: Not on file   Number of children: 1   Years of education: Not on file   Highest education level: Master's degree (e.g., MA, MS, MEng, MEd, MSW, MBA)  Occupational History    Employer: [employer]  Tobacco Use   Smoking status: Never   Smokeless tobacco: Never  Vaping Use   Vaping status: Never Used  Substance and Sexual Activity   Alcohol use: Not Currently   Drug use: No   Sexual activity: Yes    Partners: Male    Birth  control/protection: I.U.D.    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             Social Drivers of Corporate investment banker Strain: Low Risk  (08/17/2023)   Overall Financial Resource Strain (CARDIA)    Difficulty of Paying Living Expenses: Not very hard  Food Insecurity: No Food Insecurity (08/17/2023)   Hunger Vital Sign    Worried About Running Out of Food in the Last Year: Never true    Ran Out of Food in the Last Year: Never true  Transportation Needs: No Transportation Needs (08/17/2023)   PRAPARE - Administrator, Civil Service (Medical): No    Lack of Transportation (Non-Medical): No  Physical Activity: Sufficiently Active (08/17/2023)   Exercise Vital  Sign    Days of Exercise per Week: 3 days    Minutes of Exercise per Session: 50 min  Stress: Stress Concern Present (08/17/2023)   [employer] of Occupational Health - Occupational Stress Questionnaire    Feeling of Stress : To some extent  Social Connections: Moderately Isolated (08/17/2023)   Social Connection and Isolation Panel [NHANES]    Frequency of Communication with Friends and Family: More than three times a week    Frequency of Social Gatherings with Friends and Family: Twice a week    Attends Religious Services: More than 4 times per year    Active Member of [employer] or Organizations: No    Attends Engineer, structural: Not on file    Marital Status: Divorced  Intimate Partner Violence: Not on file    Outpatient Medications Prior to Visit  Medication Sig Dispense Refill   albuterol (VENTOLIN HFA) 108 (90 Base) MCG/ACT inhaler 2 puffs every 6 hours as needed. 6.7 g 3    amLODipine (NORVASC) 10 MG tablet Take 1 tablet (10 mg total) by mouth daily. 90 tablet 1   carvedilol (COREG) 25 MG tablet TAKE 1 TABLET (25 MG TOTAL) BY MOUTH TWICE A DAY WITH MEALS 180 tablet 1   levonorgestrel (MIRENA) 20 MCG/DAY IUD 1 each by Intrauterine route once.     meloxicam (MOBIC) 7.5 MG tablet TAKE 1 TABLET (7.5 MG TOTAL) BY MOUTH DAILY AS NEEDED. FOR PAIN 90 tablet 1   valsartan-hydrochlorothiazide (DIOVAN-HCT) 320-25 MG tablet Take 1 tablet by mouth daily. 90 tablet 1   No facility-administered medications prior to visit.    No Known Allergies  ROS     Objective:    Physical Exam Constitutional:      General: She is not in acute distress.    Appearance: Normal appearance. She is well-developed.  HENT:     Head: Normocephalic and atraumatic.     Right Ear: External ear normal.     Left Ear: External ear normal.  Eyes:     General: No scleral icterus. Neck:     Thyroid: No thyromegaly.  Cardiovascular:     Rate and Rhythm: Normal rate and regular rhythm.     Heart sounds: Normal heart sounds. No murmur heard. Pulmonary:     Effort: Pulmonary effort is normal. No respiratory distress.     Breath sounds: Normal breath sounds. No wheezing.  Musculoskeletal:     Cervical back: Neck supple.  Skin:    General: Skin is warm and dry.  Neurological:     Mental Status: She is alert and oriented to person, place, and time.  Psychiatric:        Mood and Affect: Mood normal.        Behavior: Behavior normal.        Thought Content: Thought content normal.        Judgment: Judgment normal.      BP 130/72 (BP Location: Right Arm, Patient Position: Sitting, Cuff Size: Large)   Pulse 85   Temp 98.3 F (36.8 C) (Oral)   Resp 16   Ht 5\' 7"  (1.702 m)   Wt (!) 307 lb (139.3 kg)   SpO2 100%   BMI 48.08 kg/m  Wt Readings from Last 3 Encounters:  09/25/23 (!) 307 lb (139.3 kg)  08/18/23 (!) 315 lb (142.9 kg)  02/08/23 300 lb (136.1 kg)       Assessment & Plan:    Problem List Items Addressed This Visit  Unprioritized   HTN (hypertension) - Primary   BP Readings from Last 3 Encounters:  09/25/23 130/72  08/18/23 (!) 150/85  02/08/23 128/72   BP is much improved. Continue current regimen of dionvan-HCT, carvedilol, amlodipine.        I am having Amy Sutton maintain her albuterol, levonorgestrel, meloxicam, carvedilol, valsartan-hydrochlorothiazide, and amLODipine.  No orders of the defined types were placed in this encounter.

## 2023-09-25 NOTE — Assessment & Plan Note (Signed)
BP Readings from Last 3 Encounters:  09/25/23 130/72  08/18/23 (!) 150/85  02/08/23 128/72   BP is much improved. Continue current regimen of dionvan-HCT, carvedilol, amlodipine.

## 2023-09-25 NOTE — Patient Instructions (Signed)
VISIT SUMMARY:  You came in today for a routine follow-up visit. Your blood pressure has improved, and you are doing well with your current medications. All your recent lab tests came back normal.  YOUR PLAN:  -HYPERTENSION: Hypertension, or high blood pressure, means that the force of the blood against your artery walls is too high. Your blood pressure is well-controlled with Carvedilol 25mg  taken twice daily. Please continue this medication as prescribed. We will recheck your blood pressure in 4 months.  -GENERAL HEALTH MAINTENANCE: All your recent lab tests, including those for diabetes, thyroid function, kidney function, electrolytes, and liver function, were normal. Continue with your current management plan and follow up in 4 months.  INSTRUCTIONS:  Please recheck your blood pressure in 4 months and follow up with [place] at that time.

## 2023-10-17 ENCOUNTER — Telehealth (HOSPITAL_BASED_OUTPATIENT_CLINIC_OR_DEPARTMENT_OTHER): Payer: Self-pay

## 2023-10-21 ENCOUNTER — Other Ambulatory Visit: Payer: Self-pay | Admitting: Family

## 2023-11-07 ENCOUNTER — Ambulatory Visit (INDEPENDENT_AMBULATORY_CARE_PROVIDER_SITE_OTHER): Payer: 59 | Admitting: Physician Assistant

## 2023-11-07 ENCOUNTER — Encounter (INDEPENDENT_AMBULATORY_CARE_PROVIDER_SITE_OTHER): Payer: Self-pay | Admitting: Physician Assistant

## 2023-11-07 VITALS — BP 144/82 | HR 88 | Temp 97.6°F | Ht 67.5 in | Wt 311.0 lb

## 2023-11-07 DIAGNOSIS — E66813 Obesity, class 3: Secondary | ICD-10-CM

## 2023-11-07 DIAGNOSIS — J45909 Unspecified asthma, uncomplicated: Secondary | ICD-10-CM

## 2023-11-07 DIAGNOSIS — M545 Low back pain, unspecified: Secondary | ICD-10-CM

## 2023-11-07 DIAGNOSIS — Z0289 Encounter for other administrative examinations: Secondary | ICD-10-CM

## 2023-11-07 DIAGNOSIS — I10 Essential (primary) hypertension: Secondary | ICD-10-CM | POA: Diagnosis not present

## 2023-11-07 DIAGNOSIS — R739 Hyperglycemia, unspecified: Secondary | ICD-10-CM | POA: Diagnosis not present

## 2023-11-07 DIAGNOSIS — L732 Hidradenitis suppurativa: Secondary | ICD-10-CM | POA: Diagnosis not present

## 2023-11-07 DIAGNOSIS — Z6841 Body Mass Index (BMI) 40.0 and over, adult: Secondary | ICD-10-CM

## 2023-11-07 DIAGNOSIS — G8929 Other chronic pain: Secondary | ICD-10-CM

## 2023-11-07 NOTE — Progress Notes (Signed)
 Office: [phone]  /  Fax: [phone]   Initial Visit  Amy Sutton was seen in clinic today to evaluate for obesity. She is interested in losing weight to improve overall health and reduce the risk of weight related complications. She presents today to review program treatment options, initial physical assessment, and evaluation.     She was referred by: PCP  When asked what else they would like to accomplish? She states: Adopt healthier eating patterns, Improve energy levels and physical activity, Improve existing medical conditions, Reduce number of medications, Reduce risk for a surgery, Improve quality of life, Improve appearance, Improve self-confidence, and wants to work on being healthier first.   Weight history: Struggled with weight even in youth. Has not tried any programs .  Noted increased weight gain as approached 40's.   Works as Sr. Office specialist at [employer]  When asked how has your weight affected you? She states: Has affected self-esteem, Relationships, Contributed to medical problems, Contributed to orthopedic problems or mobility issues, Having fatigue, Having poor endurance, Problems with eating patterns, and Has affected mood   Some associated conditions: Hypertension, Arthritis:Knees and back, Prediabetes, GERD, Lung disease, and Venous insufficiency  Contributing factors: Family history of obesity, Consumption of processed foods, Reduced physical activity, Eating patterns, Strong orexigenic signaling and inadequate inhibitory control , Slow metabolism for age, and Enticing relationships and enviroment  Weight promoting medications identified: None  Current nutrition plan: Portion control / smart choices  Current level of physical activity: None and Other: Zumba and strength training 2-3 days per week  Current or previous pharmacotherapy: None  Response to medication: Never tried medications   Past medical history  includes:   Past Medical History:  Diagnosis Date   Hypertension      Objective:   BP (!) 144/82   Pulse 88   Temp 97.6 F (36.4 C)   Ht 5' 7.5" (1.715 m)   Wt (!) 311 lb (141.1 kg)   SpO2 100%   BMI 47.99 kg/m  She was weighed on the bioimpedance scale: Body mass index is 47.99 kg/m.  Peak Weight:315 lbs , Body Fat%:53.4%, Visceral Fat Rating:18, Weight trend over the last 12 months: Increasing  General:  Alert, oriented and cooperative. Patient is in no acute distress.  Respiratory: Normal respiratory effort, no problems with respiration noted   Gait: able to ambulate independently  Mental Status: Normal mood and affect. Normal behavior. Normal judgment and thought content.   DIAGNOSTIC DATA REVIEWED:  BMET    Component Value Date/Time   NA 141 08/18/2023 0828   K 4.2 08/18/2023 0828   CL 106 08/18/2023 0828   CO2 28 08/18/2023 0828   GLUCOSE 99 08/18/2023 0828   BUN 10 08/18/2023 0828   CREATININE 0.74 08/18/2023 0828   CREATININE 0.70 04/02/2014 1052   CALCIUM 9.0 08/18/2023 0828   Lab Results  Component Value Date   HGBA1C 5.6 08/18/2023   HGBA1C 5.4 11/11/2014   No results found for: "INSULIN" CBC    Component Value Date/Time   WBC 6.1 10/21/2022 0912   RBC 4.71 10/21/2022 0912   HGB 12.8 10/21/2022 0912   HCT 39.6 10/21/2022 0912   PLT 282.0 10/21/2022 0912   MCV 84.1 10/21/2022 0912   MCH 27.3 05/05/2020 0925   MCHC 32.3 10/21/2022 0912   RDW 13.8 10/21/2022 0912   Iron/TIBC/Ferritin/ %Sat No results found for: "IRON", "TIBC", "FERRITIN", "IRONPCTSAT" Lipid Panel     Component Value Date/Time  CHOL 158 10/21/2022 0912   TRIG 62.0 10/21/2022 0912   HDL 39.80 10/21/2022 0912   CHOLHDL 4 10/21/2022 0912   VLDL 12.4 10/21/2022 0912   LDLCALC 106 (H) 10/21/2022 0912   LDLCALC 105 (H) 05/05/2020 0925   Hepatic Function Panel     Component Value Date/Time   PROT 6.5 08/18/2023 0828   ALBUMIN 4.0 08/18/2023 0828   AST 17 08/18/2023 0828    ALT 20 08/18/2023 0828   ALKPHOS 67 08/18/2023 0828   BILITOT 0.5 08/18/2023 0828   BILIDIR 0.1 05/05/2020 0925   IBILI 0.3 05/05/2020 0925      Component Value Date/Time   TSH 1.68 08/18/2023 0828     Assessment and Plan:   Primary hypertension  Hidradenitis  Uncomplicated asthma, unspecified asthma severity, unspecified whether persistent  Hyperglycemia  Chronic midline low back pain without sciatica  Class 3 severe obesity due to excess calories with serious comorbidity and body mass index (BMI) of 45.0 to 49.9 in adult [facility])        Obesity Treatment / Action Plan:  Patient will work on garnering support from family and friends to begin weight loss journey. Will work on eliminating or reducing the presence of highly palatable, calorie dense foods in the home. Will complete provided nutritional and psychosocial assessment questionnaire before the next appointment. Will be scheduled for indirect calorimetry to determine resting energy expenditure in a fasting state.  This will allow [place] to create a reduced calorie, high-protein meal plan to promote loss of fat mass while preserving muscle mass. Will avoid skipping meals which may result in increased hunger signals and overeating at certain times. Counseled on the health benefits of losing 5%-15% of total body weight. Will work on improving sleep hygiene and trying to obtain at least 7 hours of sleep. Was counseled on nutritional approaches to weight loss and benefits of reducing processed foods and consuming plant-based foods and high quality protein as part of nutritional weight management. Was counseled on pharmacotherapy and role as an adjunct in weight management.   Obesity Education Performed Today:  She was weighed on the bioimpedance scale and results were discussed and documented in the synopsis.  We discussed obesity as a disease and the importance of a more detailed evaluation of all the factors contributing  to the disease.  We discussed the importance of long term lifestyle changes which include nutrition, exercise and behavioral modifications as well as the importance of customizing this to her specific health and social needs.  We discussed the benefits of reaching a healthier weight to alleviate the symptoms of existing conditions and reduce the risks of the biomechanical, metabolic and psychological effects of obesity.  Amy Sutton appears to be in the action stage of change and states they are ready to start intensive lifestyle modifications and behavioral modifications.  30 minutes was spent today on this visit including the above counseling, pre-visit chart review, and post-visit documentation.  Reviewed by clinician on day of visit: allergies, medications, problem list, medical history, surgical history, family history, social history, and previous encounter notes pertinent to obesity diagnosis.   Sullivan Blasing,PA-C

## 2023-11-07 NOTE — Patient Instructions (Signed)
315

## 2023-11-27 ENCOUNTER — Ambulatory Visit (INDEPENDENT_AMBULATORY_CARE_PROVIDER_SITE_OTHER): Admitting: Internal Medicine

## 2023-11-29 ENCOUNTER — Ambulatory Visit (INDEPENDENT_AMBULATORY_CARE_PROVIDER_SITE_OTHER): Payer: 59 | Admitting: Obstetrics and Gynecology

## 2023-11-29 ENCOUNTER — Encounter: Payer: Self-pay | Admitting: Obstetrics and Gynecology

## 2023-11-29 VITALS — BP 118/80 | HR 83 | Ht 67.75 in | Wt 316.0 lb

## 2023-11-29 DIAGNOSIS — E282 Polycystic ovarian syndrome: Secondary | ICD-10-CM

## 2023-11-29 DIAGNOSIS — R102 Pelvic and perineal pain: Secondary | ICD-10-CM

## 2023-11-29 DIAGNOSIS — Z975 Presence of (intrauterine) contraceptive device: Secondary | ICD-10-CM | POA: Diagnosis not present

## 2023-11-29 DIAGNOSIS — Z01419 Encounter for gynecological examination (general) (routine) without abnormal findings: Secondary | ICD-10-CM | POA: Diagnosis not present

## 2023-11-29 MED ORDER — TIRZEPATIDE-WEIGHT MANAGEMENT 2.5 MG/0.5ML ~~LOC~~ SOLN: 2.5000 mg | SUBCUTANEOUS | 0 refills | Status: DC

## 2023-11-29 NOTE — Progress Notes (Signed)
 43 y.o. y.o. female here for annual exam. No LMP recorded. (Menstrual status: IUD).     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  Son wants to go into same field. Her  parents have moved here to help her and she takes care of them.     HPI: 12/06/22 changed to mirena IUD d/t HBP.  Doing well on it but having cramping each month.  Periods were always irregular in the past. Obesity is another factor which could be helped by stopping the Depo Provera.    No pelvic pain.  Pap Neg/HPV Neg 11/2021.   11/28/22 MMG birads 1 neg. Colonoscopy not done. Begin at 21 with no risks.Marland Kitchen BMI 45.58 last year. Planning to improve diet and exercise more. Health labs with Fam MD and at wellness clinic. Sent in zep bound to [facility]. Body mass index is 48.4 kg/m.     10/21/2022    8:41 AM 07/18/2022    7:27 AM 02/21/2022    9:55 AM  Depression screen PHQ 2/9  Decreased Interest 0 0 0  Down, Depressed, Hopeless 0 0 0  PHQ - 2 Score 0 0 0  Altered sleeping 0 0   Tired, decreased energy 0 0   Change in appetite 0 0   Feeling bad or failure about yourself  0 0   Trouble concentrating 0 0   Moving slowly or fidgety/restless 0 0   Suicidal thoughts 0 0   PHQ-9 Score 0 0   Difficult doing work/chores Not difficult at all Not difficult at all     Blood pressure 118/80, pulse 83, height 5' 7.75" (1.721 m), weight (!) 316 lb (143.3 kg), SpO2 99%.     Component Value Date/Time   DIAGPAP  11/28/2022 1435    - Negative for intraepithelial lesion or malignancy (NILM)   DIAGPAP  11/05/2021 1400    - Negative for intraepithelial lesion or malignancy (NILM)   DIAGPAP  07/17/2017 0000    NEGATIVE FOR INTRAEPITHELIAL LESIONS OR MALIGNANCY.   HPVHIGH Negative 11/05/2021 1400   ADEQPAP  11/28/2022 1435    Satisfactory for evaluation; transformation zone component PRESENT.   ADEQPAP  11/05/2021 1400    Satisfactory for evaluation; transformation zone component  PRESENT.   ADEQPAP  07/17/2017 0000    Satisfactory for evaluation  endocervical/transformation zone component PRESENT.    GYN HISTORY:    Component Value Date/Time   DIAGPAP  11/28/2022 1435    - Negative for intraepithelial lesion or malignancy (NILM)   DIAGPAP  11/05/2021 1400    - Negative for intraepithelial lesion or malignancy (NILM)   DIAGPAP  07/17/2017 0000    NEGATIVE FOR INTRAEPITHELIAL LESIONS OR MALIGNANCY.   HPVHIGH Negative 11/05/2021 1400   ADEQPAP  11/28/2022 1435    Satisfactory for evaluation; transformation zone component PRESENT.   ADEQPAP  11/05/2021 1400    Satisfactory for evaluation; transformation zone component PRESENT.   ADEQPAP  07/17/2017 0000    Satisfactory for evaluation  endocervical/transformation zone component PRESENT.    OB History  Gravida Para Term Preterm AB Living  1 1 1   1   SAB IAB Ectopic Multiple Live Births          # Outcome Date GA Lbr Len/2nd Weight Sex Type Anes PTL Lv  1 Term             Past Medical History:  Diagnosis Date   Hypertension  Past Surgical History:  Procedure Laterality Date   Nexplanon     Inserted 11-2017    Current Outpatient Medications on File Prior to Visit  Medication Sig Dispense Refill   albuterol (VENTOLIN HFA) 108 (90 Base) MCG/ACT inhaler 2 puffs every 6 hours as needed. 6.7 g 3   amLODipine (NORVASC) 10 MG tablet Take 1 tablet (10 mg total) by mouth daily. 90 tablet 1   carvedilol (COREG) 25 MG tablet TAKE 1 TABLET (25 MG TOTAL) BY MOUTH TWICE A DAY WITH MEALS 180 tablet 1   levonorgestrel (MIRENA) 20 MCG/DAY IUD 1 each by Intrauterine route once.     meloxicam (MOBIC) 7.5 MG tablet TAKE 1 TABLET (7.5 MG TOTAL) BY MOUTH DAILY AS NEEDED. FOR PAIN 30 tablet 5   valsartan-hydrochlorothiazide (DIOVAN-HCT) 320-25 MG tablet Take 1 tablet by mouth daily. 90 tablet 1   No current facility-administered medications on file prior to visit.    Social History   Socioeconomic History    Marital status: Single    Spouse name: Not on file   Number of children: 1   Years of education: Not on file   Highest education level: Master's degree (e.g., MA, MS, MEng, MEd, MSW, MBA)  Occupational History    Employer: [employer]  Tobacco Use   Smoking status: Never   Smokeless tobacco: Never  Vaping Use   Vaping status: Never Used  Substance and Sexual Activity   Alcohol use: Not Currently   Drug use: No   Sexual activity: Yes    Partners: Male    Birth control/protection: I.U.D.    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             Social Drivers of Corporate investment banker Strain: Low Risk  (08/17/2023)   Overall Financial Resource Strain (CARDIA)    Difficulty of Paying Living Expenses: Not very hard  Food Insecurity: No Food Insecurity (08/17/2023)   Hunger Vital Sign    Worried About Running Out of Food in the Last Year: Never true    Ran Out of Food in the Last Year: Never true  Transportation Needs: No Transportation Needs (08/17/2023)   PRAPARE - Administrator, Civil Service (Medical): No    Lack of Transportation (Non-Medical): No  Physical Activity: Sufficiently Active (08/17/2023)   Exercise Vital Sign    Days of Exercise per Week: 3 days    Minutes of Exercise per Session: 50 min  Stress: Stress Concern Present (08/17/2023)   [employer] of Occupational Health - Occupational Stress Questionnaire    Feeling of Stress : To some extent  Social Connections: Moderately Isolated (08/17/2023)   Social Connection and Isolation Panel [NHANES]    Frequency of Communication with Friends and Family: More than three times a  week    Frequency of Social Gatherings with Friends and Family: Twice a week    Attends Religious Services: More than 4 times per year    Active Member of [employer] or Organizations: No    Attends Engineer, structural: Not on file    Marital Status: Divorced  Intimate Partner Violence: Not on file    Family History  Problem Relation Age of Onset   Arthritis Mother    Hypertension Mother    Diabetes Mother    Hyperlipidemia Mother    Hypertension Father    Diabetes Father    Hyperlipidemia Father    Cancer Maternal Uncle        prostate   Cancer Paternal Aunt        Lymphoma   Asthma Son    Obesity Son      No Known Allergies    Patient's last menstrual period was No LMP recorded. (Menstrual status: IUD)..             Review of Systems Alls systems reviewed and are negative.     Physical Exam Constitutional:      Appearance: Normal appearance.  Genitourinary:     Vulva and urethral meatus normal.     No lesions in the vagina.     Genitourinary Comments: Difficult to fully evaluate uterus and ovaries due to body habitus.      Right Labia: No rash, lesions or skin changes.    Left Labia: No lesions, skin changes or rash.    No vaginal discharge or tenderness.     No vaginal prolapse present.    No vaginal atrophy present.     Right Adnexa: not tender, not palpable and no mass present.    Left Adnexa: not tender, not palpable and no mass present.    No cervical motion tenderness or discharge.     IUD strings visualized.     Uterus is not enlarged, tender or irregular.  Breasts:    Right: Normal.     Left: Normal.  HENT:     Head: Normocephalic.  Neck:     Thyroid: No thyroid mass, thyromegaly or thyroid tenderness.  Cardiovascular:     Rate and Rhythm: Normal rate and regular rhythm.     Heart sounds: Normal heart sounds, S1 normal and S2 normal.  Pulmonary:     Effort: Pulmonary effort is normal.     Breath sounds: Normal breath sounds and air  entry.  Abdominal:     General: There is no distension.     Palpations: Abdomen is soft. There is no mass.     Tenderness: There is no abdominal tenderness. There is no guarding or rebound.  Musculoskeletal:        General: Normal range of motion.     Cervical back: Full passive range of motion without pain, normal range of motion and neck supple. No tenderness.     Right lower leg: No edema.     Left lower leg: No edema.  Neurological:     Mental Status: She is alert.  Skin:    General: Skin is warm.  Psychiatric:        Mood and Affect: Mood normal.        Behavior: Behavior normal.        Thought Content: Thought content normal.  Vitals and nursing note reviewed. Exam conducted with a chaperone present.       A:         Well Woman GYN exam, mirena IUD in place(2024)                             P:        Pap smear not indicated Encouraged annual mammogram screening Colon cancer  screening not indicated DXA not indicated Labs and immunizations to do with PMD RTC for PUS. Discussed risk with likely PCO with diabetes and endometrial cancer. To continue with IUD for endometrial protection and evaluate lining with ultrasound periodically. RTC with any heavy bleeding or severe pain. Recent hga1c normal Encouraged healthy lifestyle practices   No follow-ups on file.  Earley Favor

## 2023-12-04 ENCOUNTER — Ambulatory Visit (INDEPENDENT_AMBULATORY_CARE_PROVIDER_SITE_OTHER): Admitting: Family Medicine

## 2023-12-18 ENCOUNTER — Ambulatory Visit (INDEPENDENT_AMBULATORY_CARE_PROVIDER_SITE_OTHER): Admitting: Family Medicine

## 2023-12-29 ENCOUNTER — Other Ambulatory Visit (HOSPITAL_BASED_OUTPATIENT_CLINIC_OR_DEPARTMENT_OTHER): Payer: Self-pay | Admitting: Family

## 2023-12-29 DIAGNOSIS — Z1231 Encounter for screening mammogram for malignant neoplasm of breast: Secondary | ICD-10-CM

## 2024-01-04 ENCOUNTER — Other Ambulatory Visit: Admitting: Obstetrics and Gynecology

## 2024-01-04 ENCOUNTER — Other Ambulatory Visit

## 2024-01-15 ENCOUNTER — Ambulatory Visit (HOSPITAL_BASED_OUTPATIENT_CLINIC_OR_DEPARTMENT_OTHER)
Admission: RE | Admit: 2024-01-15 | Discharge: 2024-01-15 | Disposition: A | Discharge status: Home / Self Care | Source: Ambulatory Visit | Attending: Family | Admitting: Family

## 2024-01-15 ENCOUNTER — Encounter (HOSPITAL_BASED_OUTPATIENT_CLINIC_OR_DEPARTMENT_OTHER): Payer: Self-pay

## 2024-01-15 ENCOUNTER — Ambulatory Visit (INDEPENDENT_AMBULATORY_CARE_PROVIDER_SITE_OTHER): Admitting: Family Medicine

## 2024-01-15 ENCOUNTER — Encounter (INDEPENDENT_AMBULATORY_CARE_PROVIDER_SITE_OTHER): Payer: Self-pay | Admitting: Family Medicine

## 2024-01-15 VITALS — BP 145/84 | HR 90 | Temp 98.1°F | Ht 68.0 in | Wt 311.0 lb

## 2024-01-15 DIAGNOSIS — Z1231 Encounter for screening mammogram for malignant neoplasm of breast: Secondary | ICD-10-CM | POA: Insufficient documentation

## 2024-01-15 DIAGNOSIS — E7800 Pure hypercholesterolemia, unspecified: Secondary | ICD-10-CM | POA: Diagnosis not present

## 2024-01-15 DIAGNOSIS — R0602 Shortness of breath: Secondary | ICD-10-CM | POA: Diagnosis not present

## 2024-01-15 DIAGNOSIS — R7303 Prediabetes: Secondary | ICD-10-CM | POA: Diagnosis not present

## 2024-01-15 DIAGNOSIS — Z8759 Personal history of other complications of pregnancy, childbirth and the puerperium: Secondary | ICD-10-CM

## 2024-01-15 DIAGNOSIS — Z1331 Encounter for screening for depression: Secondary | ICD-10-CM | POA: Diagnosis not present

## 2024-01-15 DIAGNOSIS — L732 Hidradenitis suppurativa: Secondary | ICD-10-CM

## 2024-01-15 DIAGNOSIS — R5383 Other fatigue: Secondary | ICD-10-CM

## 2024-01-15 DIAGNOSIS — E785 Hyperlipidemia, unspecified: Secondary | ICD-10-CM | POA: Insufficient documentation

## 2024-01-15 DIAGNOSIS — Z6841 Body Mass Index (BMI) 40.0 and over, adult: Secondary | ICD-10-CM

## 2024-01-15 DIAGNOSIS — I10 Essential (primary) hypertension: Secondary | ICD-10-CM | POA: Diagnosis not present

## 2024-01-15 NOTE — Assessment & Plan Note (Signed)
 Both parents are diabetic.  A1c 5 years ago 5.7.  Recent A1c within normal limits.  Will repeat A1c and Insulin level today.  Plan to discuss labs at next appointment.

## 2024-01-15 NOTE — Assessment & Plan Note (Signed)
 Term pre- eclampsia in 2006.  Currently has IUD for birth control.  Resistant hypertension.

## 2024-01-15 NOTE — Assessment & Plan Note (Signed)
 Well controlled.  Patient reports she has not had a flair in a long time.  Will follow up at susbsequent appointments distally.

## 2024-01-15 NOTE — Progress Notes (Signed)
 Chief Complaint:  Obesity   Subjective:  Amy Sutton (MR# [id number]) is a 43 y.o. female who presents for evaluation and treatment of obesity and related comorbidities.   Amy Sutton is currently in the action stage of change and ready to dedicate time achieving and maintaining a healthier weight. Amy Sutton is interested in becoming our patient and working on intensive lifestyle modifications including (but not limited to) diet and exercise for weight loss.  Amy Sutton has been struggling with her weight. She has been unsuccessful in either losing weight, maintaining weight loss, or reaching her healthy weight goal.  Patient works as an Environmental health practitioner and makes forms, flyers, background checks.  Desk job, M-F.  Lives at home with son Amy Sutton, mother and father.  Everyone supportive of her, eats meals with her and will be changing how they eat with her. Desired weight is 250- can't recall last time she was that weight.  Previously tried keto- lost 30lbs but put it back on.  Eats outside home fast food or takeout 3 times a week.  Patient reports doing most of the grocery shopping with her mother.  She states they go once a week but do not use a grocery list and she does not enjoy cooking.  Biggest obstacles to cooking is getting home she like to cook.  Foods she tends to crave are sweets and she tends to use that and snacking with various types of candy.  She skips meals often mostly breakfast at least 3 times a week.  Mentions that eating healthier has been financially difficult for her in the past.  She does snack frequently between meals and after dinner.  Her food dislikes her liver, milk, and cottage cheese.  She reports her worst food habits is eating sweets and states that she struggles with poor food choices and portion control but does not have excessive hunger.  Food Recall: [employer] yogurt in the am with blueberries, almonds, pecans and water.  Feels satisfied.  May have a piece of candy like  one small fun size.  Lunch is salad, grilled chicken (eats all); feels full.  Has a small piece of candy after lunch.  Eats again around 8pm- tuna sub from [employer] (8 inch- eats most of it with chips; feels full) or baked chicken, collard greens (6-8 wingettes; full) or hamburgers, onion, gravy, mashed potatoes- 1 cup (2 patties; feels full).  Occasionally wakes up to have a snack in the middle of the night and will have chips. Biggest issues are poor food choices and portion control.  Indirect Calorimeter completed today shows a RMR: 2304.  Her calculated basal metabolic rate is 1478 thus her basal metabolic rate is better than expected.  Other Fatigue Amy Sutton admits to daytime somnolence and admits to waking up still tired. Patient has a history of symptoms of hypertension. Amy Sutton generally gets 8 hours of sleep per night, and states that she has generally restless sleep. Snoring is unknown. Apneic episodes are not present. Epworth Sleepiness Score is 7.   Shortness of Breath Amy Sutton notes increasing shortness of breath with exercising and seems to be worsening over time with weight gain. She notes getting out of breath sooner with activity than she used to. This has not gotten worse recently. Amy Sutton denies shortness of breath at rest or orthopnea.  Depression Screen Amy Sutton's Food and Mood (modified PHQ-9) score was 13.     01/15/2024    7:38 AM  Depression screen PHQ 2/9  Decreased Interest 1  Down, Depressed,  Hopeless 0  PHQ - 2 Score 1  Altered sleeping 0  Tired, decreased energy 1  Change in appetite 2  Feeling bad or failure about yourself  2  Moving slowly or fidgety/restless 0  Suicidal thoughts 0  PHQ-9 Score 6  Difficult doing work/chores Not difficult at all     Objective:  Vitals Temp: 98.1 F (36.7 C) BP: (!) 145/84 Pulse Rate: 90 SpO2: 100 %   Anthropometric Measurements Height: 5\' 8"  (1.727 m) Weight: (!) 311 lb (141.1 kg) BMI (Calculated):  47.3 Starting Weight: 311 lb Peak Weight: 315 lb Waist Measurement : 53 inches   Body Composition  Body Fat %: 51.5 % Fat Mass (lbs): 160.2 lbs Muscle Mass (lbs): 143.2 lbs Total Body Water (lbs): 109 lbs Visceral Fat Rating : 17   Other Clinical Data RMR: 2304 Fasting: yes Labs: yes Today's Visit #: 1 Starting Date: 01/15/24    EKG: Normal sinus rhythm, rate 83.  General: Cooperative, alert, well developed, in no acute distress. HEENT: Conjunctivae and lids unremarkable. Cardiovascular: Regular rhythm.  Lungs: Normal work of breathing. Neurologic: No focal deficits.   Lab Results  Component Value Date   CREATININE 0.74 08/18/2023   BUN 10 08/18/2023   NA 141 08/18/2023   K 4.2 08/18/2023   CL 106 08/18/2023   CO2 28 08/18/2023   Lab Results  Component Value Date   ALT 20 08/18/2023   AST 17 08/18/2023   ALKPHOS 67 08/18/2023   BILITOT 0.5 08/18/2023   Lab Results  Component Value Date   HGBA1C 5.6 08/18/2023   HGBA1C 5.4 10/21/2022   HGBA1C 5.4 02/21/2022   HGBA1C 5.7 06/11/2018   HGBA1C 5.5 10/16/2015   No results found for: "INSULIN" Lab Results  Component Value Date   TSH 1.68 08/18/2023   Lab Results  Component Value Date   CHOL 158 10/21/2022   HDL 39.80 10/21/2022   LDLCALC 106 (H) 10/21/2022   TRIG 62.0 10/21/2022   CHOLHDL 4 10/21/2022   Lab Results  Component Value Date   WBC 6.1 10/21/2022   HGB 12.8 10/21/2022   HCT 39.6 10/21/2022   MCV 84.1 10/21/2022   PLT 282.0 10/21/2022   No results found for: "IRON", "TIBC", "FERRITIN"  Assessment and Plan:   Other Fatigue  Amy Sutton does feel that her weight is causing her energy to be lower than it should be. Fatigue may be related to obesity, depression or many other causes. Labs will be ordered, and in the meanwhile, Amy Sutton will focus on self care including making healthy food choices, increasing physical activity and focusing on stress reduction.  Shortness of  Breath  Amy Sutton does feel that she gets out of breath more easily that she used to when she exercises. Amy Sutton's shortness of breath appears to be obesity related and exercise induced. She has agreed to work on weight loss and gradually increase exercise to treat her exercise induced shortness of breath. Will continue to monitor closely.   Problem List Items Addressed This Visit       Cardiovascular and Mediastinum   HTN (hypertension)   Patient on combination of medication valsartan -hydrochlorothiazide , amlodipine , carvedilol .  BP slightly elevated today.  Reviewed prior BP and readings are labile. No chest pain, chest pressure or headache.  Strong family history of hypertension.   CMP today        Musculoskeletal and Integument   Hidradenitis   Well controlled.  Patient reports she has not had a flair in a long  time.  Will follow up at susbsequent appointments distally.        Other   Prediabetes   Both parents are diabetic.  A1c 5 years ago 5.7.  Recent A1c within normal limits.  Will repeat A1c and Insulin level today.  Plan to discuss labs at next appointment.      H/O pre-eclampsia   Term pre- eclampsia in 2006.  Currently has IUD for birth control.  Resistant hypertension.      Other Visit Diagnoses       Other fatigue    -  Primary   Relevant Orders   EKG 12-Lead     SOBOE (shortness of breath on exertion)         Depression screening         BMI 45.0-49.9, adult (HCC)         Morbid obesity (HCC)           Amy Sutton is currently in the action stage of change and her goal is to continue with weight loss efforts. I recommend Amy Sutton begin the structured treatment plan as follows:  She has agreed to Category 4 Plan  Exercise goals: All adults should avoid inactivity. Some activity is better than none, and adults who participate in any amount of physical activity, gain some health benefits.  Behavioral modification strategies:increasing lean protein intake,  decreasing simple carbohydrates, increasing vegetables, meal planning and cooking strategies, keeping healthy foods in the home, and avoiding temptations  She was informed of the importance of frequent follow-up visits to maximize her success with intensive lifestyle modifications for her multiple health conditions. She was informed we would discuss her lab results at her next visit unless there is a critical issue that needs to be addressed sooner. Amy Sutton agreed to keep her next visit at the agreed upon time to discuss these results.  Labs ordered with plans to discuss at the next visit.   Attestation Statements:  Reviewed by clinician on day of visit: allergies, medications, problem list, medical history, surgical history, family history, social history, and previous encounter notes.  This is the patient's first visit at [facility]. The patient's NEW PATIENT PACKET was reviewed at length. Included in the packet: current and past health history, medications, allergies, ROS, gynecologic history (women only), surgical history, family history, social history, weight history, weight loss surgery history (for those that have had weight loss surgery), nutritional evaluation, mood and food questionnaire, PHQ9, Epworth questionnaire, sleep habits questionnaire, patient life and health improvement goals questionnaire. These will all be scanned into the patient's chart under media.   During the visit, I independently reviewed the patient's EKG, bioimpedance scale results, and indirect calorimeter results. I used this information to tailor a meal plan for the patient that will help her to lose weight and will improve her obesity-related conditions going forward. I performed a medically necessary appropriate examination and/or evaluation. I discussed the assessment and treatment plan with the patient. The patient was provided an opportunity to ask questions and all were answered. The patient  agreed with the plan and demonstrated an understanding of the instructions. Labs were ordered at this visit and will be reviewed at the next visit unless more critical results need to be addressed immediately. Clinical information was updated and documented in the EMR.    Donaciano Frizzle, MD

## 2024-01-15 NOTE — Assessment & Plan Note (Signed)
 Patient on combination of medication valsartan -hydrochlorothiazide , amlodipine , carvedilol .  BP slightly elevated today.  Reviewed prior BP and readings are labile. No chest pain, chest pressure or headache.  Strong family history of hypertension.   CMP today

## 2024-01-15 NOTE — Assessment & Plan Note (Signed)
 Elevated LDL and triglycerides in the past.  Will repeat FLP today.

## 2024-01-16 LAB — LIPID PANEL WITH LDL/HDL RATIO
Cholesterol, Total: 165 mg/dL (ref 100–199)
HDL: 42 mg/dL (ref >39)
LDL Chol Calc (NIH): 108 mg/dL — ABNORMAL HIGH (ref 0–99)
LDL/HDL Ratio: 2.6 ratio (ref 0.0–3.2)
Triglycerides: 80 mg/dL (ref 0–149)
VLDL Cholesterol Cal: 15 mg/dL (ref 5–40)

## 2024-01-16 LAB — CBC WITH DIFFERENTIAL/PLATELET
Basophils Absolute: 0 10*3/uL (ref 0.0–0.2)
Basos: 1 %
EOS (ABSOLUTE): 0.1 10*3/uL (ref 0.0–0.4)
Eos: 1 %
Hematocrit: 41.2 % (ref 34.0–46.6)
Hemoglobin: 13.3 g/dL (ref 11.1–15.9)
Immature Grans (Abs): 0 10*3/uL (ref 0.0–0.1)
Immature Granulocytes: 0 %
Lymphocytes Absolute: 2.2 10*3/uL (ref 0.7–3.1)
Lymphs: 37 %
MCH: 27.3 pg (ref 26.6–33.0)
MCHC: 32.3 g/dL (ref 31.5–35.7)
MCV: 84 fL (ref 79–97)
Monocytes Absolute: 0.5 10*3/uL (ref 0.1–0.9)
Monocytes: 8 %
Neutrophils Absolute: 3.1 10*3/uL (ref 1.4–7.0)
Neutrophils: 53 %
Platelets: 283 10*3/uL (ref 150–450)
RBC: 4.88 x10E6/uL (ref 3.77–5.28)
RDW: 12.7 % (ref 11.7–15.4)
WBC: 5.8 10*3/uL (ref 3.4–10.8)

## 2024-01-16 LAB — COMPREHENSIVE METABOLIC PANEL WITH GFR
ALT: 13 IU/L (ref 0–32)
AST: 15 IU/L (ref 0–40)
Albumin: 4.4 g/dL (ref 3.9–4.9)
Alkaline Phosphatase: 81 IU/L (ref 44–121)
BUN/Creatinine Ratio: 10 (ref 9–23)
BUN: 8 mg/dL (ref 6–24)
Bilirubin Total: 0.4 mg/dL (ref 0.0–1.2)
CO2: 21 mmol/L (ref 20–29)
Calcium: 10 mg/dL (ref 8.7–10.2)
Chloride: 102 mmol/L (ref 96–106)
Creatinine, Ser: 0.77 mg/dL (ref 0.57–1.00)
Globulin, Total: 2.6 g/dL (ref 1.5–4.5)
Glucose: 94 mg/dL (ref 70–99)
Potassium: 4.4 mmol/L (ref 3.5–5.2)
Sodium: 139 mmol/L (ref 134–144)
Total Protein: 7 g/dL (ref 6.0–8.5)
eGFR: 99 mL/min/{1.73_m2} (ref >59)

## 2024-01-16 LAB — HEMOGLOBIN A1C
Est. average glucose Bld gHb Est-mCnc: 114 mg/dL
Hgb A1c MFr Bld: 5.6 % (ref 4.8–5.6)

## 2024-01-16 LAB — FOLATE: Folate: 17.9 ng/mL (ref >3.0)

## 2024-01-16 LAB — VITAMIN B12: Vitamin B-12: 336 pg/mL (ref 232–1245)

## 2024-01-16 LAB — TSH: TSH: 1.7 u[IU]/mL (ref 0.450–4.500)

## 2024-01-16 LAB — VITAMIN D 25 HYDROXY (VIT D DEFICIENCY, FRACTURES): Vit D, 25-Hydroxy: 22.6 ng/mL — ABNORMAL LOW (ref 30.0–100.0)

## 2024-01-16 LAB — T4, FREE: Free T4: 1.26 ng/dL (ref 0.82–1.77)

## 2024-01-16 LAB — T3: T3, Total: 134 ng/dL (ref 71–180)

## 2024-01-16 LAB — INSULIN, RANDOM: INSULIN: 30.7 u[IU]/mL — ABNORMAL HIGH (ref 2.6–24.9)

## 2024-01-24 ENCOUNTER — Ambulatory Visit: Payer: 59 | Admitting: Family

## 2024-01-24 VITALS — BP 130/62 | HR 83 | Temp 98.8°F | Resp 16 | Ht 68.0 in | Wt 312.0 lb

## 2024-01-24 DIAGNOSIS — L732 Hidradenitis suppurativa: Secondary | ICD-10-CM

## 2024-01-24 DIAGNOSIS — R7303 Prediabetes: Secondary | ICD-10-CM

## 2024-01-24 DIAGNOSIS — I10 Essential (primary) hypertension: Secondary | ICD-10-CM

## 2024-01-24 DIAGNOSIS — M170 Bilateral primary osteoarthritis of knee: Secondary | ICD-10-CM | POA: Insufficient documentation

## 2024-01-24 DIAGNOSIS — J45909 Unspecified asthma, uncomplicated: Secondary | ICD-10-CM

## 2024-01-24 DIAGNOSIS — E7800 Pure hypercholesterolemia, unspecified: Secondary | ICD-10-CM | POA: Diagnosis not present

## 2024-01-24 MED ORDER — AMLODIPINE BESYLATE 10 MG PO TABS: 10.0000 mg | ORAL_TABLET | Freq: Every day | ORAL | 1 refills | Status: DC

## 2024-01-24 MED ORDER — VALSARTAN-HYDROCHLOROTHIAZIDE 320-25 MG PO TABS: 1.0000 | ORAL_TABLET | Freq: Every day | ORAL | 1 refills | Status: DC

## 2024-01-24 MED ORDER — ALBUTEROL SULFATE HFA 108 (90 BASE) MCG/ACT IN AERS: INHALATION_SPRAY | RESPIRATORY_TRACT | 3 refills | Status: AC

## 2024-01-24 NOTE — Assessment & Plan Note (Signed)
 Lab Results  Component Value Date   HGBA1C 5.6 01/15/2024   Sugar stable.

## 2024-01-24 NOTE — Assessment & Plan Note (Signed)
 At goal on carvedilol , amlodpine and diovan  hct. Continue same.

## 2024-01-24 NOTE — Progress Notes (Signed)
 Subjective:     Patient ID: Amy Sutton, female    DOB: [birth year], 43 y.o.   MRN: [id number]  Chief Complaint  Patient presents with   Hypertension    Here for follow up    Hypertension    Discussed the use of AI scribe software for clinical note transcription with the patient, who gave verbal consent to proceed.  History of Present Illness   Amy Sutton is a 43 year old female who presents for a regular follow-up visit.  Her hypertension is well-controlled with amlodipine  10 mg daily, carvedilol  25 mg twice a day, and Diovan  HCT. Her diabetes is stable, with her last A1c in the normal range. Cholesterol levels are within normal limits, and she is not on any cholesterol medication. Asthma is stable, and she uses her albuterol  inhaler as needed. She experiences knee pain due to arthritis and uses meloxicam  daily for relief.    Health Maintenance Due  Topic Date Due   Pneumococcal Vaccine 53-65 Years old (1 of 2 - PCV) Never done    Past Medical History:  Diagnosis Date   Asthma    Bilateral swelling of feet    Heartburn    Hidradenitis    High blood pressure    Hypertension    Low back pain    Obesity    Prediabetes     Past Surgical History:  Procedure Laterality Date   Nexplanon      Inserted 11-2017    Family History  Problem Relation Age of Onset   Obesity Mother    Arthritis Mother    Hypertension Mother    Diabetes Mother    Hyperlipidemia Mother    Obesity Father    Hypertension Father    Diabetes Father    Hyperlipidemia Father    Asthma Son    Obesity Son    Cancer Maternal Uncle        prostate   Cancer Paternal Aunt        Lymphoma    Social History   Socioeconomic History   Marital status: Single    Spouse name: Not on file   Number of children: 1   Years of education: Not on file   Highest education level: Master's degree (e.g., MA, MS, MEng, MEd, MSW, MBA)  Occupational History    Employer: [employer]   Tobacco Use   Smoking status: Never   Smokeless tobacco: Never  Vaping Use   Vaping status: Never Used  Substance and Sexual Activity   Alcohol use: Not Currently   Drug use: No   Sexual activity: Yes    Partners: Male    Birth control/protection: I.U.D.    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             Social Drivers of Corporate investment banker Strain: Low Risk  (08/17/2023)   Overall Financial Resource Strain (CARDIA)    Difficulty of Paying Living Expenses: Not very hard  Food Insecurity: No Food Insecurity (08/17/2023)   Hunger Vital Sign    Worried About Running Out of Food in the  Last Year: Never true    Ran Out of Food in the Last Year: Never true  Transportation Needs: No Transportation Needs (08/17/2023)   PRAPARE - Administrator, Civil Service (Medical): No    Lack of Transportation (Non-Medical): No  Physical Activity: Sufficiently Active (08/17/2023)   Exercise Vital Sign    Days of Exercise per Week: 3 days    Minutes of Exercise per Session: 50 min  Stress: Stress Concern Present (08/17/2023)   [employer] of Occupational Health - Occupational Stress Questionnaire    Feeling of Stress : To some extent  Social Connections: Moderately Isolated (08/17/2023)   Social Connection and Isolation Panel [NHANES]    Frequency of Communication with Friends and Family: More than three times a week    Frequency of Social Gatherings with Friends and Family: Twice a week    Attends Religious Services: More than 4 times per year    Active Member of [employer] or Organizations: No    Attends Engineer, structural: Not on file    Marital  Status: Divorced  Catering manager Violence: Not on file    Outpatient Medications Prior to Visit  Medication Sig Dispense Refill   carvedilol  (COREG ) 25 MG tablet TAKE 1 TABLET (25 MG TOTAL) BY MOUTH TWICE A DAY WITH MEALS 180 tablet 1   levonorgestrel  (MIRENA ) 20 MCG/DAY IUD 1 each by Intrauterine route once.     meloxicam  (MOBIC ) 7.5 MG tablet TAKE 1 TABLET (7.5 MG TOTAL) BY MOUTH DAILY AS NEEDED. FOR PAIN 30 tablet 5   Multiple Vitamin (MULTIVITAMIN ADULT PO) Take by mouth.     albuterol  (VENTOLIN  HFA) 108 (90 Base) MCG/ACT inhaler 2 puffs every 6 hours as needed. 6.7 g 3   amLODipine  (NORVASC ) 10 MG tablet Take 1 tablet (10 mg total) by mouth daily. 90 tablet 1   valsartan -hydrochlorothiazide  (DIOVAN -HCT) 320-25 MG tablet Take 1 tablet by mouth daily. 90 tablet 1   No facility-administered medications prior to visit.    No Known Allergies  ROS See HPI    Objective:     Physical Exam Constitutional:      Appearance: Normal appearance. She is well-developed.  Cardiovascular:     Rate and Rhythm: Normal rate and regular rhythm.     Heart sounds: Normal heart sounds. No murmur heard. Pulmonary:     Effort: Pulmonary effort is normal. No respiratory distress.     Breath sounds: Normal breath sounds. No wheezing.  Musculoskeletal:        General: No swelling.  Skin:    General: Skin is warm and dry.  Neurological:     Mental Status: She is alert.  Psychiatric:        Behavior: Behavior normal.        Thought Content: Thought content normal.        Judgment: Judgment normal.      BP 130/62 (BP Location: Right Arm, Patient Position: Sitting, Cuff Size: Large)   Pulse 83   Temp 98.8 F (37.1 C) (Oral)   Resp 16   Ht 5\' 8"  (1.727 m)   Wt (!) 312 lb (141.5 kg)   LMP 01/05/2023   SpO2 100%   BMI 47.44 kg/m  Wt Readings from Last 3 Encounters:  01/24/24 (!) 312 lb (141.5 kg)  01/15/24 (!) 311 lb (141.1 kg)  11/29/23 (!) 316 lb (143.3 kg)       Assessment &  Plan:   Problem List Items Addressed This Visit  Unprioritized   Prediabetes   Lab Results  Component Value Date   HGBA1C 5.6 01/15/2024   Sugar stable.        Osteoarthritis of both knees   Notes improvement with once daily meloxicam . Offered referral to Ortho. She prefers to focus on weight loss for now.  Monitor.       Hyperlipidemia   Lab Results  Component Value Date   CHOL 165 01/15/2024   HDL 42 01/15/2024   LDLCALC 108 (H) 01/15/2024   TRIG 80 01/15/2024   CHOLHDL 4 10/21/2022   Stable without medication.  Monitor.       Relevant Medications   valsartan -hydrochlorothiazide  (DIOVAN -HCT) 320-25 MG tablet   amLODipine  (NORVASC ) 10 MG tablet   HTN (hypertension) - Primary   At goal on carvedilol , amlodpine and diovan  hct. Continue same.       Relevant Medications   valsartan -hydrochlorothiazide  (DIOVAN -HCT) 320-25 MG tablet   amLODipine  (NORVASC ) 10 MG tablet   Hidradenitis   No recent flare ups.       Asthma   Reports well controlled. Has albuterol  on hand for prn use.       Relevant Medications   albuterol  (VENTOLIN  HFA) 108 (90 Base) MCG/ACT inhaler    I am having Amy Sutton maintain her levonorgestrel , carvedilol , meloxicam , Multiple Vitamin (MULTIVITAMIN ADULT PO), valsartan -hydrochlorothiazide , amLODipine , and albuterol .  Meds ordered this encounter  Medications   valsartan -hydrochlorothiazide  (DIOVAN -HCT) 320-25 MG tablet    Sig: Take 1 tablet by mouth daily.    Dispense:  90 tablet    Refill:  1    Supervising Provider:   Randie Bustle A [[id number]]   amLODipine  (NORVASC ) 10 MG tablet    Sig: Take 1 tablet (10 mg total) by mouth daily.    Dispense:  90 tablet    Refill:  1    Supervising Provider:   Randie Bustle A [[id number]]   albuterol  (VENTOLIN  HFA) 108 (90 Base) MCG/ACT inhaler    Sig: 2 puffs every 6 hours as needed.    Dispense:  6.7 g    Refill:  3    Supervising Provider:   Randie Bustle A [[id number]]

## 2024-01-24 NOTE — Assessment & Plan Note (Signed)
 Reports well controlled. Has albuterol  on hand for prn use.

## 2024-01-24 NOTE — Assessment & Plan Note (Signed)
 Lab Results  Component Value Date   CHOL 165 01/15/2024   HDL 42 01/15/2024   LDLCALC 108 (H) 01/15/2024   TRIG 80 01/15/2024   CHOLHDL 4 10/21/2022   Stable without medication.  Monitor.

## 2024-01-24 NOTE — Assessment & Plan Note (Signed)
 Notes improvement with once daily meloxicam . Offered referral to Ortho. She prefers to focus on weight loss for now.  Monitor.

## 2024-01-24 NOTE — Patient Instructions (Addendum)
 VISIT SUMMARY:  You had a regular follow-up visit to check on your hypertension, diabetes, cholesterol, asthma, and knee pain due to arthritis. Your conditions are generally well-controlled with your current medications.  YOUR PLAN:  OSTEOARTHRITIS: You have knee pain due to arthritis, which is currently managed with meloxicam . -Continue taking meloxicam  once daily. -Use Tylenol as needed for additional pain relief. -Avoid using Motrin. -Consider weight management before seeing a specialist.  HYPERTENSION: Your blood pressure is well-controlled with your current medications. -Continue taking amlodipine  10 mg daily. -Continue taking carvedilol  25 mg twice daily. -Continue taking Diovan  HCT. -Refills for Diovan , carvedilol , and amlodipine  have been sent.  ASTHMA: Your asthma is well-managed with no recent flare-ups. -A refill for your albuterol  inhaler has been sent.

## 2024-01-24 NOTE — Assessment & Plan Note (Signed)
No recent flare ups.   

## 2024-01-28 ENCOUNTER — Other Ambulatory Visit: Payer: Self-pay | Admitting: Family

## 2024-01-30 ENCOUNTER — Ambulatory Visit (INDEPENDENT_AMBULATORY_CARE_PROVIDER_SITE_OTHER): Admitting: Family Medicine

## 2024-01-31 ENCOUNTER — Encounter: Payer: Self-pay | Admitting: Obstetrics and Gynecology

## 2024-01-31 ENCOUNTER — Ambulatory Visit (INDEPENDENT_AMBULATORY_CARE_PROVIDER_SITE_OTHER)

## 2024-01-31 ENCOUNTER — Ambulatory Visit: Admitting: Obstetrics and Gynecology

## 2024-01-31 VITALS — BP 114/70 | HR 75

## 2024-01-31 DIAGNOSIS — E282 Polycystic ovarian syndrome: Secondary | ICD-10-CM | POA: Diagnosis not present

## 2024-01-31 DIAGNOSIS — R102 Pelvic and perineal pain: Secondary | ICD-10-CM

## 2024-01-31 DIAGNOSIS — Z975 Presence of (intrauterine) contraceptive device: Secondary | ICD-10-CM | POA: Diagnosis not present

## 2024-01-31 DIAGNOSIS — Z712 Person consulting for explanation of examination or test findings: Secondary | ICD-10-CM

## 2024-01-31 DIAGNOSIS — Z01419 Encounter for gynecological examination (general) (routine) without abnormal findings: Secondary | ICD-10-CM

## 2024-01-31 NOTE — Progress Notes (Signed)
   Acute Office Visit  Subjective:    Patient ID: Amy Sutton, female    DOB: 05/18/1981, 43 y.o.   MRN: [id number]   HPI 43 y.o. presents today for No chief complaint on file.  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  Son wants to go into same field. Her  parents have moved here to help her and she takes care of them.     HPI: 12/06/22 changed to mirena  IUD d/t HBP.  Doing well on it but having mild cramping each month.  Periods were always irregular in the past. Obesity is another factor which could be helped by stopping the Depo Provera .   Pleased with the IUD so far No pelvic pain.  Pap Neg/HPV Neg 11/2021.   11/28/22 MMG birads 1 neg. Colonoscopy not done. Begin at 67 with no risks.Aaron Aas BMI 45.58 last year. Planning to improve diet and exercise more. Health labs with Fam MD and at wellness clinic. Sent in zep [facility] to [facility]-cannot afford. In healthy lifestyle program with [facility].  Patient's last menstrual period was 01/05/2023.    Review of Systems     Objective:     OBGyn Exam  BP 114/70   Pulse 75   LMP 01/05/2023   SpO2 99%  Wt Readings from Last 3 Encounters:  01/24/24 (!) 312 lb (141.5 kg)  01/15/24 (!) 311 lb (141.1 kg)  11/29/23 (!) 316 lb (143.3 kg)       PUS 7.88cm uterus Endometrial lining 5.65mm IUD noted centrally within the EM canal Normal ovaries   Assessment & Plan:  PUS results US  results reviewed and discussed with patient and normal with IUD in correct placement. Continue with diet and exercise  10 minutes spent on reviewing records, imaging,  and one on one patient time and counseling patient and documentation Dr. Caro Christmas

## 2024-02-01 ENCOUNTER — Ambulatory Visit (INDEPENDENT_AMBULATORY_CARE_PROVIDER_SITE_OTHER): Admitting: Family Medicine

## 2024-02-01 ENCOUNTER — Encounter (INDEPENDENT_AMBULATORY_CARE_PROVIDER_SITE_OTHER): Payer: Self-pay | Admitting: Family Medicine

## 2024-02-01 VITALS — BP 125/80 | HR 83 | Temp 98.3°F | Ht 68.0 in | Wt 304.0 lb

## 2024-02-01 DIAGNOSIS — I10 Essential (primary) hypertension: Secondary | ICD-10-CM

## 2024-02-01 DIAGNOSIS — R7303 Prediabetes: Secondary | ICD-10-CM | POA: Diagnosis not present

## 2024-02-01 DIAGNOSIS — E7800 Pure hypercholesterolemia, unspecified: Secondary | ICD-10-CM

## 2024-02-01 DIAGNOSIS — E785 Hyperlipidemia, unspecified: Secondary | ICD-10-CM

## 2024-02-01 DIAGNOSIS — E559 Vitamin D deficiency, unspecified: Secondary | ICD-10-CM | POA: Diagnosis not present

## 2024-02-01 DIAGNOSIS — Z6841 Body Mass Index (BMI) 40.0 and over, adult: Secondary | ICD-10-CM

## 2024-02-01 NOTE — Assessment & Plan Note (Signed)

## 2024-02-01 NOTE — Assessment & Plan Note (Signed)
 The 10-year ASCVD risk score (Arnett DK, et al., 2019) is: 2.2%   Values used to calculate the score:     Age: 43 years     Sex: Female     Is Non-Hispanic African American: Yes     Diabetic: No     Tobacco smoker: No     Systolic Blood Pressure: 125 mmHg     Is BP treated: Yes     HDL Cholesterol: 42 mg/dL     Total Cholesterol: 165 mg/dL  Low risk- lifestyle changes of limiting saturated fat to be less than 20% of total daily intake encouraged.

## 2024-02-01 NOTE — Assessment & Plan Note (Signed)
 Discussed importance of vitamin d supplementation.  Vitamin d supplementation has been shown to decrease fatigue, decrease risk of progression to insulin resistance and then prediabetes, decreases risk of falling in older age and can even assist in decreasing depressive symptoms in PTSD.   Prescription for Vitamin D sent in.

## 2024-02-01 NOTE — Progress Notes (Signed)
 SUBJECTIVE:  Chief Complaint: Obesity  Interim History: Patient felt the first few weeks were a big adjustment.  She found herself snacking significantly less throughout the day and at night.  She was not able to get all the food in but was able to get most of it in. Breakfast was good and easy for patient to stay consistent with- did yogurt and sausage and fruit.  Did not eat bread or cheese.  Lunch was salad and sandwich and occasionally did yogurt if she was still hungry.  Dinner was ground [place] and vegetables (able to eat about 6 oz)- occasionally did a low carb tortilla and water.  Has a few indulgent choices- pizza, chipotle.  She had a couple of smoothies that were keto with protein.   Amy Sutton is here to discuss her progress with her obesity treatment plan. She is on the Category 4 Plan and states she is following her eating plan approximately 95 % of the time. She states she is exercising 60 minutes 4-5 times per week.   OBJECTIVE: Visit Diagnoses: Problem List Items Addressed This Visit       Cardiovascular and Mediastinum   HTN (hypertension)   Blood pressure well controlled today.  No chest pain, chest pressure or headache.  Patient is on diovan -hydrochlorothiazide , coreg  and norvasc .  Continue to follow BP and titrate medications as tolerated.        Other   Prediabetes - Primary   Pathophysiology of progression through insulin  resistance to prediabetes and diabetes was discussed at length today.  Patient to continue to monitor and be in control of total intake of snack calories which may be simple carbohydrates but should be consumed only after the patient has taken in all the nutrition for the day.  Macronutrient identification, classification and daily intake ratios were discussed.  Plan to repeat labs in 3 months to monitor both hemoglobin A1c and insulin  levels.  No medications at this time as patient is not having significant hunger or cravings that would make following  meal plan more difficult.         Hyperlipidemia   The 10-year ASCVD risk score (Arnett DK, et al., 2019) is: 2.2%   Values used to calculate the score:     Age: 43 years     Sex: Female     Is Non-Hispanic African American: Yes     Diabetic: No     Tobacco smoker: No     Systolic Blood Pressure: 125 mmHg     Is BP treated: Yes     HDL Cholesterol: 42 mg/dL     Total Cholesterol: 165 mg/dL  Low risk- lifestyle changes of limiting saturated fat to be less than 20% of total daily intake encouraged.      Vitamin D  deficiency   Discussed importance of vitamin d  supplementation.  Vitamin d  supplementation has been shown to decrease fatigue, decrease risk of progression to insulin  resistance and then prediabetes, decreases risk of falling in older age and can even assist in decreasing depressive symptoms in PTSD.   Prescription for Vitamin D  sent in.        Other Visit Diagnoses       Morbid obesity (HCC)         BMI 45.0-49.9, adult (HCC)           Vitals Temp: 98.3 F (36.8 C) BP: 125/80 Pulse Rate: 83 SpO2: 99 %   Anthropometric Measurements Height: 5\' 8"  (1.727 m) Weight: (!) 304 lb (  137.9 kg) BMI (Calculated): 46.23 Weight at Last Visit: 311 lb Weight Lost Since Last Visit: 7 lb Weight Gained Since Last Visit: 0 Starting Weight: 311 lb Total Weight Loss (lbs): 7 lb (3.175 kg) Peak Weight: 315 lb   Body Composition  Body Fat %: 51.7 % Fat Mass (lbs): 157.2 lbs Muscle Mass (lbs): 139.6 lbs Total Body Water (lbs): 106.6 lbs Visceral Fat Rating : 16   Other Clinical Data Today's Visit #: 2 Starting Date: 01/15/24 Comments: Cat 4     ASSESSMENT AND PLAN:  Diet: Amy Sutton is currently in the action stage of change. As such, her goal is to continue with weight loss efforts and has agreed to the Category 4 Plan and keeping a food journal and adhering to recommended goals of 1700-1800 calories and 125 or more grams of protein daily.   Exercise:  For  substantial health benefits, adults should do at least 150 minutes (2 hours and 30 minutes) a week of moderate-intensity, or 75 minutes (1 hour and 15 minutes) a week of vigorous-intensity aerobic physical activity, or an equivalent combination of moderate- and vigorous-intensity aerobic activity. Aerobic activity should be performed in episodes of at least 10 minutes, and preferably, it should be spread throughout the week.  Behavior Modification:  We discussed the following Behavioral Modification Strategies today: increasing lean protein intake, decreasing simple carbohydrates, increasing vegetables, meal planning and cooking strategies, keeping healthy foods in the home, and planning for success.   Return in about 3 weeks (around 02/22/2024).   She was informed of the importance of frequent follow up visits to maximize her success with intensive lifestyle modifications for her multiple health conditions.  Attestation Statements:   Reviewed by clinician on day of visit: allergies, medications, problem list, medical history, surgical history, family history, social history, and previous encounter notes.     Donaciano Frizzle, MD

## 2024-02-01 NOTE — Assessment & Plan Note (Signed)
 Blood pressure well controlled today.  No chest pain, chest pressure or headache.  Patient is on diovan -hydrochlorothiazide , coreg  and norvasc .  Continue to follow BP and titrate medications as tolerated.

## 2024-03-07 ENCOUNTER — Encounter: Payer: Self-pay | Admitting: Sports Medicine

## 2024-03-07 ENCOUNTER — Ambulatory Visit: Admitting: Sports Medicine

## 2024-03-07 ENCOUNTER — Ambulatory Visit (INDEPENDENT_AMBULATORY_CARE_PROVIDER_SITE_OTHER): Admitting: Family Medicine

## 2024-03-07 ENCOUNTER — Encounter (INDEPENDENT_AMBULATORY_CARE_PROVIDER_SITE_OTHER): Payer: Self-pay | Admitting: Family Medicine

## 2024-03-07 ENCOUNTER — Ambulatory Visit (HOSPITAL_BASED_OUTPATIENT_CLINIC_OR_DEPARTMENT_OTHER)
Admission: RE | Admit: 2024-03-07 | Discharge: 2024-03-07 | Disposition: A | Discharge status: Home / Self Care | Source: Ambulatory Visit | Attending: Sports Medicine | Admitting: Sports Medicine

## 2024-03-07 ENCOUNTER — Other Ambulatory Visit (HOSPITAL_BASED_OUTPATIENT_CLINIC_OR_DEPARTMENT_OTHER): Payer: Self-pay

## 2024-03-07 VITALS — BP 130/80 | Ht 68.0 in | Wt 301.0 lb

## 2024-03-07 VITALS — BP 135/85 | HR 85 | Temp 98.1°F | Ht 68.0 in | Wt 301.0 lb

## 2024-03-07 DIAGNOSIS — M25561 Pain in right knee: Secondary | ICD-10-CM | POA: Diagnosis present

## 2024-03-07 DIAGNOSIS — M1711 Unilateral primary osteoarthritis, right knee: Secondary | ICD-10-CM | POA: Diagnosis not present

## 2024-03-07 DIAGNOSIS — R7303 Prediabetes: Secondary | ICD-10-CM

## 2024-03-07 DIAGNOSIS — Z6841 Body Mass Index (BMI) 40.0 and over, adult: Secondary | ICD-10-CM

## 2024-03-07 DIAGNOSIS — E559 Vitamin D deficiency, unspecified: Secondary | ICD-10-CM

## 2024-03-07 MED ORDER — METHYLPREDNISOLONE ACETATE 40 MG/ML IJ SUSP
40.0000 mg | Freq: Once | INTRAMUSCULAR | Status: AC
  Administered 2024-03-07: 40 mg via INTRA_ARTICULAR

## 2024-03-07 MED ORDER — VITAMIN D (ERGOCALCIFEROL) 1.25 MG (50000 UNIT) PO CAPS
50000.0000 [IU] | ORAL_CAPSULE | ORAL | 0 refills | Status: DC
  Filled 2024-03-07: qty 12, 84d supply, fill #0

## 2024-03-07 MED ORDER — VITAMIN D (ERGOCALCIFEROL) 1.25 MG (50000 UNIT) PO CAPS: 50000.0000 [IU] | ORAL_CAPSULE | ORAL | 0 refills | Status: DC

## 2024-03-07 NOTE — Progress Notes (Signed)
 SUBJECTIVE:  Chief Complaint: Obesity  Interim History: Patient voices June was busier than she anticipated.  She went back home to [place]  and ate out more frequently than she wanted to.  She is trying to get back on plan now.  She has no plans for July 4th.  Her birthday is coming up and she is planning to go out to eat. She is also planning on prepping her son to go back to school.  She is looking for alternatives to some of the foods she is eating.  Artemis is here to discuss her progress with her obesity treatment plan. She is on the Category 4 Plan and states she is following her eating plan approximately 75 % of the time. She states she is exercising 60 minutes 4 times per week.   OBJECTIVE: Visit Diagnoses: Problem List Items Addressed This Visit       Other   Prediabetes   Vitamin D  deficiency - Primary   Relevant Medications   Vitamin D , Ergocalciferol , (DRISDOL ) 1.25 MG (50000 UNIT) CAPS capsule   Other Visit Diagnoses       Morbid obesity (HCC)         BMI 45.0-49.9, adult (HCC)           No data recorded      03/07/2024    9:58 AM 03/07/2024    8:00 AM 02/01/2024   11:00 AM  Vitals with BMI  Height 5' 8 5' 8 5' 8  Weight 301 lbs 301 lbs 304 lbs  BMI 45.78 45.78 46.23  Systolic 130 135 874  Diastolic 80 85 80  Pulse  85 83       ASSESSMENT AND PLAN: Assessment & Plan Vitamin D  deficiency Doing well on prescription strength Vitamin D  and needs a refill today.  No change in therapy at this time. Prediabetes Patient is working on controlling intake of simple carbohydrates with focusing on increasing total protein intake.  Continue either of meal plan options at this time with lab re-evaluation in 3 months. Morbid obesity (HCC) Anthropometric Measurements Height: 5' 8 (1.727 m) Weight: (!) 301 lb (136.5 kg) BMI (Calculated): 45.78 Weight at Last Visit: 304 lb Weight Lost Since Last Visit: 3 Weight Gained Since Last Visit: 0 Starting Weight:  311 lb Total Weight Loss (lbs): 10 lb (4.536 kg) Body Composition  Body Fat %: 51.9 % Fat Mass (lbs): 156.4 lbs Muscle Mass (lbs): 107 lbs Total Body Water (lbs): 107 lbs Visceral Fat Rating : 16 Other Clinical Data Fasting: yes Labs: no Today's Visit #: 3 Starting Date: 01/15/24 Comments: Cat 4  BMI 45.0-49.9, adult (HCC)    Diet: Sundus is currently in the action stage of change. As such, her goal is to continue with weight loss efforts and has agreed to the Category 4 Plan and keeping a food journal and adhering to recommended goals of 1700-1800 calories and 125 or more grams of protein daily.   Exercise:  All adults should avoid inactivity. Some activity is better than none, and adults who participate in any amount of physical activity, gain some health benefits.  Behavior Modification:  We discussed the following Behavioral Modification Strategies today: increasing lean protein intake, decreasing simple carbohydrates, increasing vegetables, and increase H2O intake.   Return in about 3 weeks (around 03/28/2024).   She was informed of the importance of frequent follow up visits to maximize her success with intensive lifestyle modifications for her multiple health conditions.  Attestation Statements:   Reviewed  by clinician on day of visit: allergies, medications, problem list, medical history, surgical history, family history, social history, and previous encounter notes.     Adelita Cho, MD

## 2024-03-07 NOTE — Progress Notes (Signed)
   Subjective:    Patient ID: Amy Sutton, female    DOB: 06-20-1981, 43 y.o.   MRN: [id number]  HPI chief complaint: Right knee pain  Patient is a very pleasant 43 year old female that presents today with acute right knee pain that began a week and a half ago when participating in a Zumba class.  When landing from a jump, she felt a sharp pain in the lateral right knee with radiating pain up into the lateral hip.  She has noticed some stiffness in the knee as well as pain that is worse with walking and walking upstairs.  She does endorse pain at night when trying to sleep.  She has had problems with this same knee in the past.  An x-ray in 2015 showed lateral compartmental DJD.  No prior knee surgeries.  She denies any groin pain.  She has tried Tylenol, Tiger balm, and ice.  Past medical history reviewed Medications reviewed Allergies reviewed  Review of Systems As above    Objective:   Physical Exam  Well-developed, well-nourished.  No acute distress  Right knee: Valgus malalignment when standing.  Trace effusion.  There is no tenderness to palpation along medial or lateral joint lines.  Negative McMurray's.  Knee is grossly stable to ligamentous exam.  Neurovascularly intact distally.  X-rays of the right knee including AP, lateral, and sunrise views show mild to moderate lateral and patellofemoral DJD.  Nothing acute.    Assessment & Plan:   Right knee pain secondary to DJD  Right knee is injected today with cortisone utilizing an anterior medial approach.  She tolerates this without difficulty.  She has purchased a compression sleeve which she will wear throughout the course of the day when active.  She is shown a couple of quad strengthening exercises to be done daily at home.  She may resume her exercise routine as her pain allows but I did recommend against any high impact exercise such as running and jumping going forward.  If her symptoms persist despite today's treatment,  then consider viscosupplementation and formal physical therapy.  Follow-up for ongoing or recalcitrant issues.  Consent obtained and verified. Time-out conducted. Noted no overlying erythema, induration, or other signs of local infection. Skin prepped in a sterile fashion. Topical analgesic spray: Ethyl chloride. Joint: Right knee Needle: 25-gauge 1.5 inch Completed without difficulty. Meds: 3 cc 1% Xylocaine , 1 cc (40 mg) Depo-Medrol   This note was dictated using Dragon naturally speaking software and may contain errors in syntax, spelling, or content which have not been identified prior to signing this note.

## 2024-03-17 NOTE — Assessment & Plan Note (Signed)
 Patient is working on controlling intake of simple carbohydrates with focusing on increasing total protein intake.  Continue either of meal plan options at this time with lab re-evaluation in 3 months.

## 2024-03-17 NOTE — Assessment & Plan Note (Signed)
 Doing well on prescription strength Vitamin D  and needs a refill today.  No change in therapy at this time.

## 2024-04-09 ENCOUNTER — Encounter (INDEPENDENT_AMBULATORY_CARE_PROVIDER_SITE_OTHER): Payer: Self-pay | Admitting: Family Medicine

## 2024-04-09 ENCOUNTER — Ambulatory Visit (INDEPENDENT_AMBULATORY_CARE_PROVIDER_SITE_OTHER): Admitting: Family Medicine

## 2024-04-09 VITALS — BP 127/78 | HR 82 | Temp 97.7°F | Ht 68.0 in | Wt 299.0 lb

## 2024-04-09 DIAGNOSIS — I10 Essential (primary) hypertension: Secondary | ICD-10-CM

## 2024-04-09 DIAGNOSIS — Z6841 Body Mass Index (BMI) 40.0 and over, adult: Secondary | ICD-10-CM

## 2024-04-09 DIAGNOSIS — R7303 Prediabetes: Secondary | ICD-10-CM

## 2024-04-09 NOTE — Progress Notes (Signed)
 SUBJECTIVE:  Chief Complaint: Obesity  Interim History: Since last appointment patient celebrated her birthday.  She mentioned she went out to eat at [facility] Tuesdays, [employer], [employer].  Patient's son did not get housing on campus and she is trying to figure out how to get him too and from school because she is working. School starts for her son on August 20th.  Aside from her birthday she has been sticking with the plan about 50% of the time.  She is still doing yogurt and [place] sausage in the am.  Joaquin is still hit or miss as she isn't always hungry and will then grab something from fast food.  She is going to [employer], [employer], [employer], [employer].  She tends to like sauces on her food and is trying to figure out how to save calories for sauces.  Matteson is here to discuss her progress with her obesity treatment plan. She is on the Category 4 Plan and states she is following her eating plan approximately 50 % of the time. She states she is exercising 60 minutes 3 times per week.   OBJECTIVE: Visit Diagnoses: Problem List Items Addressed This Visit       Cardiovascular and Mediastinum   HTN (hypertension)     Other   Prediabetes - Primary   Other Visit Diagnoses       Morbid obesity (HCC)         BMI 45.0-49.9, adult (HCC)           No data recorded  No data recorded  No data recorded  No data recorded    ASSESSMENT AND PLAN: Assessment & Plan Prediabetes Patient is continuing to work on dietary intake changes.  Previous A1c in May at goal.  Will continue current meal plan with focus on protein consumption and limiting simple carbohydrates. Primary hypertension Blood pressure well controlled today.  No chest pain, chest pressure or headache.  Patient is on amlodipine , carvedilol , diovan - hct today.  Continue current meds at current doses.  Follow up on BP at next appointment. Morbid obesity (HCC)  BMI 45.0-49.9, adult (HCC)    Diet: Caryl is  currently in the action stage of change. As such, her goal is to continue with weight loss efforts and has agreed to the Category 4 Plan and keeping a food journal and adhering to recommended goals of 550-650 calories and 50 or more grams protein for dinner.   Exercise:  For additional and more extensive health benefits, adults should increase their aerobic physical activity to 300 minutes (5 hours) a week of moderate-intensity, or 150 minutes a week of vigorous-intensity aerobic physical activity, or an equivalent combination of moderate- and vigorous-intensity activity. Additional health benefits are gained by engaging in physical activity beyond this amount.  and Adults should also include muscle-strengthening activities that involve all major muscle groups on 2 or more days a week.  Behavior Modification:  We discussed the following Behavioral Modification Strategies today: increasing lean protein intake, decreasing simple carbohydrates, increasing vegetables, meal planning and cooking strategies, keeping healthy foods in the home, planning for success, and keep a strict food journal.   Return in about 3 weeks (around 04/30/2024).   She was informed of the importance of frequent follow up visits to maximize her success with intensive lifestyle modifications for her multiple health conditions.  Attestation Statements:   Reviewed by clinician on day of visit: allergies, medications, problem list, medical history, surgical history, family history, social history, and previous  encounter notes.   Adelita Cho, MD

## 2024-04-17 NOTE — Assessment & Plan Note (Signed)
 Blood pressure well controlled today.  No chest pain, chest pressure or headache.  Patient is on amlodipine , carvedilol , diovan - hct today.  Continue current meds at current doses.  Follow up on BP at next appointment.

## 2024-04-17 NOTE — Assessment & Plan Note (Signed)
 Patient is continuing to work on dietary intake changes.  Previous A1c in May at goal.  Will continue current meal plan with focus on protein consumption and limiting simple carbohydrates.

## 2024-04-26 ENCOUNTER — Other Ambulatory Visit: Payer: Self-pay | Admitting: Family

## 2024-04-30 ENCOUNTER — Telehealth: Payer: Self-pay | Admitting: Family

## 2024-04-30 ENCOUNTER — Ambulatory Visit (INDEPENDENT_AMBULATORY_CARE_PROVIDER_SITE_OTHER): Admitting: Family Medicine

## 2024-04-30 NOTE — Telephone Encounter (Signed)
 See mychart.

## 2024-05-01 ENCOUNTER — Encounter (INDEPENDENT_AMBULATORY_CARE_PROVIDER_SITE_OTHER): Payer: Self-pay | Admitting: Family Medicine

## 2024-05-01 ENCOUNTER — Ambulatory Visit (INDEPENDENT_AMBULATORY_CARE_PROVIDER_SITE_OTHER): Admitting: Family Medicine

## 2024-05-01 VITALS — BP 135/81 | HR 91 | Temp 98.4°F | Ht 68.0 in | Wt 302.0 lb

## 2024-05-01 DIAGNOSIS — Z6841 Body Mass Index (BMI) 40.0 and over, adult: Secondary | ICD-10-CM

## 2024-05-01 DIAGNOSIS — I10 Essential (primary) hypertension: Secondary | ICD-10-CM

## 2024-05-01 NOTE — Assessment & Plan Note (Addendum)
 Blood pressure well controlled today.  Patient is on amlodipine , coreg  and diovan -hydrochlorothiazide  and is not experiencing any headache, chest pain, chest pressure, dizziness or lightheadedness.  Needs no refills today.  Continue to monitor BP at next appointments.

## 2024-05-01 NOTE — Progress Notes (Signed)
 SUBJECTIVE:  Chief Complaint: Obesity  Interim History: Patient has a relatively good August- relaxing.  She hasn't been meal planning as well as she wanted to.  Biggest obstacle has been preparing food. She is looking to do a meal at dinner that her family can all eat on.  Family does eat soup and stews.  No plans for Labor Day weekend.   Amy Sutton is here to discuss her progress with her obesity treatment plan. She is on the Category 4 Plan and states she is following her eating plan approximately 85 % of the time. She states she is exercising 60 minutes 3 times per week.   OBJECTIVE: Visit Diagnoses: Problem List Items Addressed This Visit       Cardiovascular and Mediastinum   HTN (hypertension) - Primary   Blood pressure well controlled today.  Patient is on amlodipine , coreg  and diovan -hydrochlorothiazide  and is not experiencing any headache, chest pain, chest pressure, dizziness or lightheadedness.  Needs no refills today.  Continue to monitor BP at next appointments.       Vitals Temp: 98.4 F (36.9 C) BP: 135/81 Pulse Rate: 91 SpO2: 100 %   Anthropometric Measurements Height: 5' 8 (1.727 m) Weight: (!) 302 lb (137 kg) BMI (Calculated): 45.93 Weight at Last Visit: 299 lb Weight Lost Since Last Visit: 0 Weight Gained Since Last Visit: 3 Starting Weight: 311 lb   Body Composition  Body Fat %: 51.8 % Fat Mass (lbs): 156.8 lbs Muscle Mass (lbs): 138.6 lbs Total Body Water (lbs): 110.4 lbs Visceral Fat Rating : 16   Other Clinical Data Today's Visit #: 5 Starting Date: 01/15/24 Comments: Cat 4     ASSESSMENT AND PLAN: Assessment & Plan Primary hypertension Blood pressure well controlled today.  Patient is on amlodipine , coreg  and diovan -hydrochlorothiazide  and is not experiencing any headache, chest pain, chest pressure, dizziness or lightheadedness.  Needs no refills today.  Continue to monitor BP at next appointments. BMI 45.0-49.9, adult  [facility])  Morbid obesity (HCC)    Diet: Meghin is currently in the action stage of change. As such, her goal is to continue with weight loss efforts and has agreed to the Category 4 Plan and keeping a food journal and adhering to recommended goals of 450-600 calories and 40 or more grams of protein for lunch and 550-700 calories and 50 or more grams of protein for dinner.  Handouts of recipes given as was the journaling handout.  She is interested in total calories for the day- 1900-2000 calories and 150 or more grams of protein daily.   Exercise:  For additional and more extensive health benefits, adults should increase their aerobic physical activity to 300 minutes (5 hours) a week of moderate-intensity, or 150 minutes a week of vigorous-intensity aerobic physical activity, or an equivalent combination of moderate- and vigorous-intensity activity. Additional health benefits are gained by engaging in physical activity beyond this amount.   Behavior Modification:  We discussed the following Behavioral Modification Strategies today: increasing lean protein intake, decreasing simple carbohydrates, increasing vegetables, meal planning and cooking strategies, keeping healthy foods in the home, and planning for success.   Return in about 3 weeks (around 05/22/2024).   She was informed of the importance of frequent follow up visits to maximize her success with intensive lifestyle modifications for her multiple health conditions.  Attestation Statements:   Reviewed by clinician on day of visit: allergies, medications, problem list, medical history, surgical history, family history, social history, and previous encounter notes.  Adelita Cho, MD

## 2024-05-22 ENCOUNTER — Other Ambulatory Visit (INDEPENDENT_AMBULATORY_CARE_PROVIDER_SITE_OTHER): Payer: Self-pay | Admitting: Family Medicine

## 2024-05-22 DIAGNOSIS — E559 Vitamin D deficiency, unspecified: Secondary | ICD-10-CM

## 2024-05-23 ENCOUNTER — Ambulatory Visit (INDEPENDENT_AMBULATORY_CARE_PROVIDER_SITE_OTHER): Admitting: Family Medicine

## 2024-05-23 ENCOUNTER — Encounter (INDEPENDENT_AMBULATORY_CARE_PROVIDER_SITE_OTHER): Payer: Self-pay | Admitting: Family Medicine

## 2024-05-23 VITALS — BP 142/81 | HR 82 | Temp 98.2°F | Ht 68.0 in | Wt 299.0 lb

## 2024-05-23 DIAGNOSIS — Z6841 Body Mass Index (BMI) 40.0 and over, adult: Secondary | ICD-10-CM

## 2024-05-23 DIAGNOSIS — E559 Vitamin D deficiency, unspecified: Secondary | ICD-10-CM | POA: Diagnosis not present

## 2024-05-23 DIAGNOSIS — I10 Essential (primary) hypertension: Secondary | ICD-10-CM | POA: Diagnosis not present

## 2024-05-23 MED ORDER — VITAMIN D (ERGOCALCIFEROL) 1.25 MG (50000 UNIT) PO CAPS: 50000.0000 [IU] | ORAL_CAPSULE | ORAL | 0 refills | Status: DC

## 2024-05-23 NOTE — Progress Notes (Signed)
 SUBJECTIVE:  Chief Complaint: Obesity  Interim History: Patient brought food journal with her today.  She has a variety of intake calories and protein- calories ranged from 1050 calories to 1800 calories and protein has been easily at goal for the calorie allotment.  Next few weeks she has a few events.  This weekend she is going to celebrate her aunt's 90th birthday in [place] .  In October she is going to [place] to celebrate her friend's birthday.  She is also going out to eat in [place] as well. She is finding that when eating out she is looking at the nutrition facts available to her.  Amy Sutton is here to discuss her progress with her obesity treatment plan. She is on the Category 4 Plan and keeping a food journal and states she is following her eating plan approximately 85 % of the time. She states she is exercising 60 minutes 3 times per week- tabata on Tuesday, Monday is strength training and Wednesday is dance.    OBJECTIVE: Visit Diagnoses: Problem List Items Addressed This Visit       Other   Vitamin D  deficiency   Relevant Medications   Vitamin D , Ergocalciferol , (DRISDOL ) 1.25 MG (50000 UNIT) CAPS capsule    Vitals Temp: 98.2 F (36.8 C) BP: (!) 142/81 Pulse Rate: 82 SpO2: 100 %   Anthropometric Measurements Height: 5' 8 (1.727 m) Weight: 299 lb (135.6 kg) BMI (Calculated): 45.47 Weight at Last Visit: 302 lb Weight Lost Since Last Visit: 3 Weight Gained Since Last Visit: 0 Starting Weight: 311 lb Total Weight Loss (lbs): 12 lb (5.443 kg)   Body Composition  Body Fat %: 52.7 % Fat Mass (lbs): 157.8 lbs Muscle Mass (lbs): 134.4 lbs Visceral Fat Rating : 17   Other Clinical Data Today's Visit #: 6 Starting Date: 01/15/24 Comments: Cat 4, journal     ASSESSMENT AND PLAN: Assessment & Plan Vitamin D  deficiency Patient on prescription strength vitamin D  with no nausea, vomiting, or muscle weakness reported.  Most recent vitamin D  level in May  of this year 22.6.  Needs a refill of prescription strength weekly vitamin D  supplementation today. Primary hypertension Blood pressure slightly elevated today.  No chest pain, chest pressure, or headache reported.  Patient on Diovan  hydrochlorothiazide , carvedilol , and amlodipine .  Previous blood pressure well-controlled.  No change in current treatment in terms of medication or dosage. Morbid obesity (HCC)  BMI 45.0-49.9, adult (HCC)    Diet: Amy Sutton is currently in the action stage of change. As such, her goal is to continue with weight loss efforts and has agreed to keeping a food journal and adhering to recommended goals of 1700-1800 calories and 125 or more grams of protein.  Patient to start food log or journaling meal plan.  The initial goal will be to habitually log or journal for at least 4 days a week.  The expectation it that patient may not initially meet calorie or protein goals as the nutritional understanding of food intake is begun.  We discussed the 10:1 ratio when reading a food label.  Patient agrees to keep a food log either electronically or on paper and bring to the next appointment to be able to dissect and discuss it with provider.    Exercise:  For substantial health benefits, adults should do at least 150 minutes (2 hours and 30 minutes) a week of moderate-intensity, or 75 minutes (1 hour and 15 minutes) a week of vigorous-intensity aerobic physical activity, or an equivalent combination  of moderate- and vigorous-intensity aerobic activity. Aerobic activity should be performed in episodes of at least 10 minutes, and preferably, it should be spread throughout the week.  Behavior Modification:  We discussed the following Behavioral Modification Strategies today: increasing lean protein intake, decreasing simple carbohydrates, increasing vegetables, meal planning and cooking strategies, and planning for success.   Return in about 4 weeks (around 06/20/2024).   She was  informed of the importance of frequent follow up visits to maximize her success with intensive lifestyle modifications for her multiple health conditions.  Attestation Statements:   Reviewed by clinician on day of visit: allergies, medications, problem list, medical history, surgical history, family history, social history, and previous encounter notes.   Amy Cho, MD

## 2024-06-01 NOTE — Assessment & Plan Note (Signed)
 Patient on prescription strength vitamin D  with no nausea, vomiting, or muscle weakness reported.  Most recent vitamin D  level in May of this year 22.6.  Needs a refill of prescription strength weekly vitamin D  supplementation today.

## 2024-06-01 NOTE — Assessment & Plan Note (Signed)
 Blood pressure slightly elevated today.  No chest pain, chest pressure, or headache reported.  Patient on Diovan  hydrochlorothiazide , carvedilol , and amlodipine .  Previous blood pressure well-controlled.  No change in current treatment in terms of medication or dosage.

## 2024-07-01 ENCOUNTER — Ambulatory Visit (INDEPENDENT_AMBULATORY_CARE_PROVIDER_SITE_OTHER): Admitting: Family Medicine

## 2024-07-01 ENCOUNTER — Encounter (INDEPENDENT_AMBULATORY_CARE_PROVIDER_SITE_OTHER): Payer: Self-pay | Admitting: Family Medicine

## 2024-07-01 VITALS — BP 140/84 | HR 89 | Temp 98.1°F | Ht 68.0 in | Wt 293.0 lb

## 2024-07-01 DIAGNOSIS — Z6841 Body Mass Index (BMI) 40.0 and over, adult: Secondary | ICD-10-CM

## 2024-07-01 DIAGNOSIS — I10 Essential (primary) hypertension: Secondary | ICD-10-CM

## 2024-07-01 DIAGNOSIS — R7303 Prediabetes: Secondary | ICD-10-CM

## 2024-07-01 NOTE — Progress Notes (Signed)
   SUBJECTIVE:  Chief Complaint: Obesity  Interim History: Patient has been busy with work and hasn't been consistent with her journaling.  She mentions mentally she has been trying to stay mindful of her calorie intake.  She is trying to stay in control of her total intake.  She is still exercising three times a week.  Lately she hasn't been feeling like she wants to eat.  She is dealing with quite a bit of congesting.  She thinks ultimately she is ending up light on calories. Work is likely going to be as demanding over the next month.    Amy Sutton is here to discuss her progress with her obesity treatment plan. She is on the keeping a food journal and adhering to recommended goals of 1700-1800 calories and 125 grams of protein and states she is following her eating plan approximately 75% of the time. She states she is exercising 45 minutes 3 times per week.   OBJECTIVE: Visit Diagnoses: Problem List Items Addressed This Visit   None   Vitals Temp: 98.1 F (36.7 C) BP: (!) 140/84 Pulse Rate: 89 SpO2: 99 %   Anthropometric Measurements Height: 5' 8 (1.727 m) Weight: 293 lb (132.9 kg) BMI (Calculated): 44.56 Weight at Last Visit: 299 lb Weight Lost Since Last Visit: 6 Weight Gained Since Last Visit: 0 Starting Weight: 311 lb Total Weight Loss (lbs): 18 lb (8.165 kg)   Body Composition  Body Fat %: 50.3 % Fat Mass (lbs): 147.6 lbs Muscle Mass (lbs): 138.6 lbs Total Body Water (lbs): 107.6 lbs Visceral Fat Rating : 15   Other Clinical Data Today's Visit #: 7 Starting Date: 01/15/24 Comments: 1700-1800/125     ASSESSMENT AND PLAN: Assessment & Plan Prediabetes Patient is working on limiting simple carbohydrates and increasing total protein intake.  Will need repeat labs in the next 1-2 months to reassess response to current treatment. Primary hypertension BP borderline today with no chest pain, chest pressure or headache  Needs to follow up on BP at next  appointments to ensure it decreases into normal range. Morbid obesity (HCC)  BMI 40.0-44.9, adult (HCC)    Diet: Grettell is currently in the action stage of change. As such, her goal is to continue with weight loss efforts and has agreed to keeping a food journal and adhering to recommended goals of 1700-1800 calories and 125 or more grams of  protein dialy.   Exercise:  For substantial health benefits, adults should do at least 150 minutes (2 hours and 30 minutes) a week of moderate-intensity, or 75 minutes (1 hour and 15 minutes) a week of vigorous-intensity aerobic physical activity, or an equivalent combination of moderate- and vigorous-intensity aerobic activity. Aerobic activity should be performed in episodes of at least 10 minutes, and preferably, it should be spread throughout the week.  Behavior Modification:  We discussed the following Behavioral Modification Strategies today: increasing lean protein intake, decreasing simple carbohydrates, meal planning and cooking strategies, holiday eating strategies, and planning for success.   Return in about 4 weeks (around 07/29/2024) for fasting labs.   She was informed of the importance of frequent follow up visits to maximize her success with intensive lifestyle modifications for her multiple health conditions.  Attestation Statements:   Reviewed by clinician on day of visit: allergies, medications, problem list, medical history, surgical history, family history, social history, and previous encounter notes.     Adelita Cho, MD

## 2024-07-01 NOTE — Assessment & Plan Note (Signed)
 BP borderline today with no chest pain, chest pressure or headache  Needs to follow up on BP at next appointments to ensure it decreases into normal range.

## 2024-07-01 NOTE — Assessment & Plan Note (Signed)
 Patient is working on limiting simple carbohydrates and increasing total protein intake.  Will need repeat labs in the next 1-2 months to reassess response to current treatment.

## 2024-07-24 ENCOUNTER — Other Ambulatory Visit: Payer: Self-pay | Admitting: Family

## 2024-07-24 DIAGNOSIS — I10 Essential (primary) hypertension: Secondary | ICD-10-CM

## 2024-07-26 ENCOUNTER — Ambulatory Visit: Admitting: Family

## 2024-07-30 ENCOUNTER — Encounter: Payer: Self-pay | Admitting: Family

## 2024-07-30 ENCOUNTER — Ambulatory Visit: Admitting: Family

## 2024-07-30 DIAGNOSIS — R7303 Prediabetes: Secondary | ICD-10-CM | POA: Diagnosis not present

## 2024-07-30 DIAGNOSIS — I10 Essential (primary) hypertension: Secondary | ICD-10-CM

## 2024-07-30 DIAGNOSIS — Z23 Encounter for immunization: Secondary | ICD-10-CM

## 2024-07-30 DIAGNOSIS — J45909 Unspecified asthma, uncomplicated: Secondary | ICD-10-CM

## 2024-07-30 MED ORDER — HYDRALAZINE HCL 25 MG PO TABS: 25.0000 mg | ORAL_TABLET | Freq: Three times a day (TID) | ORAL | 2 refills | Status: DC

## 2024-07-30 NOTE — Patient Instructions (Signed)
  VISIT SUMMARY: Today, we reviewed your current medications and overall health. We discussed your blood pressure, weight management, asthma, and prediabetes. We also administered flu and pneumonia vaccines and talked about the importance of vaccinations and screenings.  YOUR PLAN: -HYPERTENSION: Hypertension means high blood pressure, which can lead to serious health problems if not managed. Your blood pressure is still elevated, so we have added hydralazine  to your medications. We also ordered a renal artery ultrasound and a home sleep study to check for other contributing factors like renal artery stenosis and sleep apnea. Please provide your blood pressure reading from your December 4th appointment via MyChart.  -OBESITY: Obesity can contribute to high blood pressure and other health issues. Continue your weight management program with the clinic, as weight control is crucial for managing your blood pressure and overall health.  -ASTHMA: Asthma is a condition that affects your airways and can cause breathing difficulties. Your asthma is well-controlled with infrequent use of your inhaler, which is good news.  -PREDIABETES: Prediabetes means your blood sugar levels are higher than normal but not high enough to be classified as diabetes. Your A1c is in the high normal range, so continue to monitor your blood sugar levels.  -GENERAL HEALTH MAINTENANCE: We discussed the importance of vaccinations and screenings. Due to your asthma history, you are a candidate for flu and pneumonia vaccines, which were administered today. We will discuss the Gardasil vaccine at your next visit.  INSTRUCTIONS: Please complete the renal artery ultrasound and home sleep study as ordered. Provide your blood pressure reading from your December 4th appointment via MyChart. Continue with your weight management program and monitor your blood sugar levels. We will discuss the Gardasil vaccine at your next  visit.

## 2024-07-30 NOTE — Assessment & Plan Note (Signed)
 Lab Results  Component Value Date   HGBA1C 5.6 01/15/2024   Last A1C was WNL.

## 2024-07-30 NOTE — Assessment & Plan Note (Addendum)
 BP Readings from Last 3 Encounters:  07/30/24 (!) 142/80  07/01/24 (!) 140/84  05/23/24 (!) 142/81   Blood pressure remains elevated in the low 140s despite being on multiple bp agents. Discussed need to rule out potential contributing factors including renal artery stenosis and sleep apnea. Prolonged hypertension can affect kidney function. - Add hydralazine  twice daily. - Ordered renal artery ultrasound. - Ordered home sleep study to evaluate for sleep apnea. - Requested pt send me her blood pressure reading from December 4th appointment with weight loss clinic via MyChart.

## 2024-07-30 NOTE — Progress Notes (Signed)
 Subjective:     Patient ID: Amy Sutton, female    DOB: Jan 26, 1981, 43 y.o.   MRN: [id number]  No chief complaint on file.   HPI  Discussed the use of AI scribe software for clinical note transcription with the patient, who gave verbal consent to proceed.  History of Present Illness Amy Sutton is a 43 year old female with hypertension who presents for a follow-up on her medications.  She is currently taking amlodipine  10 mg, Diovan  HCT 320/25 mg, and carvedilol  for blood pressure management. She has no special concerns regarding her current medication regimen. There is a family history of difficult-to-control blood pressure.  She is working with a weight loss clinic and is on vitamin D  supplementation. Her asthma is well-controlled, with infrequent use of her inhaler, only once in the last four months during exercise in a hot environment.  Her A1c was checked in May and was in the high normal range, not in the diabetic range.  She reports being extremely busy at work, performing both her old and new job roles, which she feels contributes to her blood pressure. She grew up in [place]  and is unsure if she has received the Gardasil vaccine. No special concerns with current medications. Unsure about snoring, uses asthma inhaler infrequently.      Health Maintenance Due  Topic Date Due   HPV VACCINES (1 - 3-dose SCDM series) Never done    Past Medical History:  Diagnosis Date   Asthma    Bilateral swelling of feet    Heartburn    Hidradenitis    High blood pressure    Hypertension    Low back pain    Obesity    Prediabetes     Past Surgical History:  Procedure Laterality Date   Nexplanon      Inserted 11-2017    Family History  Problem Relation Age of Onset   Obesity Mother    Arthritis Mother    Hypertension Mother    Diabetes Mother    Hyperlipidemia Mother    Obesity Father    Hypertension Father    Diabetes Father    Hyperlipidemia Father     Asthma Son    Obesity Son    Cancer Maternal Uncle        prostate   Cancer Paternal Aunt        Lymphoma    Social History   Socioeconomic History   Marital status: Single    Spouse name: Not on file   Number of children: 1   Years of education: Not on file   Highest education level: Master's degree (e.g., MA, MS, MEng, MEd, MSW, MBA)  Occupational History    Employer: [employer]  Tobacco Use   Smoking status: Never   Smokeless tobacco: Never  Vaping Use   Vaping status: Never Used  Substance and Sexual Activity   Alcohol use: Not Currently   Drug use: No   Sexual activity: Yes    Partners: Male    Birth control/protection: I.U.D.    Comment: First IC >16 y/o, 5 Partners, Hx of TV  Other Topics Concern   Not on file  Social History Narrative   Regular exercise:  Not currently   Caffeine Use:  Sometimes   58 yr old son   Masters in Chief Of Staff   Lives with parents and son   Works as an environmental health practitioner for [employer]  Social Drivers of Corporate Investment Banker Strain: Low Risk  (07/23/2024)   Overall Financial Resource Strain (CARDIA)    Difficulty of Paying Living Expenses: Not hard at all  Food Insecurity: No Food Insecurity (07/23/2024)   Hunger Vital Sign    Worried About Running Out of Food in the Last Year: Never true    Ran Out of Food in the Last Year: Never true  Transportation Needs: No Transportation Needs (07/23/2024)   PRAPARE - Administrator, Civil Service (Medical): No    Lack of Transportation (Non-Medical): No  Physical Activity: Insufficiently Active (07/23/2024)   Exercise Vital Sign    Days of Exercise per Week: 3 days    Minutes of Exercise per Session: 40 min  Stress: No Stress Concern Present (07/23/2024)   [employer] of Occupational Health - Occupational Stress Questionnaire    Feeling of Stress: Not at all  Social Connections:  Moderately Isolated (07/23/2024)   Social Connection and Isolation Panel    Frequency of Communication with Friends and Family: Three times a week    Frequency of Social Gatherings with Friends and Family: Once a week    Attends Religious Services: More than 4 times per year    Active Member of [employer] or Organizations: No    Attends Engineer, Structural: Not on file    Marital Status: Divorced  Intimate Partner Violence: Not on file    Outpatient Medications Prior to Visit  Medication Sig Dispense Refill   albuterol  (VENTOLIN  HFA) 108 (90 Base) MCG/ACT inhaler 2 puffs every 6 hours as needed. 6.7 g 3   amLODipine  (NORVASC ) 10 MG tablet Take 1 tablet (10 mg total) by mouth daily. 90 tablet 1   carvedilol  (COREG ) 25 MG tablet TAKE 1 TABLET (25 MG TOTAL) BY MOUTH TWICE A DAY WITH MEALS 60 tablet 5   levonorgestrel  (MIRENA ) 20 MCG/DAY IUD 1 each by Intrauterine route once.     meloxicam  (MOBIC ) 7.5 MG tablet TAKE 1 TABLET (7.5 MG TOTAL) BY MOUTH DAILY AS NEEDED. FOR PAIN 30 tablet 5   Multiple Vitamin (MULTIVITAMIN ADULT PO) Take by mouth.     valsartan -hydrochlorothiazide  (DIOVAN -HCT) 320-25 MG tablet TAKE 1 TABLET BY MOUTH EVERY DAY 90 tablet 1   Vitamin D , Ergocalciferol , (DRISDOL ) 1.25 MG (50000 UNIT) CAPS capsule Take 1 capsule (50,000 Units total) by mouth every 7 (seven) days. 12 capsule 0   No facility-administered medications prior to visit.    No Known Allergies  ROS See HPI    Objective:    Physical Exam Constitutional:      General: She is not in acute distress.    Appearance: Normal appearance. She is well-developed.  HENT:     Head: Normocephalic and atraumatic.     Right Ear: External ear normal.     Left Ear: External ear normal.  Eyes:     General: No scleral icterus. Neck:     Thyroid : No thyromegaly.  Cardiovascular:     Rate and Rhythm: Normal rate and regular rhythm.     Heart sounds: Normal heart sounds. No murmur heard. Pulmonary:     Effort:  Pulmonary effort is normal. No respiratory distress.     Breath sounds: Normal breath sounds. No wheezing.  Musculoskeletal:     Cervical back: Neck supple.  Skin:    General: Skin is warm and dry.  Neurological:     Mental Status: She is alert and oriented to person, place, and time.  Psychiatric:        Mood and Affect: Mood normal.        Behavior: Behavior normal.        Thought Content: Thought content normal.        Judgment: Judgment normal.      BP (!) 142/80 (BP Location: Right Arm, Patient Position: Sitting, Cuff Size: Large)   Pulse 86   Temp 97.8 F (36.6 C) (Oral)   Resp 12   Ht 5' 8 (1.727 m)   Wt 299 lb 9.6 oz (135.9 kg)   SpO2 100%   BMI 45.55 kg/m  Wt Readings from Last 3 Encounters:  07/30/24 299 lb 9.6 oz (135.9 kg)  07/01/24 293 lb (132.9 kg)  05/23/24 299 lb (135.6 kg)       Assessment & Plan:   Problem List Items Addressed This Visit       Unprioritized   Prediabetes   Lab Results  Component Value Date   HGBA1C 5.6 01/15/2024   Last A1C was WNL.        HTN (hypertension)   BP Readings from Last 3 Encounters:  07/30/24 (!) 142/80  07/01/24 (!) 140/84  05/23/24 (!) 142/81   Blood pressure remains elevated in the low 140s despite being on multiple bp agents. Discussed need to rule out potential contributing factors including renal artery stenosis and sleep apnea. Prolonged hypertension can affect kidney function. - Add hydralazine  twice daily. - Ordered renal artery ultrasound. - Ordered home sleep study to evaluate for sleep apnea. - Requested pt send me her blood pressure reading from December 4th appointment with weight loss clinic via MyChart.        Relevant Medications   hydrALAZINE  (APRESOLINE ) 25 MG tablet   Other Relevant Orders   US  RENAL ARTERY DUPLEX COMPLETE   Asthma   Generally exercise induced.        Other Visit Diagnoses       Morbid obesity (HCC)    -  Primary   Relevant Orders   Home sleep test      Influenza vaccine administered       Relevant Orders   Flu vaccine trivalent PF, 6mos and older(Flulaval,Afluria,Fluarix,Fluzone) (Completed)     Need for pneumococcal 20-valent conjugate vaccination       Relevant Orders   Pneumococcal conjugate vaccine 20-valent (Prevnar 20) (Completed)      Discussed importance of vaccinations and screenings. Candidate for flu and pneumonia vaccines due to asthma history. Gardasil vaccine recommended up to age 38. - Administered flu vaccine. - Administered pneumonia vaccine. - Will discuss Gardasil vaccine at next visit. Assessment & Plan    I am having Emanii I. Ancona start on hydrALAZINE . I am also having her maintain her levonorgestrel , Multiple Vitamin (MULTIVITAMIN ADULT PO), amLODipine , albuterol , meloxicam , Vitamin D  (Ergocalciferol ), valsartan -hydrochlorothiazide , and carvedilol .  Meds ordered this encounter  Medications   hydrALAZINE  (APRESOLINE ) 25 MG tablet    Sig: Take 1 tablet (25 mg total) by mouth 3 (three) times daily.    Dispense:  60 tablet    Refill:  2    Supervising Provider:   DOMENICA BLACKBIRD A [[id number]]

## 2024-07-30 NOTE — Assessment & Plan Note (Signed)
 Generally exercise induced.

## 2024-08-06 ENCOUNTER — Ambulatory Visit

## 2024-08-06 NOTE — Addendum Note (Signed)
 Addended by: DARYL SETTER on: 08/06/2024 03:54 PM   Modules accepted: Orders

## 2024-08-08 ENCOUNTER — Encounter (INDEPENDENT_AMBULATORY_CARE_PROVIDER_SITE_OTHER): Payer: Self-pay | Admitting: Family Medicine

## 2024-08-08 ENCOUNTER — Ambulatory Visit (INDEPENDENT_AMBULATORY_CARE_PROVIDER_SITE_OTHER): Admitting: Family Medicine

## 2024-08-08 ENCOUNTER — Telehealth (INDEPENDENT_AMBULATORY_CARE_PROVIDER_SITE_OTHER): Admitting: Family Medicine

## 2024-08-08 ENCOUNTER — Encounter: Payer: Self-pay | Admitting: Family

## 2024-08-08 VITALS — BP 134/80 | HR 104 | Temp 97.6°F | Ht 68.0 in | Wt 287.0 lb

## 2024-08-08 DIAGNOSIS — E7800 Pure hypercholesterolemia, unspecified: Secondary | ICD-10-CM | POA: Diagnosis not present

## 2024-08-08 DIAGNOSIS — I10 Essential (primary) hypertension: Secondary | ICD-10-CM

## 2024-08-08 DIAGNOSIS — E559 Vitamin D deficiency, unspecified: Secondary | ICD-10-CM

## 2024-08-08 DIAGNOSIS — R7303 Prediabetes: Secondary | ICD-10-CM | POA: Diagnosis not present

## 2024-08-08 DIAGNOSIS — E669 Obesity, unspecified: Secondary | ICD-10-CM | POA: Diagnosis not present

## 2024-08-08 DIAGNOSIS — Z6841 Body Mass Index (BMI) 40.0 and over, adult: Secondary | ICD-10-CM | POA: Diagnosis not present

## 2024-08-08 NOTE — Progress Notes (Unsigned)
 TeleHealth Visit:   Amy Sutton has verbally consented to this audiovisual technology visit. The patient is located at the [facility] office, the provider is located at home. The participants in this visit include the listed provider and patient  The visit was conducted today via Training And Development Officer.  Chief Complaint: OBESITY Amy Sutton is here to discuss her progress with her obesity treatment plan along with follow-up of her obesity related diagnoses. Amy Sutton is on keeping a food journal and adhering to recommended goals of 1700-1800 calories and 125 grams of protein and states she is following her eating plan approximately 75% of the time. Amy Sutton states she is exercising 60 minutes 3 times per week.  Vitals Temp: 97.6 F (36.4 C) BP: 134/80 Pulse Rate: (!) 104 SpO2: 100 %   Anthropometric Measurements Height: 5' 8 (1.727 m) Weight: 287 lb (130.2 kg) BMI (Calculated): 43.65 Weight at Last Visit: 293 lb Weight Lost Since Last Visit: 6 Weight Gained Since Last Visit: 0 Starting Weight: 311 lb Total Weight Loss (lbs): 24 lb (10.9 kg)   Body Composition  Body Fat %: 50.4 % Fat Mass (lbs): 144.8 lbs Muscle Mass (lbs): 135.2 lbs Total Body Water (lbs): 103.2 lbs Visceral Fat Rating : 15     Subjective:  Patient had a good Thanksgiving and thinks she may have had too many carbohydrates on that day.  She did not eat much protein on the day of Thanksgiving. Rest of November went really well- she tracked protein and calories.  She was under her calories most days and only day she went over her calories were Thanksgiving day and the day after.  She was a bit under her protein consumption goal.  She was around 50-89 grams of protein daily.  Calories have been in 1100 range There are no diagnoses linked to this encounter. Assessment/Plan:   Assessment & Plan Primary hypertension Recent addition of hydralazine  to hypertension treatment.  Patient is now  on amlodipine , hydralazine , carvedilol , and diovan -hct.  BP today is controlled.  Will order CMP to check kidney  function test. Prediabetes  Vitamin D  deficiency  Pure hypercholesterolemia    There are no diagnoses linked to this encounter.  Amy Sutton is currently in the action stage of change. As such, her goal is to continue with weight loss efforts and has agreed to keeping a food journal and adhering to recommended goals of 1700-1800 calories and 125 or more grams of protein.   Exercise goals: For substantial health benefits, adults should do at least 150 minutes (2 hours and 30 minutes) a week of moderate-intensity, or 75 minutes (1 hour and 15 minutes) a week of vigorous-intensity aerobic physical activity, or an equivalent combination of moderate- and vigorous-intensity aerobic activity. Aerobic activity should be performed in episodes of at least 10 minutes, and preferably, it should be spread throughout the week.  Behavioral modification strategies: increasing lean protein intake, decreasing simple carbohydrates, no skipping meals, meal planning and cooking strategies, and keeping a strict food journal.  Amy Sutton has agreed to follow-up with our clinic in 5 weeks. She was informed of the importance of frequent follow-up visits to maximize her success with intensive lifestyle modifications for her multiple health conditions.   Objective:   VITALS: Per patient if applicable, see vitals. GENERAL: Alert and in no acute distress. CARDIOPULMONARY: No increased WOB. Speaking in clear sentences.  PSYCH: Pleasant and cooperative. Speech normal rate and rhythm. Affect is appropriate. Insight and judgement are appropriate. Attention is focused,  linear, and appropriate.  NEURO: Oriented as arrived to appointment on time with no prompting.   Lab Results  Component Value Date   CREATININE 0.77 01/15/2024   BUN 8 01/15/2024   NA 139 01/15/2024   K 4.4 01/15/2024   CL 102 01/15/2024   CO2  21 01/15/2024   Lab Results  Component Value Date   ALT 13 01/15/2024   AST 15 01/15/2024   ALKPHOS 81 01/15/2024   BILITOT 0.4 01/15/2024   Lab Results  Component Value Date   HGBA1C 5.6 01/15/2024   HGBA1C 5.6 08/18/2023   HGBA1C 5.4 10/21/2022   HGBA1C 5.4 02/21/2022   HGBA1C 5.7 06/11/2018   Lab Results  Component Value Date   INSULIN  30.7 (H) 01/15/2024   Lab Results  Component Value Date   TSH 1.700 01/15/2024   Lab Results  Component Value Date   CHOL 165 01/15/2024   HDL 42 01/15/2024   LDLCALC 108 (H) 01/15/2024   TRIG 80 01/15/2024   CHOLHDL 4 10/21/2022   Lab Results  Component Value Date   VD25OH 22.6 (L) 01/15/2024   Lab Results  Component Value Date   WBC 5.8 01/15/2024   HGB 13.3 01/15/2024   HCT 41.2 01/15/2024   MCV 84 01/15/2024   PLT 283 01/15/2024   No results found for: IRON, TIBC, FERRITIN  Attestation Statements:   Reviewed by clinician on day of visit: allergies, medications, problem list, medical history, surgical history, family history, social history, and previous encounter notes.    Adelita Cho, MD

## 2024-08-08 NOTE — Progress Notes (Unsigned)
 canceled

## 2024-08-08 NOTE — Assessment & Plan Note (Signed)
 Recent addition of hydralazine  to hypertension treatment.  Patient is now on amlodipine , hydralazine , carvedilol , and diovan -hct.  BP today is controlled.  Will order CMP to check kidney  function test.

## 2024-08-09 LAB — COMPREHENSIVE METABOLIC PANEL WITH GFR
ALT: 15 IU/L (ref 0–32)
AST: 14 IU/L (ref 0–40)
Albumin: 4.2 g/dL (ref 3.9–4.9)
Alkaline Phosphatase: 70 IU/L (ref 41–116)
BUN/Creatinine Ratio: 14 (ref 9–23)
BUN: 10 mg/dL (ref 6–24)
Bilirubin Total: 0.5 mg/dL (ref 0.0–1.2)
CO2: 22 mmol/L (ref 20–29)
Calcium: 9.7 mg/dL (ref 8.7–10.2)
Chloride: 102 mmol/L (ref 96–106)
Creatinine, Ser: 0.7 mg/dL (ref 0.57–1.00)
Globulin, Total: 2.9 g/dL (ref 1.5–4.5)
Glucose: 92 mg/dL (ref 70–99)
Potassium: 4.2 mmol/L (ref 3.5–5.2)
Sodium: 140 mmol/L (ref 134–144)
Total Protein: 7.1 g/dL (ref 6.0–8.5)
eGFR: 111 mL/min/1.73 (ref >59)

## 2024-08-09 LAB — LIPID PANEL WITH LDL/HDL RATIO
Cholesterol, Total: 171 mg/dL (ref 100–199)
HDL: 45 mg/dL (ref >39)
LDL Chol Calc (NIH): 110 mg/dL — ABNORMAL HIGH (ref 0–99)
LDL/HDL Ratio: 2.4 ratio (ref 0.0–3.2)
Triglycerides: 85 mg/dL (ref 0–149)
VLDL Cholesterol Cal: 16 mg/dL (ref 5–40)

## 2024-08-09 LAB — HEMOGLOBIN A1C
Est. average glucose Bld gHb Est-mCnc: 108 mg/dL
Hgb A1c MFr Bld: 5.4 % (ref 4.8–5.6)

## 2024-08-09 LAB — INSULIN, RANDOM: INSULIN: 30.6 u[IU]/mL — ABNORMAL HIGH (ref 2.6–24.9)

## 2024-08-09 LAB — VITAMIN D 25 HYDROXY (VIT D DEFICIENCY, FRACTURES): Vit D, 25-Hydroxy: 38.9 ng/mL (ref 30.0–100.0)

## 2024-08-19 NOTE — Assessment & Plan Note (Signed)
 Elevated A1c at last lab draw.  Repeat labs ordered today of CMP, A1c and insulin  level.  Will plan to discuss lab results at next appointment and patient was encouraged to get labs drawn prior to that appointment.

## 2024-08-19 NOTE — Assessment & Plan Note (Signed)
 Patient has been working on limiting saturated fat intake and needs a repeat fasting lipid panel to assess response to lifestyle changes.  Repeat fasting lipid panel ordered today.

## 2024-08-19 NOTE — Assessment & Plan Note (Signed)
 History of vitamin D  deficiency that patient has been taking supplementation for.  Vitamin D  level ordered for patient to get drawn at earliest convenience.

## 2024-08-22 ENCOUNTER — Other Ambulatory Visit (INDEPENDENT_AMBULATORY_CARE_PROVIDER_SITE_OTHER): Payer: Self-pay | Admitting: Family Medicine

## 2024-08-22 DIAGNOSIS — E559 Vitamin D deficiency, unspecified: Secondary | ICD-10-CM

## 2024-08-24 ENCOUNTER — Other Ambulatory Visit: Payer: Self-pay | Admitting: Family

## 2024-08-24 DIAGNOSIS — I10 Essential (primary) hypertension: Secondary | ICD-10-CM

## 2024-09-09 ENCOUNTER — Encounter (INDEPENDENT_AMBULATORY_CARE_PROVIDER_SITE_OTHER): Payer: Self-pay | Admitting: Family Medicine

## 2024-09-09 ENCOUNTER — Ambulatory Visit (INDEPENDENT_AMBULATORY_CARE_PROVIDER_SITE_OTHER): Payer: Self-pay | Admitting: Family Medicine

## 2024-09-09 VITALS — BP 141/78 | HR 83 | Temp 98.0°F | Ht 68.0 in | Wt 289.0 lb

## 2024-09-09 DIAGNOSIS — E559 Vitamin D deficiency, unspecified: Secondary | ICD-10-CM

## 2024-09-09 DIAGNOSIS — I10 Essential (primary) hypertension: Secondary | ICD-10-CM

## 2024-09-09 DIAGNOSIS — Z6841 Body Mass Index (BMI) 40.0 and over, adult: Secondary | ICD-10-CM | POA: Diagnosis not present

## 2024-09-09 DIAGNOSIS — R7303 Prediabetes: Secondary | ICD-10-CM | POA: Diagnosis not present

## 2024-09-09 DIAGNOSIS — E7800 Pure hypercholesterolemia, unspecified: Secondary | ICD-10-CM

## 2024-09-09 MED ORDER — VITAMIN D (ERGOCALCIFEROL) 1.25 MG (50000 UNIT) PO CAPS: 50000.0000 [IU] | ORAL_CAPSULE | ORAL | 0 refills | Status: AC

## 2024-09-09 NOTE — Assessment & Plan Note (Signed)
 Blood pressure slightly elevated today.  No chest pain, chest pressure or headache mentioned.  Blood pressures have been slightly elevated and patient has new medication added recently.  Struggling to take medication three times a day.  Follow up on BP at next appointment- patient to set up alarm to remind herself to take medication at lunch consistently.  Goal will be to monitor BP and then with control hope to decrease medication first with hydralazine  given frequency of administration.

## 2024-09-09 NOTE — Progress Notes (Signed)
 "  SUBJECTIVE:  Chief Complaint: Obesity  Interim History: Patient here for first follow up since the holidays.  She has since our last in office appointment gotten another blood pressure medication added.  She did not journal over the holidays.  She has restarted journaling today.  She wants to get back on track.  She is anticipating sugar cravings over the next few weeks.  Over the next month she does not have anything coming up.    Amy Sutton is here to discuss her progress with her obesity treatment plan. She is on the keeping a food journal and adhering to recommended goals of 1700-1800 calories and 125 grams of protein and states she is following her eating plan approximately 50 % of the time. She states she is exercising 60 minutes 2-3 times per week.   OBJECTIVE: Visit Diagnoses: Problem List Items Addressed This Visit       Cardiovascular and Mediastinum   HTN (hypertension) - Primary     Other   Prediabetes   Hyperlipidemia   The 10-year ASCVD risk score (Arnett DK, et al., 2019) is: 3.9%   Values used to calculate the score:     Age: 44 years     Clinically relevant sex: Female     Is Non-Hispanic African American: Yes     Diabetic: No     Tobacco smoker: No     Systolic Blood Pressure: 141 mmHg     Is BP treated: Yes     HDL Cholesterol: 45 mg/dL     Total Cholesterol: 171 mg/dL       Vitamin D  deficiency   Relevant Medications   Vitamin D , Ergocalciferol , (DRISDOL ) 1.25 MG (50000 UNIT) CAPS capsule   Other Visit Diagnoses       BMI 40.0-44.9, adult (HCC)         Morbid obesity (HCC)           Vitals Temp: 98 F (36.7 C) BP: (!) 141/78 Pulse Rate: 83 SpO2: 100 %   Anthropometric Measurements Height: 5' 8 (1.727 m) Weight: 289 lb (131.1 kg) BMI (Calculated): 43.95 Weight at Last Visit: 287 lb Weight Lost Since Last Visit: 0 Weight Gained Since Last Visit: 2 Starting Weight: 311 lb Total Weight Loss (lbs): 22 lb (9.979 kg)   Body  Composition  Body Fat %: 50.4 % Fat Mass (lbs): 146 lbs Muscle Mass (lbs): 136.4 lbs Total Body Water (lbs): 104 lbs   Other Clinical Data Fasting: no Labs: no Today's Visit #: 9 Starting Date: 01/15/24 Comments: [id number]     ASSESSMENT AND PLAN: Assessment & Plan Primary hypertension Blood pressure slightly elevated today.  No chest pain, chest pressure or headache mentioned.  Blood pressures have been slightly elevated and patient has new medication added recently.  Struggling to take medication three times a day.  Follow up on BP at next appointment- patient to set up alarm to remind herself to take medication at lunch consistently.  Goal will be to monitor BP and then with control hope to decrease medication first with hydralazine  given frequency of administration. Prediabetes Improved A1c on recent labs.  Insulin  remains elevated but this is expected.  Will repeat labs in April. Continue current meal plan. BMI 40.0-44.9, adult [facility])  Vitamin D  deficiency  Pure hypercholesterolemia The 10-year ASCVD risk score (Arnett DK, et al., 2019) is: 3.9%   Values used to calculate the score:     Age: 44 years     Clinically relevant sex: Female  Is Non-Hispanic African American: Yes     Diabetic: No     Tobacco smoker: No     Systolic Blood Pressure: 141 mmHg     Is BP treated: Yes     HDL Cholesterol: 45 mg/dL     Total Cholesterol: 171 mg/dL  Morbid obesity (HCC)    Diet: Amy Sutton is currently in the action stage of change. As such, her goal is to continue with weight loss efforts and has agreed to keeping a food journal and adhering to recommended goals of 1700-1800 calories and 125 or more grams of protein.   Exercise:  All adults should avoid inactivity. Some activity is better than none, and adults who participate in any amount of physical activity, gain some health benefits.  Behavior Modification:  We discussed the following Behavioral Modification  Strategies today: increasing lean protein intake, decreasing simple carbohydrates, meal planning and cooking strategies, and keep a strict food journal.   Return in about 3 weeks (around 09/30/2024).   She was informed of the importance of frequent follow up visits to maximize her success with intensive lifestyle modifications for her multiple health conditions.  Attestation Statements:   Reviewed by clinician on day of visit: allergies, medications, problem list, medical history, surgical history, family history, social history, and previous encounter notes.    Amy Cho, MD "

## 2024-09-09 NOTE — Assessment & Plan Note (Signed)
 Improved A1c on recent labs.  Insulin  remains elevated but this is expected.  Will repeat labs in April. Continue current meal plan.

## 2024-09-09 NOTE — Assessment & Plan Note (Addendum)
 The 10-year ASCVD risk score (Arnett DK, et al., 2019) is: 3.9%   Values used to calculate the score:     Age: 44 years     Clinically relevant sex: Female     Is Non-Hispanic African American: Yes     Diabetic: No     Tobacco smoker: No     Systolic Blood Pressure: 141 mmHg     Is BP treated: Yes     HDL Cholesterol: 45 mg/dL     Total Cholesterol: 171 mg/dL

## 2024-09-23 ENCOUNTER — Ambulatory Visit

## 2024-09-23 ENCOUNTER — Ambulatory Visit (HOSPITAL_COMMUNITY)

## 2024-09-23 ENCOUNTER — Ambulatory Visit (HOSPITAL_BASED_OUTPATIENT_CLINIC_OR_DEPARTMENT_OTHER)
Admission: RE | Admit: 2024-09-23 | Discharge: 2024-09-23 | Disposition: A | Discharge status: Home / Self Care | Source: Ambulatory Visit

## 2024-09-23 VITALS — BP 138/94 | Ht 68.0 in | Wt 289.0 lb

## 2024-09-23 DIAGNOSIS — M1711 Unilateral primary osteoarthritis, right knee: Secondary | ICD-10-CM | POA: Diagnosis not present

## 2024-09-23 DIAGNOSIS — M25551 Pain in right hip: Secondary | ICD-10-CM | POA: Diagnosis not present

## 2024-09-23 DIAGNOSIS — M79671 Pain in right foot: Secondary | ICD-10-CM

## 2024-09-23 DIAGNOSIS — M25561 Pain in right knee: Secondary | ICD-10-CM | POA: Diagnosis not present

## 2024-09-23 MED ORDER — NAPROXEN 500 MG PO TABS: 500.0000 mg | ORAL_TABLET | Freq: Two times a day (BID) | ORAL | 1 refills | Status: AC

## 2024-09-23 NOTE — Progress Notes (Signed)
 "  Subjective:    Patient ID: Amy Sutton, female    DOB: 44 y.o., October 25, 1980   MRN: [id number]  Chief Complaint: Right knee + Right Hip pain  Discussed the use of AI scribe software for clinical note transcription with the patient, who gave verbal consent to proceed.  History of Present Illness Amy Sutton is a 44 year old female with past medical history significant for prediabetes, bilateral knee osteoarthritis, asthma, chronic low back pain presenting for evaluation of right knee and right hip pain.  Patient reports that she has been experiencing right knee pain for the past 6 months.  Thinks that she may have jumped on it wrong while she was doing Zumba.  She has been engaged in treatment for approximately 2 years but does not of regular activity for her.  She has tried to increase the intensity of some of her activities to try and engage in weight loss and reduce strain on her overall joints.  Tries to generally to avoid jumping/running/high impact activities.  No prior knee surgery.  Had x-rays showing tricompartmental arthritis on 03/07/2024 with a prominent lateral patellar tilt.  Underwent corticosteroid injection at that time which provided approximately 1 month of relief.  Since then the pain has been still quite bothersome.  Unable to fully extend the knee.  No clicking/locking/catching though.  Using Tylenol and Voltaren  gel 1 time per day  Notes pain in her lateral right hip which seems to onset about 4 months ago.  No known inciting injury/event.  Never had this problem form.  No numbness/tingling associated with this.  No prior hip surgery.  Bothers her sometimes with standing.  Sometimes with getting into or out of a car.  Using Tylenol  Right foot pain has onset perhaps the past 2 months.  Slightly worse in the morning and gets better if she warms up throughout the day.  No numbness or tingling associated with this.  No known inciting event or injury.  No prior significant injury  or issue with this in the past.  No significant changes in shoewear recently.  Voltaren  gel helping minimally though only using this 1 time per day.   Review of Pertinent Imaging: 3 view plain film radiographs obtained of the right knee on 03/07/2024 per my independent review revealing Moderate lateral patellar tilt.  Mild osteophytic change noted at the inferior pole of the patella prominent osteophytosis noted along the lateral edge of the lateral compartment.  Mild to moderate joint space narrowing tricompartmentally.    Objective:   Vitals:   09/23/24 0754  BP: (!) 138/94   Right knee: Small effusion.  No tenderness to palpation along the medial or lateral joint lines.  Mild tenderness over the distal quad tendon.  Positive patellar grind.  Pain reproduced with medial and lateral translation of the patella.  5/5 strength with flexion/extension of the knee.  5 deg extension, 130 deg flexion Knee feels relatively unstable on single-leg squat though this does not reproduce pain  Right hip: Tenderness palpation of the greater trochanter. Nontender over the ASIS, AIIS, SI joints, pubic symphysis. Excellent strength with gluteus medius testing.  Right foot: Tenderness palpation two thirds distally of the length along the shaft of the fifth metatarsal.  No overlying skin changes. Pes planus.    Assessment & Plan:   Assessment & Plan  Right knee pain is interesting and that it appears that her right knee is slightly more enlarged than the left, cannot be fully extended, and she has  positive patellar tests today.  She also has a prominent valgus knee alignment at baseline.  I suspect this is contributing to an underlying patellofemoral pain syndrome in addition to patella chondromalacia.  We will start physical therapy for medial quad, can consider corticosteroid injection repeated.  Increase Voltaren  to 4X per day.  Her pain over her lateral right hip seems most consistent with greater  trochanteric pain syndrome, though her glute medius is quite strong.  I recommend shockwave therapy and we will follow-up next week to start this.  Could consider corticosteroid injection.  For her right foot pain, it appears to be present approximately two thirds of the way along the shaft of the fifth metatarsal.  There are no overlying skin changes or calluses present.  She is mildly tender in this area.  It is slightly worse with doing a calf raise.  I will start research with x-rays and consider ultrasound for follow-up if x-rays are normal. "

## 2024-09-23 NOTE — Addendum Note (Signed)
 Addended by: CHARLES ROGUE A on: 09/23/2024 08:56 AM   Modules accepted: Orders

## 2024-09-24 ENCOUNTER — Ambulatory Visit: Payer: Self-pay

## 2024-09-27 ENCOUNTER — Other Ambulatory Visit: Payer: Self-pay | Admitting: Family

## 2024-09-27 DIAGNOSIS — I10 Essential (primary) hypertension: Secondary | ICD-10-CM

## 2024-09-30 ENCOUNTER — Ambulatory Visit (HOSPITAL_COMMUNITY): Admission: RE | Admit: 2024-09-30 | Source: Ambulatory Visit

## 2024-10-03 ENCOUNTER — Ambulatory Visit

## 2024-10-07 ENCOUNTER — Ambulatory Visit (INDEPENDENT_AMBULATORY_CARE_PROVIDER_SITE_OTHER): Admitting: Family Medicine

## 2024-10-11 ENCOUNTER — Ambulatory Visit (HOSPITAL_COMMUNITY)

## 2024-10-14 ENCOUNTER — Ambulatory Visit (INDEPENDENT_AMBULATORY_CARE_PROVIDER_SITE_OTHER): Admitting: Family Medicine

## 2024-11-08 ENCOUNTER — Ambulatory Visit

## 2024-11-11 ENCOUNTER — Ambulatory Visit (HOSPITAL_COMMUNITY)

## 2024-11-27 ENCOUNTER — Ambulatory Visit: Admitting: Family

## 2024-12-03 ENCOUNTER — Ambulatory Visit: Admitting: Obstetrics and Gynecology
